# Patient Record
Sex: Male | Born: 1965 | Race: White | Hispanic: No | Smoking: Former smoker
Health system: Southern US, Community
[De-identification: ages and names within clinical notes are randomized; demographics above are authoritative.]

## PROBLEM LIST (undated history)

## (undated) DIAGNOSIS — F329 Major depressive disorder, single episode, unspecified: Secondary | ICD-10-CM

## (undated) DIAGNOSIS — E559 Vitamin D deficiency, unspecified: Secondary | ICD-10-CM

## (undated) DIAGNOSIS — K219 Gastro-esophageal reflux disease without esophagitis: Secondary | ICD-10-CM

## (undated) DIAGNOSIS — I10 Essential (primary) hypertension: Secondary | ICD-10-CM

## (undated) DIAGNOSIS — Z8719 Personal history of other diseases of the digestive system: Secondary | ICD-10-CM

## (undated) DIAGNOSIS — F32A Depression, unspecified: Secondary | ICD-10-CM

## (undated) DIAGNOSIS — N189 Chronic kidney disease, unspecified: Secondary | ICD-10-CM

## (undated) DIAGNOSIS — E785 Hyperlipidemia, unspecified: Secondary | ICD-10-CM

## (undated) DIAGNOSIS — K5900 Constipation, unspecified: Secondary | ICD-10-CM

## (undated) DIAGNOSIS — E119 Type 2 diabetes mellitus without complications: Secondary | ICD-10-CM

## (undated) DIAGNOSIS — J45909 Unspecified asthma, uncomplicated: Secondary | ICD-10-CM

## (undated) DIAGNOSIS — R809 Proteinuria, unspecified: Secondary | ICD-10-CM

## (undated) DIAGNOSIS — M109 Gout, unspecified: Secondary | ICD-10-CM

## (undated) DIAGNOSIS — R7303 Prediabetes: Secondary | ICD-10-CM

## (undated) DIAGNOSIS — R0602 Shortness of breath: Secondary | ICD-10-CM

## (undated) DIAGNOSIS — G4730 Sleep apnea, unspecified: Secondary | ICD-10-CM

## (undated) HISTORY — DX: Proteinuria, unspecified: R80.9

## (undated) HISTORY — DX: Hyperlipidemia, unspecified: E78.5

## (undated) HISTORY — PX: WISDOM TOOTH EXTRACTION: SHX21

## (undated) HISTORY — DX: Constipation, unspecified: K59.00

## (undated) HISTORY — DX: Gout, unspecified: M10.9

## (undated) HISTORY — DX: Depression, unspecified: F32.A

## (undated) HISTORY — DX: Personal history of other diseases of the digestive system: Z87.19

## (undated) HISTORY — DX: Major depressive disorder, single episode, unspecified: F32.9

## (undated) HISTORY — PX: POLYPECTOMY: SHX149

## (undated) HISTORY — DX: Essential (primary) hypertension: I10

## (undated) HISTORY — DX: Gastro-esophageal reflux disease without esophagitis: K21.9

## (undated) HISTORY — DX: Prediabetes: R73.03

## (undated) HISTORY — DX: Sleep apnea, unspecified: G47.30

## (undated) HISTORY — DX: Unspecified asthma, uncomplicated: J45.909

## (undated) HISTORY — DX: Type 2 diabetes mellitus without complications: E11.9

## (undated) HISTORY — DX: Vitamin D deficiency, unspecified: E55.9

## (undated) HISTORY — DX: Shortness of breath: R06.02

## (undated) HISTORY — DX: Chronic kidney disease, unspecified: N18.9

---

## 2003-10-13 DIAGNOSIS — R809 Proteinuria, unspecified: Secondary | ICD-10-CM

## 2003-10-13 HISTORY — DX: Proteinuria, unspecified: R80.9

## 2003-10-13 HISTORY — PX: RENAL BIOPSY: SHX156

## 2006-11-03 ENCOUNTER — Ambulatory Visit: Payer: Self-pay | Admitting: Internal Medicine

## 2006-11-03 LAB — CONVERTED CEMR LAB
Free T4: 0.8 ng/dL (ref 0.6–1.6)
Hgb A1c MFr Bld: 5.9 % (ref 4.6–6.0)
Potassium: 4.1 meq/L (ref 3.5–5.1)
T3, Free: 3 pg/mL (ref 2.3–4.2)

## 2007-04-19 ENCOUNTER — Ambulatory Visit: Payer: Self-pay | Admitting: Internal Medicine

## 2007-04-19 DIAGNOSIS — R350 Frequency of micturition: Secondary | ICD-10-CM | POA: Insufficient documentation

## 2007-04-19 DIAGNOSIS — K449 Diaphragmatic hernia without obstruction or gangrene: Secondary | ICD-10-CM | POA: Insufficient documentation

## 2007-04-22 ENCOUNTER — Encounter (INDEPENDENT_AMBULATORY_CARE_PROVIDER_SITE_OTHER): Payer: Self-pay | Admitting: *Deleted

## 2007-04-22 LAB — CONVERTED CEMR LAB
Creatinine, Ser: 1.5 mg/dL (ref 0.4–1.5)
Creatinine,U: 327.8 mg/dL

## 2008-03-29 ENCOUNTER — Ambulatory Visit: Payer: Self-pay | Admitting: Internal Medicine

## 2008-03-29 DIAGNOSIS — I10 Essential (primary) hypertension: Secondary | ICD-10-CM | POA: Insufficient documentation

## 2008-03-30 ENCOUNTER — Encounter (INDEPENDENT_AMBULATORY_CARE_PROVIDER_SITE_OTHER): Payer: Self-pay | Admitting: *Deleted

## 2008-10-09 ENCOUNTER — Ambulatory Visit: Payer: Self-pay | Admitting: Internal Medicine

## 2008-10-09 DIAGNOSIS — R809 Proteinuria, unspecified: Secondary | ICD-10-CM | POA: Insufficient documentation

## 2008-10-10 ENCOUNTER — Encounter (INDEPENDENT_AMBULATORY_CARE_PROVIDER_SITE_OTHER): Payer: Self-pay | Admitting: *Deleted

## 2008-10-10 ENCOUNTER — Encounter: Payer: Self-pay | Admitting: Internal Medicine

## 2008-10-10 ENCOUNTER — Telehealth (INDEPENDENT_AMBULATORY_CARE_PROVIDER_SITE_OTHER): Payer: Self-pay | Admitting: *Deleted

## 2008-10-10 LAB — CONVERTED CEMR LAB
BUN: 17 mg/dL (ref 6–23)
Hgb A1c MFr Bld: 5.9 % (ref 4.6–6.0)
Microalb Creat Ratio: 1309.6 mg/g — ABNORMAL HIGH (ref 0.0–30.0)
Microalb, Ur: 251.7 mg/dL — ABNORMAL HIGH (ref 0.0–1.9)

## 2008-10-16 ENCOUNTER — Encounter (INDEPENDENT_AMBULATORY_CARE_PROVIDER_SITE_OTHER): Payer: Self-pay | Admitting: *Deleted

## 2008-12-25 ENCOUNTER — Ambulatory Visit: Payer: Self-pay | Admitting: Internal Medicine

## 2009-01-10 ENCOUNTER — Ambulatory Visit: Payer: Self-pay | Admitting: Internal Medicine

## 2009-01-10 DIAGNOSIS — M109 Gout, unspecified: Secondary | ICD-10-CM | POA: Insufficient documentation

## 2009-01-15 ENCOUNTER — Encounter (INDEPENDENT_AMBULATORY_CARE_PROVIDER_SITE_OTHER): Payer: Self-pay | Admitting: *Deleted

## 2009-04-16 ENCOUNTER — Ambulatory Visit: Payer: Self-pay | Admitting: Internal Medicine

## 2009-04-16 ENCOUNTER — Ambulatory Visit: Payer: Self-pay

## 2009-04-16 DIAGNOSIS — S80929A Unspecified superficial injury of unspecified lower leg, initial encounter: Secondary | ICD-10-CM

## 2009-04-16 DIAGNOSIS — S70919A Unspecified superficial injury of unspecified hip, initial encounter: Secondary | ICD-10-CM | POA: Insufficient documentation

## 2009-04-16 DIAGNOSIS — S90919A Unspecified superficial injury of unspecified ankle, initial encounter: Secondary | ICD-10-CM

## 2009-04-16 DIAGNOSIS — S70929A Unspecified superficial injury of unspecified thigh, initial encounter: Secondary | ICD-10-CM

## 2009-04-18 ENCOUNTER — Encounter (INDEPENDENT_AMBULATORY_CARE_PROVIDER_SITE_OTHER): Payer: Self-pay | Admitting: *Deleted

## 2009-11-22 ENCOUNTER — Telehealth (INDEPENDENT_AMBULATORY_CARE_PROVIDER_SITE_OTHER): Payer: Self-pay | Admitting: *Deleted

## 2010-10-23 ENCOUNTER — Ambulatory Visit
Admission: RE | Admit: 2010-10-23 | Discharge: 2010-10-23 | Payer: Self-pay | Source: Home / Self Care | Attending: Internal Medicine | Admitting: Internal Medicine

## 2010-10-23 DIAGNOSIS — R3129 Other microscopic hematuria: Secondary | ICD-10-CM | POA: Insufficient documentation

## 2010-10-23 DIAGNOSIS — R109 Unspecified abdominal pain: Secondary | ICD-10-CM | POA: Insufficient documentation

## 2010-10-23 DIAGNOSIS — R361 Hematospermia: Secondary | ICD-10-CM | POA: Insufficient documentation

## 2010-10-23 LAB — CONVERTED CEMR LAB
Bilirubin Urine: NEGATIVE
Glucose, Urine, Semiquant: NEGATIVE
Ketones, urine, test strip: NEGATIVE
Nitrite: NEGATIVE
Protein, U semiquant: 100
Specific Gravity, Urine: 1.01
Urobilinogen, UA: 0.2
WBC Urine, dipstick: NEGATIVE
pH: 6

## 2010-10-24 ENCOUNTER — Encounter: Payer: Self-pay | Admitting: Internal Medicine

## 2010-10-27 ENCOUNTER — Telehealth: Payer: Self-pay | Admitting: Internal Medicine

## 2010-11-04 ENCOUNTER — Encounter: Payer: Self-pay | Admitting: Internal Medicine

## 2010-11-11 NOTE — Progress Notes (Signed)
Summary: Refill request  Phone Note Refill Request Call back at Work Phone [phone] Message from:  Patient  Refills Requested: Medication #1:  LISINOPRIL 40 MG TABS 1 qd  Medication #2:  AMLODIPINE BESYLATE 10 MG TABS (AMLODIPINE BESYLATE) 1 once daily. [facility]   Method Requested: Electronic Initial call taken by: Georgette Dover,  November 22, 2009 3:44 PM    Prescriptions: AMLODIPINE BESYLATE 10 MG TABS (AMLODIPINE BESYLATE) 1 once daily  #30 x 5   Entered by:   Georgette Dover   Authorized by:   Unice Cobble MD   Signed by:   Georgette Dover on 11/22/2009   Method used:   [facility]ed to ...       [facility]  [address] [phone]       [place], [place]  [address]       Ph: [phone] or [phone]       Fax: [phone]   RxID:   [id number] LISINOPRIL 40 MG TABS (LISINOPRIL) 1 qd  #30 x 5   Entered by:   Georgette Dover   Authorized by:   Unice Cobble MD   Signed by:   Georgette Dover on 11/22/2009   Method used:   [facility]ically [facility]        [facility]  [address] [id number]       [place], [place]  [address]       Ph: [phone] or [phone]       Fax: [phone]   RxID:   [id number]

## 2010-11-13 NOTE — Assessment & Plan Note (Signed)
Summary: HAVING  PAIN//PH   Vital Signs:  Patient profile:   45 year old male Weight:      212.2 pounds BMI:     30.13 Temp:     98.5 degrees F oral Pulse rate:   72 / minute Resp:     12 per minute BP sitting:   152 / 100  (left arm) Cuff size:   large  Vitals Entered By: Georgette Dover CMA (October 23, 2010 4:24 PM) CC: 1.) Pain in personal area and blood when ejaculating x couple days  2.) Refill Lisinopril, Abdominal pain   CC:  1.) Pain in personal area and blood when ejaculating x couple days  2.) Refill Lisinopril and Abdominal pain.  History of Present Illness:      This is a 45 year old man who presents with  onset of abdominal pain 3 days ago with eiaculation with frank  blood in semen. He had hematospermia several times in 2011, but  with much less blood.  The patient denies nausea, vomiting, diarrhea, constipation, melena, and hematochezia.  The location of the pain is right  and left lower quadrant.  The pain is described as  a dull  constant  pain , but  it was sharp with ejaculation.   Weight loss  of 20 # was with TLC ( [employer]). He also  has had slight  dysuria.  The patient denies the following symptoms: fever and dark urine.       See BP:  The patient reports urinary frequency, but denies lightheadedness, headaches, edema, and fatigue.  The patient denies the following associated symptoms: chest pain, chest pressure, exercise intolerance, dyspnea, palpitations, and syncope.  Compliance with medications (by patient report) has been near 100%.  The patient reports that dietary compliance has been excellent.  The patient reports exercising daily.  Adjunctive measures currently used by the patient include salt restriction.    Allergies (verified): No Known Drug Allergies  Review of Systems ENT:  Denies nosebleeds. Resp:  Denies coughing up blood. GU:  Denies discharge and hematuria. Heme:  Denies abnormal bruising and bleeding.  Physical Exam  General:   well-nourished,in no acute distress; alert,appropriate and cooperative throughout examination Eyes:  No corneal or conjunctival inflammation noted.No icterus Lungs:  Normal respiratory effort, chest expands symmetrically. Lungs are clear to auscultation, no crackles or wheezes. Heart:  Normal rate and regular rhythm. S1 and S2 normal without gallop, murmur, click, rub .S4 Abdomen:  Bowel sounds positive,abdomen soft and non-tender without masses, organomegaly or hernias noted. Rectal:  No external abnormalities noted. Normal sphincter tone. No rectal masses or tenderness. Genitalia:  Testes bilaterally descended without nodularity, tenderness or masses. No scrotal masses or lesions. No penis lesions or urethral discharge. Minor L varicocele.   Prostate:  Prostate gland firm and smooth, no enlargement, nodularity, tenderness, mass, asymmetry or induration. Pulses:  R and L carotid,radial,dorsalis pedis and posterior tibial pulses are full and equal bilaterally Extremities:  No clubbing, cyanosis, edema. Neurologic:  alert & oriented X3.   Skin:  Intact without suspicious lesions or rashes Cervical Nodes:  No lymphadenopathy noted Axillary Nodes:  No palpable lymphadenopathy Inguinal Nodes:  No significant adenopathy but tender bilaterally Psych:  memory intact for recent and remote, normally interactive, and good eye contact.     Impression & Recommendations:  Problem # 1:  ABDOMINAL PAIN (ICD-789.00)  BLQ  Orders: UA Dipstick w/o Micro (manual) [id number])  Problem # 2:  HEMATOSPERMIA [id number])  Orders:  UA Dipstick w/o Micro (manual) [phone]) Urology Referral (Urology)  Problem # 3:  HYPERTENSION, UNCONTROLLED (ICD-401.9)  His updated medication list for this problem includes:    Lisinopril 40 Mg Tabs (Lisinopril) .[name]... 1 qd    Amlodipine Besylate 5 Mg Tabs (Amlodipine besylate) .[name]... 1 once daily  Problem # 4:  FREQUENCY, URINARY (ICD-788.41)  Orders: UA Dipstick w/o Micro  (manual) (81002)  Problem # 5:  MICROSCOPIC HEMATURIA (ICD-599.72)  His updated medication list for this problem includes:    Ciprofloxacin Hcl 500 Mg Tabs (Ciprofloxacin hcl) .[name]... 1 two times a day  Orders: Urology Referral (Urology)  Complete Medication List: 1)  Fluoxetine Hcl 10 Mg Caps (Fluoxetine hcl) .[name].. 1 by mouth qd 2)  Advair Diskus 500-50 Mcg/dose Misc (Fluticasone-salmeterol) .[name].. 1 inhalation every 12 hrs 3)  Proair Hfa 108 (90 Base) Mcg/act Aers (Albuterol sulfate) .[name].. 1-2 puffs q 4-6 hrs prn 4)  Lisinopril 40 Mg Tabs (Lisinopril) .[name].. 1 qd 5)  Amlodipine Besylate 10 Mg Tabs (amlodipine Besylate)  .[name].. 1 once daily 6)  Ciprofloxacin Hcl 500 Mg Tabs (Ciprofloxacin hcl) .[name].. 1 two times a day 7)  Amlodipine Besylate 5 Mg Tabs (Amlodipine besylate) .[name].. 1 once daily  Other Orders: T-Culture, Urine [id number])  Patient Instructions: 1)  Drink as much fluid as you can tolerate for the next few days. 2)  Check your Blood Pressure regularly. If it is above: 135/85 ON AVERAGE  you should make an appointment. Prescriptions: AMLODIPINE BESYLATE 5 MG TABS (AMLODIPINE BESYLATE) 1 once daily  #30 x 5   Entered and Authorized by:   Unice Cobble MD   Signed by:   Unice Cobble MD on 10/23/2010   Method used:   Print then Give to Patient   RxID:   [id number] CIPROFLOXACIN HCL 500 MG TABS (CIPROFLOXACIN HCL) 1 two times a day  #14 x 0   Entered and Authorized by:   Unice Cobble MD   Signed by:   Unice Cobble MD on 10/23/2010   Method used:   Print then Give to Patient   RxID:   [id number] LISINOPRIL 40 MG TABS (LISINOPRIL) 1 qd  #90 x 3   Entered and Authorized by:   Unice Cobble MD   Signed by:   Unice Cobble MD on 10/23/2010   Method used:   Print then Give to Patient   RxID:   [id number]    Orders Added: 1)  Est. Patient Level IV [id number] 2)  UA Dipstick w/o Micro (manual) [81002] 3)  Urology Referral [Urology] 4)  T-Culture, Urine  [id number]    Laboratory Results   Urine Tests    Routine Urinalysis   Color: yellow Appearance: Clear Glucose: negative   (Normal Range: Negative) Bilirubin: negative   (Normal Range: Negative) Ketone: negative   (Normal Range: Negative) Spec. Gravity: 1.010   (Normal Range: 1.003-1.035) Blood: moderate   (Normal Range: Negative) pH: 6.0   (Normal Range: 5.0-8.0) Protein: 100   (Normal Range: Negative) Urobilinogen: 0.2   (Normal Range: 0-1) Nitrite: negative   (Normal Range: Negative) Leukocyte Esterace: negative   (Normal Range: Negative)    Comments: sent for culture

## 2010-11-13 NOTE — Progress Notes (Signed)
Summary: Culture Results/Status update  Phone Note Outgoing Call Call back at Home Phone [phone]   Call placed by: Georgette Dover CMA,  October 27, 2010 11:29 AM Call placed to: Patient Details for Reason: Culture Results Summary of Call: Left message on machine for patient to return call when avaliable, Reason for call:   no UTI is present; status update please [place]  October 27, 2010 11:30 AM   Follow-up for Phone Call        Left message on machine for patient to return call when avaliable, Reason for call:   Culture results Follow-up by: Georgette Dover CMA,  October 28, 2010 3:20 PM  Additional Follow-up for Phone Call Additional follow up Details #1::        Left message on machine for patient to return call when avaliable, Reason for call: Additional Follow-up by: Georgette Dover CMA,  October 29, 2010 8:02 AM    Additional Follow-up for Phone Call Additional follow up Details #2::    Returned call to patient [phone], left message on voicemail to call back to office. Also left message on machine at home to call back to office. Ernestene Mention CMA  October 29, 2010 3:35 PM   Patient notified and he is currently awaiting appt info from referral. Symptoms are present but at less intensity. Ernestene Mention CMA  October 29, 2010 3:38 PM   Additional Follow-up for Phone Call Additional follow up Details #3:: Details for Additional Follow-up Action Taken: Urology consult ordered & pending Additional Follow-up by: Unice Cobble MD,  October 30, 2010 6:23 AM

## 2010-11-27 NOTE — Consult Note (Signed)
Summary: [facility]  [facility]   Imported By: Edmonia James 11/17/2010 14:10:22  _____________________________________________________________________  External Attachment:    Type:   Image     Comment:   External Document

## 2011-05-11 ENCOUNTER — Other Ambulatory Visit: Payer: Self-pay | Admitting: Internal Medicine

## 2011-05-11 ENCOUNTER — Encounter: Payer: Self-pay | Admitting: Internal Medicine

## 2011-05-11 ENCOUNTER — Ambulatory Visit (HOSPITAL_BASED_OUTPATIENT_CLINIC_OR_DEPARTMENT_OTHER)
Admission: RE | Admit: 2011-05-11 | Discharge: 2011-05-11 | Disposition: A | Discharge status: Home / Self Care | Payer: BC Managed Care – PPO | Source: Ambulatory Visit | Attending: Internal Medicine | Admitting: Internal Medicine

## 2011-05-11 ENCOUNTER — Ambulatory Visit (INDEPENDENT_AMBULATORY_CARE_PROVIDER_SITE_OTHER): Payer: BC Managed Care – PPO | Admitting: Internal Medicine

## 2011-05-11 VITALS — BP 132/98 | HR 70 | Temp 99.3°F | Wt 218.6 lb

## 2011-05-11 DIAGNOSIS — M542 Cervicalgia: Secondary | ICD-10-CM | POA: Insufficient documentation

## 2011-05-11 DIAGNOSIS — M5412 Radiculopathy, cervical region: Secondary | ICD-10-CM

## 2011-05-11 DIAGNOSIS — M47812 Spondylosis without myelopathy or radiculopathy, cervical region: Secondary | ICD-10-CM | POA: Insufficient documentation

## 2011-05-11 DIAGNOSIS — R209 Unspecified disturbances of skin sensation: Secondary | ICD-10-CM | POA: Insufficient documentation

## 2011-05-11 DIAGNOSIS — M25519 Pain in unspecified shoulder: Secondary | ICD-10-CM

## 2011-05-11 MED ORDER — GABAPENTIN 100 MG PO CAPS: 100.0000 mg | ORAL_CAPSULE | ORAL | Status: DC

## 2011-05-11 MED ORDER — HYDROCODONE-ACETAMINOPHEN 7.5-500 MG PO TABS: 1.0000 | ORAL_TABLET | ORAL | Status: AC | PRN

## 2011-05-11 NOTE — Patient Instructions (Signed)
Order for x-rays entered into  the computer; these will be performed at [facility] [address] facility. No appointment is necessary.

## 2011-05-11 NOTE — Progress Notes (Signed)
  Subjective:    Patient ID: Frederick Fox, male    DOB: 1966/06/09, 45 y.o.   MRN: [id number]  HPI Extremity pain Location:L shoulder blade Onset:1 week ago Trigger/injury:NO Pain quality:burning , throbbing Pain severity:up to 10 Duration:constant Radiation:to 4th & 5th digits: Exacerbating factors:worse supine  Treatment/response:NSAIDS, hydrocodone only helped sleep Review of systems: Constitutional: no fever ( but see TEMP today), chills, sweats, change in weight  Musculoskeletal:no  muscle cramps or pain; no  joint stiffness, redness, or swelling Skin:no rash, color change Neuro: no :weakness; incontinence (stool/urine); numbness. Tingling in noted distribution Heme:no lymphadenopathy; abnormal bruising or bleeding       Review of Systems     Objective:   Physical Exam Gen.: Healthy and well-nourished in appearance. Alert, appropriate and cooperative throughout exam. Head: Normocephalic without obvious abnormalities Eyes: No corneal or conjunctival inflammation noted. Pupils equal round reactive to light and accommodation. Field of Vision WNL.  Extraocular motion intact.  Neck: No deformities, masses, or tenderness noted. Range of motion normal.  Lungs: Normal respiratory effort; chest expands symmetrically. Lungs are clear to auscultation without rales, wheezes, or increased work of breathing. Heart: Normal rate and rhythm. Normal S1 and S2. No gallop, click, or rub. S4 ; no  murmur.                                                                                    Musculoskeletal/extremities: No deformity or scoliosis noted of  the thoracic or lumbar spine but minimal asymmetry of thoracic muscles No clubbing, cyanosis, edema, or deformity noted. Range of motion decreased LUE due to pain with elevation .Tone & strength essentially  Normal;? Minimal weakness L 5th digit to opposition.Joints normal. Nail health  good. Vascular: Carotid, radial artery  pulses are full and  equal. No bruits present. Neurologic: Alert and oriented x3. Deep tendon reflexes symmetrical  But 0-1/2 +. Light touch is normal over the face and upper extremities. Gait(heel and toe), Romberg testing and finger-nose are all normal.          Skin: Intact without suspicious lesions or rashes. Lymph: No cervical, axillary  lymphadenopathy present. Psych: Mood and affect are normal. Normally interactive                                                                                        Assessment & Plan:  #1 cervical radiculopathy suggested on the left without demonstrable neurovascular deficit of significance  Plan: #1 cervical spine films  #2 gabapentin and pain medications.

## 2011-05-15 ENCOUNTER — Telehealth: Payer: Self-pay | Admitting: *Deleted

## 2011-05-15 NOTE — Telephone Encounter (Signed)
Message left for patient to return my call.  

## 2011-05-15 NOTE — Telephone Encounter (Signed)
Pt called and would like results from his Xray. States he is still having pain in shoulder area, difficult to sleep. Please advise.

## 2011-05-15 NOTE — Telephone Encounter (Signed)
Message copied by Charlies Silvers on Fri May 15, 2011 10:44 AM ------      Message from: Hendricks Limes      Created: Fri May 15, 2011 10:15 AM       Boney spuring is  present without evidence of narrowing the canal through which the nerve has no instability noted with flexion or extension of the neck. Physical therapy or chiropractor  recommended if symptoms persist or progress.

## 2011-05-15 NOTE — Telephone Encounter (Signed)
Spoke w/ pt. 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

## 2011-05-18 ENCOUNTER — Telehealth: Payer: Self-pay

## 2011-05-18 NOTE — Telephone Encounter (Signed)
Mssg from patient stating he needed to get the results of his X-ray since he is going to the Chirpractor this morning. I tried returning patient's call.Frederick KitchenMarland KitchenMssg left on cell      KP

## 2011-05-28 ENCOUNTER — Telehealth: Payer: Self-pay | Admitting: Internal Medicine

## 2011-05-28 DIAGNOSIS — M5412 Radiculopathy, cervical region: Secondary | ICD-10-CM

## 2011-05-28 NOTE — Telephone Encounter (Signed)
Pt returned phone call tried calling pt unable to reach left msg for pt to return call.

## 2011-05-28 NOTE — Telephone Encounter (Signed)
Pt called would like refill on hydrocodone last filled on 05/11/11 says he is still w/ arm pain and having trouble sleeping at night would like to know if Dr. Linna Darner has any further recommendations

## 2011-05-28 NOTE — Telephone Encounter (Signed)
Refill of hydrocodone #30 okay. He stated was going to see a Restaurant manager, [employer]. If he is no better with chiropractry,I recommend neurosurgical referral.

## 2011-05-28 NOTE — Telephone Encounter (Signed)
Left msg on voicemail for pt to return call .

## 2011-05-29 MED ORDER — HYDROCODONE-ACETAMINOPHEN 7.5-500 MG PO TABS: 1.0000 | ORAL_TABLET | ORAL | Status: AC | PRN

## 2011-05-29 NOTE — Telephone Encounter (Signed)
Spoke w/ pt says that arm isn't any better still w/ pain he also has been seeing a chiropractor with no improvement. Ok w/ neurosurgical referral aware med sent to pharmacy.

## 2011-06-09 NOTE — Telephone Encounter (Signed)
X-ray no longer needed, patient already seen    KP

## 2011-10-30 ENCOUNTER — Other Ambulatory Visit: Payer: Self-pay | Admitting: Internal Medicine

## 2011-10-30 MED ORDER — AMLODIPINE BESYLATE 10 MG PO TABS: 10.0000 mg | ORAL_TABLET | Freq: Every day | ORAL | Status: DC

## 2011-10-30 MED ORDER — LISINOPRIL 40 MG PO TABS: 40.0000 mg | ORAL_TABLET | Freq: Every day | ORAL | Status: DC

## 2011-10-30 NOTE — Telephone Encounter (Signed)
Rxs sent

## 2012-04-07 ENCOUNTER — Other Ambulatory Visit: Payer: Self-pay | Admitting: Neurosurgery

## 2012-04-07 DIAGNOSIS — M542 Cervicalgia: Secondary | ICD-10-CM

## 2012-04-13 ENCOUNTER — Ambulatory Visit
Admission: RE | Admit: 2012-04-13 | Discharge: 2012-04-13 | Disposition: A | Discharge status: Home / Self Care | Payer: BC Managed Care – PPO | Source: Ambulatory Visit | Attending: Neurosurgery | Admitting: Neurosurgery

## 2012-04-13 VITALS — BP 137/89 | HR 66

## 2012-04-13 DIAGNOSIS — M542 Cervicalgia: Secondary | ICD-10-CM

## 2012-04-13 MED ORDER — MEPERIDINE HCL 100 MG/ML IJ SOLN
75.0000 mg | Freq: Once | INTRAMUSCULAR | Status: AC
  Administered 2012-04-13: 75 mg via INTRAMUSCULAR

## 2012-04-13 MED ORDER — DIAZEPAM 5 MG PO TABS
10.0000 mg | ORAL_TABLET | Freq: Once | ORAL | Status: AC
  Administered 2012-04-13: 10 mg via ORAL

## 2012-04-13 MED ORDER — IOHEXOL 300 MG/ML  SOLN
10.0000 mL | Freq: Once | INTRAMUSCULAR | Status: AC | PRN
  Administered 2012-04-13: 10 mL via INTRATHECAL

## 2012-04-13 MED ORDER — ONDANSETRON HCL 4 MG/2ML IJ SOLN
4.0000 mg | Freq: Once | INTRAMUSCULAR | Status: AC
  Administered 2012-04-13: 4 mg via INTRAMUSCULAR

## 2012-04-13 NOTE — Discharge Instructions (Signed)

## 2012-04-13 NOTE — Progress Notes (Signed)
Pt c/o of back and leg pain and meds were given

## 2012-04-18 ENCOUNTER — Telehealth: Payer: Self-pay | Admitting: Radiology

## 2012-04-18 NOTE — Telephone Encounter (Signed)
Pt c/o some pressure in back of his head and slight nausea. Asked pt to stay down and drink lots of fluids today and that should take care of this. Pt did not c/o positional headache at this time.

## 2012-10-14 ENCOUNTER — Encounter: Payer: Self-pay | Admitting: Lab

## 2012-10-17 ENCOUNTER — Ambulatory Visit (INDEPENDENT_AMBULATORY_CARE_PROVIDER_SITE_OTHER): Payer: BC Managed Care – PPO | Admitting: Internal Medicine

## 2012-10-17 ENCOUNTER — Encounter: Payer: Self-pay | Admitting: Internal Medicine

## 2012-10-17 VITALS — BP 144/98 | HR 98 | Temp 98.7°F | Wt 237.6 lb

## 2012-10-17 DIAGNOSIS — T464X5A Adverse effect of angiotensin-converting-enzyme inhibitors, initial encounter: Secondary | ICD-10-CM

## 2012-10-17 DIAGNOSIS — R058 Other specified cough: Secondary | ICD-10-CM

## 2012-10-17 DIAGNOSIS — R05 Cough: Secondary | ICD-10-CM

## 2012-10-17 DIAGNOSIS — R059 Cough, unspecified: Secondary | ICD-10-CM

## 2012-10-17 DIAGNOSIS — T44905A Adverse effect of unspecified drugs primarily affecting the autonomic nervous system, initial encounter: Secondary | ICD-10-CM

## 2012-10-17 DIAGNOSIS — I10 Essential (primary) hypertension: Secondary | ICD-10-CM

## 2012-10-17 MED ORDER — LOSARTAN POTASSIUM 100 MG PO TABS: 100.0000 mg | ORAL_TABLET | Freq: Every day | ORAL | Status: DC

## 2012-10-17 NOTE — Progress Notes (Signed)
  Subjective:    Patient ID: Frederick Fox, male    DOB: 1966-05-25, 47 y.o.   MRN: [id number]  HPI The  symptoms began pre Thanksgiving as sore throat , fever & dry cough . The cough has persisted Cough is not associated with shortness of breath but there is some wheezing Treatment with  Delsym, albuterol  & Coricidin HP were partially effective.  There is  history of asthma. The patient  Smokes a rare cigar    Review of Systems  Symptoms not present included frontal headache, facial pain, dental pain,  nasal purulence, earache , and otic discharge Itchy , watery eyes & sneezing were not noted. No GERD symptoms present                    Objective:   Physical Exam General appearance:well nourished; no acute distress or increased work of breathing is present.  No  lymphadenopathy about the head, neck, or axilla noted.  Eyes: No conjunctival inflammation or lid edema is present.  Ears:  External ear exam shows no significant lesions or deformities.  Otoscopic examination reveals wax on  R. Nose:  External nasal examination shows no deformity or inflammation. Nasal mucosa are pink and moist without lesions or exudates. No septal dislocation or deviation.No obstruction to airflow.  Oral exam: Dental hygiene is good; lips and gums are healthy appearing.There is no oropharyngeal erythema or exudate noted.  Neck:  No deformities, thyromegaly, masses, or tenderness noted.   Supple with full range of motion without pain.  Heart:  Normal rate and regular rhythm. S1 and S2 normal without gallop, murmur, click, rub or other extra sounds.  Lungs:Chest clear to auscultation; no wheezes, rhonchi,rales ,or rubs present.No increased work of breathing.   Extremities:  No cyanosis or clubbing  noted . Trace ankle edema Skin: Warm & dry         Assessment & Plan:  #1 cough , probably ACE-I induced #2 HTN Plan: See orders and recommendations

## 2012-10-17 NOTE — Patient Instructions (Addendum)
Stay well hydrated. Drink to thirst up to 32 ounces of fluids daily.  Blood Pressure Goal = AVERAGE < 140/90; Ideal is an AVERAGE < 135/85. This AVERAGE should be calculated from @ least 5-7 BP readings taken @ different times of day on different days of week. You should not respond to isolated BP readings , but rather the AVERAGE for that week

## 2012-12-19 ENCOUNTER — Ambulatory Visit (INDEPENDENT_AMBULATORY_CARE_PROVIDER_SITE_OTHER): Payer: BC Managed Care – PPO | Admitting: Internal Medicine

## 2012-12-19 ENCOUNTER — Encounter: Payer: Self-pay | Admitting: Internal Medicine

## 2012-12-19 VITALS — BP 148/90 | HR 99 | Temp 98.1°F | Wt 229.0 lb

## 2012-12-19 DIAGNOSIS — L738 Other specified follicular disorders: Secondary | ICD-10-CM

## 2012-12-19 DIAGNOSIS — N529 Male erectile dysfunction, unspecified: Secondary | ICD-10-CM

## 2012-12-19 DIAGNOSIS — L739 Follicular disorder, unspecified: Secondary | ICD-10-CM

## 2012-12-19 DIAGNOSIS — L678 Other hair color and hair shaft abnormalities: Secondary | ICD-10-CM

## 2012-12-19 LAB — HEMOGLOBIN A1C: Hgb A1c MFr Bld: 5.7 % (ref 4.6–6.5)

## 2012-12-19 MED ORDER — DOXYCYCLINE HYCLATE 100 MG PO TABS: 100.0000 mg | ORAL_TABLET | Freq: Two times a day (BID) | ORAL | Status: DC

## 2012-12-19 MED ORDER — SILDENAFIL CITRATE 100 MG PO TABS: 50.0000 mg | ORAL_TABLET | Freq: Every day | ORAL | Status: DC | PRN

## 2012-12-19 NOTE — Progress Notes (Signed)
  Subjective:    Patient ID: Frederick Fox, male    DOB: [birth year], 47 y.o.   MRN: [id number]  HPI  Over the last 2 weeks he's had diffuse papular/pustular lesions over his anterior thighs and to a lesser extent over the dorsum of the hands. He's had no definite pet or travel exposures of significance. He denies associated fever, chills, or sweats. There is no personal family history of MRSA    Review of Systems He recently has noted erectile dysfunction without associated decreased libido or profound weakness. He was questioning relationship to medications. He is on ARB and a calcium channel blocker which is a vasodilator.  BP @ home averages 136-140/86-90.     Objective:   Physical Exam   He appears healthy and well-nourished  Has no lymphadenopathy about the neck, axilla, or inguinal areas  Abdomen reveals no organomegaly or masses  Genital exam unremarkable except for varices on the left.  He has diffuse folliculitis type lesions over the anterior thigh and minimally over the dorsum of the hands       Assessment & Plan:  #1 folliculitis type lesions  #2 erectile dysfunction with no decrease in libido or strength  Plan: See orders recommendations

## 2012-12-19 NOTE — Patient Instructions (Addendum)
If you activate the  My Chart system; lab & Xray results will be released directly  to you as soon as I review & address these through the computer. As per [place] all records must be reviewed and signed off within 72 hours; but I attempt to complete this within 36 hours unless I have no access to the electronic medical record.If I wait more than 36 hours the volume of reports becomes difficult to manage optimally. In my absence ;my partners would address the results.Critical lab results will be communicated to you ASAP. If you choose not to sign up for My Chart within 36 hours of labs being drawn; results will be reviewed & interpretation added before being copied & mailed, causing a delay in getting the results to you.If you do not receive that report within 7-10 days ,please call. Additionally you can use this system to gain direct  access to your records  if  out of town or @ an office of a  physician who is not in  the My Chart network.  This improves continuity of care & places you in control of your medical record.

## 2012-12-26 ENCOUNTER — Other Ambulatory Visit: Payer: Self-pay | Admitting: Internal Medicine

## 2012-12-26 DIAGNOSIS — N529 Male erectile dysfunction, unspecified: Secondary | ICD-10-CM

## 2012-12-27 MED ORDER — SILDENAFIL CITRATE 100 MG PO TABS: 50.0000 mg | ORAL_TABLET | Freq: Every day | ORAL | Status: DC | PRN

## 2013-04-24 ENCOUNTER — Encounter: Payer: Self-pay | Admitting: Internal Medicine

## 2013-04-24 ENCOUNTER — Ambulatory Visit (INDEPENDENT_AMBULATORY_CARE_PROVIDER_SITE_OTHER): Payer: BC Managed Care – PPO | Admitting: Internal Medicine

## 2013-04-24 VITALS — BP 150/100 | HR 90 | Temp 98.1°F | Wt 216.0 lb

## 2013-04-24 DIAGNOSIS — N4281 Prostatodynia syndrome: Secondary | ICD-10-CM

## 2013-04-24 DIAGNOSIS — N4289 Other specified disorders of prostate: Secondary | ICD-10-CM

## 2013-04-24 DIAGNOSIS — R319 Hematuria, unspecified: Secondary | ICD-10-CM

## 2013-04-24 DIAGNOSIS — R300 Dysuria: Secondary | ICD-10-CM

## 2013-04-24 LAB — CBC WITH DIFFERENTIAL/PLATELET
Eosinophils Relative: 1.5 % (ref 0.0–5.0)
Monocytes Relative: 6.5 % (ref 3.0–12.0)
Neutrophils Relative %: 73 % (ref 43.0–77.0)
Platelets: 226 10*3/uL (ref 150.0–400.0)
WBC: 10.1 10*3/uL (ref 4.5–10.5)

## 2013-04-24 LAB — PSA: PSA: 1.15 ng/mL (ref 0.10–4.00)

## 2013-04-24 MED ORDER — CIPROFLOXACIN HCL 500 MG PO TABS: 500.0000 mg | ORAL_TABLET | Freq: Two times a day (BID) | ORAL | Status: DC

## 2013-04-24 NOTE — Progress Notes (Signed)
  Subjective:    Patient ID: Frederick Fox, male    DOB: Jul 31, 1966, 47 y.o.   MRN: [id number]  HPI   Symptoms began 04/22/13 as pain on intercourse with associated dysuria and hematuria.  Since 7/12 he's continued to have minor fissure area and intermittent scant hematuria.  He has no history of genitourinary issues.  He's had no fever, chills, sweats,  or pyuria. Weight loss is related to and exercise and nutrition program    Review of Systems  He denies epistaxis, hemoptysis,  melena, or rectal bleeding.He has no  dysphagia, or abdominal pain. He has no abnormal bruising or bleeding. He has no difficulty stopping bleeding with injury.      Objective:   Physical Exam General appearance is one of good health and nourishment w/o distress.  Eyes: No conjunctival inflammation or scleral icterus is present.  Oral exam: Dental hygiene is good; lips and gums are healthy appearing.There is no oropharyngeal erythema or exudate noted.   Heart:  Normal rate and regular rhythm. S1 and S2 normal without gallop, murmur, click, rub or other extra sounds     Lungs:Chest clear to auscultation; no wheezes, rhonchi,rales ,or rubs present.No increased work of breathing.   Abdomen: bowel sounds normal, soft and non-tender without masses, organomegaly or hernias noted.  No guarding or rebound . No discomfort with percussion over the flanks  Skin:Warm & dry.  Intact without suspicious lesions or rashes ; no jaundice or tenting  Lymphatic: No lymphadenopathy is noted about the head, neck, axilla, or inguinal areas.   Genitourinary exam reveals variceal on the left and epididymal granuloma formation. Prostate is firm and tender to palpation            Assessment & Plan:  #1 hematuria and dysuria following intercourse  #2 clinical prostatitis  Plan: See orders and recommendations

## 2013-04-24 NOTE — Patient Instructions (Addendum)
Drink as much nondairy fluids as possible. Avoid spicy foods or alcohol as  these may aggravate the prostate. Do not take decongestants. Avoid narcotics if possible.

## 2013-04-27 LAB — POCT URINALYSIS DIPSTICK
Bilirubin, UA: NEGATIVE
Glucose, UA: NEGATIVE
Nitrite, UA: NEGATIVE
Spec Grav, UA: 1.015
Urobilinogen, UA: 0.2

## 2013-08-17 ENCOUNTER — Encounter: Payer: Self-pay | Admitting: Internal Medicine

## 2013-08-17 ENCOUNTER — Other Ambulatory Visit: Payer: Self-pay | Admitting: Internal Medicine

## 2013-08-17 ENCOUNTER — Other Ambulatory Visit: Payer: Self-pay | Admitting: *Deleted

## 2013-08-17 ENCOUNTER — Other Ambulatory Visit: Payer: Self-pay

## 2013-08-17 MED ORDER — AMLODIPINE BESYLATE 10 MG PO TABS: ORAL_TABLET | ORAL | Status: DC

## 2013-08-17 NOTE — Telephone Encounter (Signed)
Amlodipine refill sent to pharmacy 

## 2013-08-22 ENCOUNTER — Encounter: Payer: Self-pay | Admitting: Internal Medicine

## 2013-08-22 ENCOUNTER — Ambulatory Visit (INDEPENDENT_AMBULATORY_CARE_PROVIDER_SITE_OTHER): Payer: BC Managed Care – PPO | Admitting: Internal Medicine

## 2013-08-22 ENCOUNTER — Other Ambulatory Visit: Payer: Self-pay | Admitting: Internal Medicine

## 2013-08-22 VITALS — BP 162/110 | HR 84 | Temp 98.5°F | Wt 205.6 lb

## 2013-08-22 DIAGNOSIS — F329 Major depressive disorder, single episode, unspecified: Secondary | ICD-10-CM

## 2013-08-22 DIAGNOSIS — I10 Essential (primary) hypertension: Secondary | ICD-10-CM

## 2013-08-22 DIAGNOSIS — F32A Depression, unspecified: Secondary | ICD-10-CM | POA: Insufficient documentation

## 2013-08-22 DIAGNOSIS — F339 Major depressive disorder, recurrent, unspecified: Secondary | ICD-10-CM | POA: Insufficient documentation

## 2013-08-22 DIAGNOSIS — R7989 Other specified abnormal findings of blood chemistry: Secondary | ICD-10-CM | POA: Insufficient documentation

## 2013-08-22 DIAGNOSIS — F3289 Other specified depressive episodes: Secondary | ICD-10-CM

## 2013-08-22 LAB — BASIC METABOLIC PANEL
CO2: 30 mEq/L (ref 19–32)
Chloride: 100 mEq/L (ref 96–112)
Glucose, Bld: 96 mg/dL (ref 70–99)
Potassium: 4.1 mEq/L (ref 3.5–5.1)
Sodium: 137 mEq/L (ref 135–145)

## 2013-08-22 MED ORDER — SPIRONOLACTONE 25 MG PO TABS: 25.0000 mg | ORAL_TABLET | Freq: Every day | ORAL | Status: DC

## 2013-08-22 MED ORDER — METOPROLOL TARTRATE 25 MG PO TABS: 25.0000 mg | ORAL_TABLET | Freq: Two times a day (BID) | ORAL | Status: DC

## 2013-08-22 NOTE — Assessment & Plan Note (Addendum)
Renin levels ; EKG; BMET Add Spironolactone if average> 140/90. HCTZ not an option due to gout hx

## 2013-08-22 NOTE — Progress Notes (Signed)
Pre visit review using our clinic review tool, if applicable. No additional management support is needed unless otherwise documented below in the visit note. 

## 2013-08-22 NOTE — Assessment & Plan Note (Signed)
As per Psychiatrist All options reviewed

## 2013-08-22 NOTE — Progress Notes (Signed)
  Subjective:    Patient ID: Frederick Fox, male    DOB: 08-20-1966, 47 y.o.   MRN: [id number]  HPI Blood pressure range 130/80-209/160 over past month. Typical average 130/80.The 209 was recorded via wrist cuff @ Psychiatrist's office. He denies any triggers such as increased salt intake or decongestant ingestion.  Compliant with both anti hypertemsive medications. No lightheadedness or other adverse medication effect described.        Review of Systems Significant headaches, epistaxis, chest pain, palpitations, exertional dyspnea, claudication, paroxysmal nocturnal dyspnea, or edema absent. He has occasional occipital discomfort from cervical spine issues  which responds to Tylenol.  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      Objective:   Physical Exam   Gen.: well-nourished in appearance. Alert, appropriate and cooperative throughout exam.Appears younger than stated age  Head: Normocephalic without obvious abnormalities. Head shaven Eyes: No corneal or conjunctival inflammation noted. Pupils equal round reactive to light and accommodation. Extraocular motion intact. Fundal exam reveals arteriolar narrowing without hemorrhages, exudate, papilledema.  No lid lag or proptosis Neck: No deformities, masses, or tenderness noted. Range of motion & Thyroid normal. Lungs: Normal respiratory effort; chest expands symmetrically. Lungs are clear to auscultation without rales, wheezes, or increased work of breathing. Heart: Normal rate and rhythm. Normal S1 and S2. No gallop, click, or rub. No murmur. Abdomen: Bowel sounds normal; abdomen soft and nontender. No masses, organomegaly or hernias noted. No AAA                                Musculoskeletal/extremities: No deformity or scoliosis noted of  the thoracic or lumbar spine.    No clubbing, cyanosis, edema, or significant extremity  deformity noted. Range of motion normal .Tone & strength normal. Hand joints normal .  Fingernail  health good. No onycholysis Able to lie down & sit up w/o help.  Vascular: Carotid, radial artery, dorsalis pedis and  posterior tibial pulses are full and equal. No bruits present. Neurologic: Alert and oriented x3. Deep tendon reflexes symmetrical but 1/2+. Slight asymmetry of the nasolabial folds. No cranial nerve deficit. No tremor     Skin: Intact without suspicious lesions or rashes. Lymph: No cervical, axillary lymphadenopathy present. Psych: Mood and affect are normal. Normally interactive  and communicative                                                                                     Assessment & Plan:  See Current Assessment & Plan in Problem List under specific Diagnosis

## 2013-08-22 NOTE — Patient Instructions (Signed)

## 2013-08-26 LAB — ALDOSTERONE + RENIN ACTIVITY W/ RATIO
ALDO / PRA Ratio: 0.9 Ratio (ref 0.9–28.9)
Aldosterone: 7 ng/dL

## 2013-08-27 ENCOUNTER — Other Ambulatory Visit: Payer: Self-pay | Admitting: Internal Medicine

## 2013-08-27 DIAGNOSIS — I10 Essential (primary) hypertension: Secondary | ICD-10-CM

## 2013-08-28 ENCOUNTER — Other Ambulatory Visit: Payer: Self-pay | Admitting: *Deleted

## 2013-08-28 DIAGNOSIS — R809 Proteinuria, unspecified: Secondary | ICD-10-CM

## 2013-09-20 ENCOUNTER — Inpatient Hospital Stay: Admission: RE | Admit: 2013-09-20 | Payer: BC Managed Care – PPO | Source: Ambulatory Visit

## 2013-09-20 ENCOUNTER — Other Ambulatory Visit: Payer: BC Managed Care – PPO

## 2013-11-07 ENCOUNTER — Ambulatory Visit (INDEPENDENT_AMBULATORY_CARE_PROVIDER_SITE_OTHER): Payer: BC Managed Care – PPO | Admitting: Internal Medicine

## 2013-11-07 ENCOUNTER — Encounter: Payer: Self-pay | Admitting: Internal Medicine

## 2013-11-07 VITALS — BP 186/111 | HR 69 | Temp 98.7°F | Wt 216.4 lb

## 2013-11-07 DIAGNOSIS — M7710 Lateral epicondylitis, unspecified elbow: Secondary | ICD-10-CM

## 2013-11-07 DIAGNOSIS — L659 Nonscarring hair loss, unspecified: Secondary | ICD-10-CM

## 2013-11-07 DIAGNOSIS — G44309 Post-traumatic headache, unspecified, not intractable: Secondary | ICD-10-CM

## 2013-11-07 DIAGNOSIS — I10 Essential (primary) hypertension: Secondary | ICD-10-CM

## 2013-11-07 MED ORDER — TRAMADOL HCL 50 MG PO TABS: 50.0000 mg | ORAL_TABLET | Freq: Four times a day (QID) | ORAL | Status: DC | PRN

## 2013-11-07 NOTE — Progress Notes (Signed)
   Subjective:    Patient ID: Frederick Fox, male    DOB: 1965-12-08, 48 y.o.   MRN: [id number]  HPI  He was struck over the anterior crown 11/05/13 at approximately 10 AM by a piece of furniture, specifically a leaf from the table. There was no loss of consciousness. He took 2 ibuprofen at that time and again that evening. He continues to have intermittent dull anterior frontal pain reesponsive to NSAIDS  2 every 6 hrs.  He has had nausea 1/26 & lightheadedness today.  BP 130/80 @ home. PMH of mgraines    Review of Systems    He's had no associated loss of consciousness since the event or seizure activity.  He has no neurologic deficits such as limb numbness, tingling, weakness  He also denies any incontinence of urine or stool.   He has loss of L forearm hair.TSH therapeutic in 11/14.  For 2 weeks he has pain L elbow lifting cases of wine.     Objective:   Physical Exam Gen.: Healthy and well-nourished in appearance. Alert, appropriate and cooperative throughout exam.Appears younger than stated age  Head: Normocephalic without obvious abnormalities;  alopecia  Eyes: No corneal or conjunctival inflammation noted. Pupils equal round reactive to light and accommodation. Extraocular motion intact. FOV Ears: External  ear exam reveals no significant lesions or deformities. Wax bilaterally. Hearing is grossly normal bilaterally.Tuning fork exam normal Nose: External nasal exam reveals no deformity or inflammation. Nasal mucosa are pink and moist. No lesions or exudates noted.   Mouth: Oral mucosa and oropharynx reveal no lesions or exudates. Teeth in good repair. Neck: No deformities, masses, or tenderness noted. Range of motion & Thyroid normal. Lungs: Normal respiratory effort; chest expands symmetrically. Lungs are clear to auscultation without rales, wheezes, or increased work of breathing.BP 160/80. Heart: Normal rate and rhythm. Normal S1 and S2. No gallop, click, or rub. No  murmur.Recheck BP 160/80.                                  Musculoskeletal/extremities: No deformity or scoliosis noted of  the thoracic or lumbar spine.   No clubbing, cyanosis, edema, or significant extremity  deformity noted. Range of motion normal .Tone & strength normal. Hand joints normal . Fingernail  health good. Classic thenar cellulitis of the left elbow exhibited with hand grip and pronation.  Vascular: Carotid, radial artery pulses WNL. Neurologic: Alert and oriented x3. Deep tendon reflexes symmetrical and normal.  Gait normal  .   No cranial nerve deficit present.     Skin: Intact without suspicious lesions or rashes.  Over the forearms appears essentially normal. Lymph: No cervical, axillaryl lymphadenopathy present. Psych: Mood and affect are normal. Normally interactive                                                                                          Assessment & Plan:  #1 daily headaches #2 post head trauma #3 elbow tenosynovitis  #4 hair loss left forearm, questionable etiology  See orders

## 2013-11-07 NOTE — Progress Notes (Signed)
Pre visit review using our clinic review tool, if applicable. No additional management support is needed unless otherwise documented below in the visit note. 

## 2013-11-07 NOTE — Patient Instructions (Signed)
Please keep a diary of your headaches . Document  each occurrence on the calendar with notation of : #1 any prodrome ( any non headache symptom such as marked fatigue,visual changes, ,etc ) which precedes actual headache ; #2) severity on 1-10 scale; #3) any triggers ( food/ drink,enviromenntal or weather changes ,physical or emotional stress) in 8-12 hour period prior to the headache; & #4) response to any medications or other intervention. Please review "Headache" @ [facility] for additional information.   Minimal Blood Pressure Goal= AVERAGE < 140/90;  Ideal is an AVERAGE < 135/85. This AVERAGE should be calculated from @ least 5-7 BP readings taken @ different times of day on different days of week. You should not respond to isolated BP readings , but rather the AVERAGE for that week .Please bring your  blood pressure cuff to office visits to verify that it is reliable.It  can also be checked against the blood pressure device at the pharmacy. Finger or wrist cuffs are not dependable; an arm cuff is. Perform the exercises for elbow pain  twice a day as discussed. Apply the anti-inflammatory gel after the exercises such as Aspercreme or Zostrix cream twice a day to the affected area as needed. In lieu of this warm moist compresses or  hot water bottle can be used. Do not apply ice .

## 2013-11-08 ENCOUNTER — Telehealth: Payer: Self-pay | Admitting: Internal Medicine

## 2013-11-08 NOTE — Telephone Encounter (Signed)
Relevant patient education assigned to patient using Emmi. ° °

## 2013-11-14 ENCOUNTER — Telehealth: Payer: Self-pay | Admitting: Internal Medicine

## 2013-11-14 NOTE — Telephone Encounter (Signed)
Relevant patient education assigned to patient using Emmi. ° °

## 2014-02-12 ENCOUNTER — Ambulatory Visit: Payer: BC Managed Care – PPO | Admitting: Internal Medicine

## 2014-02-12 DIAGNOSIS — R4689 Other symptoms and signs involving appearance and behavior: Secondary | ICD-10-CM | POA: Insufficient documentation

## 2014-02-12 DIAGNOSIS — Z0289 Encounter for other administrative examinations: Secondary | ICD-10-CM

## 2014-08-06 ENCOUNTER — Encounter: Payer: Self-pay | Admitting: Family

## 2014-08-06 ENCOUNTER — Ambulatory Visit (INDEPENDENT_AMBULATORY_CARE_PROVIDER_SITE_OTHER): Payer: BC Managed Care – PPO | Admitting: Family

## 2014-08-06 VITALS — BP 200/110 | HR 71 | Temp 98.4°F | Resp 18 | Ht 70.0 in | Wt 216.6 lb

## 2014-08-06 DIAGNOSIS — I10 Essential (primary) hypertension: Secondary | ICD-10-CM | POA: Diagnosis not present

## 2014-08-06 DIAGNOSIS — J209 Acute bronchitis, unspecified: Secondary | ICD-10-CM | POA: Insufficient documentation

## 2014-08-06 MED ORDER — LOSARTAN POTASSIUM 100 MG PO TABS: 100.0000 mg | ORAL_TABLET | Freq: Every day | ORAL | Status: DC

## 2014-08-06 MED ORDER — METOPROLOL TARTRATE 25 MG PO TABS: 25.0000 mg | ORAL_TABLET | Freq: Two times a day (BID) | ORAL | Status: DC

## 2014-08-06 MED ORDER — AMLODIPINE BESYLATE 10 MG PO TABS: ORAL_TABLET | ORAL | Status: DC

## 2014-08-06 MED ORDER — HYDROCODONE-HOMATROPINE 5-1.5 MG/5ML PO SYRP: 5.0000 mL | ORAL_SOLUTION | Freq: Three times a day (TID) | ORAL | Status: DC | PRN

## 2014-08-06 MED ORDER — AZITHROMYCIN 250 MG PO TABS: ORAL_TABLET | ORAL | Status: DC

## 2014-08-06 NOTE — Patient Instructions (Signed)
Thank you for choosing [facility].  Summary/Instructions:   Please take your blood pressure medicaitons  Your prescription has been sent to your pharmacy.  Please follow up if symptoms worsen or fail to improve.   Acute Bronchitis Bronchitis is inflammation of the airways that extend from the windpipe into the lungs (bronchi). The inflammation often causes mucus to develop. This leads to a cough, which is the most common symptom of bronchitis.  In acute bronchitis, the condition usually develops suddenly and goes away over time, usually in a couple weeks. Smoking, allergies, and asthma can make bronchitis worse. Repeated episodes of bronchitis may cause further lung problems.  CAUSES Acute bronchitis is most often caused by the same virus that causes a cold. The virus can spread from person to person (contagious) through coughing, sneezing, and touching contaminated objects. SIGNS AND SYMPTOMS   Cough.   Fever.   Coughing up mucus.   Body aches.   Chest congestion.   Chills.   Shortness of breath.   Sore throat.  DIAGNOSIS  Acute bronchitis is usually diagnosed through a physical exam. Your health care provider will also ask you questions about your medical history. Tests, such as chest X-rays, are sometimes done to rule out other conditions.  TREATMENT  Acute bronchitis usually goes away in a couple weeks. Oftentimes, no medical treatment is necessary. Medicines are sometimes given for relief of fever or cough. Antibiotic medicines are usually not needed but may be prescribed in certain situations. In some cases, an inhaler may be recommended to help reduce shortness of breath and control the cough. A cool mist vaporizer may also be used to help thin bronchial secretions and make it easier to clear the chest.  HOME CARE INSTRUCTIONS  Get plenty of rest.   Drink enough fluids to keep your urine clear or pale yellow (unless you have a medical condition that  requires fluid restriction). Increasing fluids may help thin your respiratory secretions (sputum) and reduce chest congestion, and it will prevent dehydration.   Take medicines only as directed by your health care provider.  If you were prescribed an antibiotic medicine, finish it all even if you start to feel better.  Avoid smoking and secondhand smoke. Exposure to cigarette smoke or irritating chemicals will make bronchitis worse. If you are a smoker, consider using nicotine gum or skin patches to help control withdrawal symptoms. Quitting smoking will help your lungs heal faster.   Reduce the chances of another bout of acute bronchitis by washing your hands frequently, avoiding people with cold symptoms, and trying not to touch your hands to your mouth, nose, or eyes.   Keep all follow-up visits as directed by your health care provider.  SEEK MEDICAL CARE IF: Your symptoms do not improve after 1 week of treatment.  SEEK IMMEDIATE MEDICAL CARE IF:  You develop an increased fever or chills.   You have chest pain.   You have severe shortness of breath.  You have bloody sputum.   You develop dehydration.  You faint or repeatedly feel like you are going to pass out.  You develop repeated vomiting.  You develop a severe headache. MAKE SURE YOU:   Understand these instructions.  Will watch your condition.  Will get help right away if you are not doing well or get worse. Document Released: 11/05/2004 Document Revised: 02/12/2014 Document Reviewed: 03/21/2013 [facility] Patient Information 2015 [place], [place]. This information is not intended to replace advice given to you by your health care  provider. Make sure you discuss any questions you have with your health care provider.

## 2014-08-06 NOTE — Progress Notes (Signed)
   Subjective:    Patient ID: Frederick Fox, male    DOB: [birth year], 48 y.o.   MRN: [id number]  Chief Complaint  Patient presents with  . Cough    x3 weeks, chest pain from cough   HPI:  Frederick Fox is a 48 y.o. male who presents today for a cough.   1) Cough - Been going on for about 3 weeks with episodes that wax and wane. Chest and back hurt from coughing. Has had productive cough at times and some general congestion. Tried OTC Robotussin, but has not taken any Mucinex. Over the past 3 weeks the cough has stayed about the same. Denies any fevers, chills, sore throat or sinus pressure.   Allergies  Allergen Reactions  . Lisinopril     cough   Current Outpatient Prescriptions on File Prior to Visit  Medication Sig Dispense Refill  . albuterol (PROAIR HFA) 108 (90 BASE) MCG/ACT inhaler Inhale 1-2 puffs into the lungs every 6 (six) hours as needed.        [name] escitalopram (LEXAPRO) 20 MG tablet Take 20 mg by mouth daily.      . Fluticasone-Salmeterol (ADVAIR DISKUS) 500-50 MCG/DOSE AEPB Inhale 1 puff into the lungs every 12 (twelve) hours.        . traMADol (ULTRAM) 50 MG tablet Take 1 tablet (50 mg total) by mouth every 6 (six) hours as needed.  30 tablet  1   No current facility-administered medications on file prior to visit.   Review of Systems    See HPI Objective:    BP 208/130  Pulse 71  Temp(Src) 98.4 F (36.9 C) (Oral)  Resp 18  Ht 5\' 10"  (1.778 m)  Wt 216 lb 9.6 oz (98.249 kg)  BMI 31.08 kg/m2  SpO2 98% Nursing note and vital signs reviewed.  Physical Exam  Constitutional: He is oriented to person, place, and time. He appears well-developed and well-nourished. No distress.  HENT:  Right Ear: Hearing and external ear normal.  Left Ear: Hearing, tympanic membrane, external ear and ear canal normal.  Nose: Nose normal.  Mouth/Throat: Uvula is midline and mucous membranes are normal. Posterior oropharyngeal erythema present.  Right TM not visible  secondary to impacted cerumen.   Cardiovascular: Normal rate, regular rhythm and normal heart sounds.   Pulmonary/Chest: Effort normal. He has wheezes (Occasional - has and uses his albuterol).  Lymphadenopathy:    He has cervical adenopathy.  Neurological: He is alert and oriented to person, place, and time.  Skin: Skin is warm and dry.  Psychiatric: He has a normal mood and affect. His behavior is normal. Judgment and thought content normal.       Assessment & Plan:

## 2014-08-06 NOTE — Assessment & Plan Note (Addendum)
Symptoms consistent with bronchitis. Given small amount of lymphadenopathy, will start Azithromycin. Start Hycodan for cough suppresion. Continue drinking plenty of fluids - use OTC medications as needed. Instructed to avoid all products with pseudoephedrine or -D. Follow if symptoms worsen or fail to improve.

## 2014-08-06 NOTE — Progress Notes (Signed)
Pre visit review using our clinic review tool, if applicable. No additional management support is needed unless otherwise documented below in the visit note. 

## 2014-08-06 NOTE — Assessment & Plan Note (Signed)
Currently not taking his blood pressure medications. Discussed the importance of taking medication to reduce risk for cardiovascular disease, stroke, renal impairment and visual impairment. Refilled amlodipine, metoprolol and losartan. Follow up in 2 weeks to establish blood pressure control.

## 2015-01-24 ENCOUNTER — Encounter: Payer: Self-pay | Admitting: Internal Medicine

## 2015-01-29 ENCOUNTER — Encounter: Payer: Self-pay | Admitting: Internal Medicine

## 2015-01-29 ENCOUNTER — Ambulatory Visit (INDEPENDENT_AMBULATORY_CARE_PROVIDER_SITE_OTHER): Payer: BC Managed Care – PPO | Admitting: Internal Medicine

## 2015-01-29 ENCOUNTER — Other Ambulatory Visit (INDEPENDENT_AMBULATORY_CARE_PROVIDER_SITE_OTHER): Payer: BC Managed Care – PPO

## 2015-01-29 VITALS — BP 170/110 | HR 82 | Temp 98.4°F | Ht 70.0 in | Wt 217.0 lb

## 2015-01-29 DIAGNOSIS — F329 Major depressive disorder, single episode, unspecified: Secondary | ICD-10-CM

## 2015-01-29 DIAGNOSIS — R748 Abnormal levels of other serum enzymes: Secondary | ICD-10-CM | POA: Diagnosis not present

## 2015-01-29 DIAGNOSIS — F32A Depression, unspecified: Secondary | ICD-10-CM

## 2015-01-29 DIAGNOSIS — Z87448 Personal history of other diseases of urinary system: Secondary | ICD-10-CM

## 2015-01-29 DIAGNOSIS — R7989 Other specified abnormal findings of blood chemistry: Secondary | ICD-10-CM

## 2015-01-29 DIAGNOSIS — I10 Essential (primary) hypertension: Secondary | ICD-10-CM

## 2015-01-29 LAB — BASIC METABOLIC PANEL
BUN: 27 mg/dL — ABNORMAL HIGH (ref 6–23)
CALCIUM: 9.1 mg/dL (ref 8.4–10.5)
CHLORIDE: 102 meq/L (ref 96–112)
CO2: 33 meq/L — AB (ref 19–32)
Creatinine, Ser: 2.31 mg/dL — ABNORMAL HIGH (ref 0.40–1.50)
GFR: 32.31 mL/min — ABNORMAL LOW (ref >60.00)
GLUCOSE: 114 mg/dL — AB (ref 70–99)
POTASSIUM: 3.9 meq/L (ref 3.5–5.1)
SODIUM: 139 meq/L (ref 135–145)

## 2015-01-29 MED ORDER — FLUOXETINE HCL 20 MG PO TABS: 20.0000 mg | ORAL_TABLET | Freq: Every day | ORAL | Status: DC

## 2015-01-29 MED ORDER — LOSARTAN POTASSIUM 100 MG PO TABS: 100.0000 mg | ORAL_TABLET | Freq: Every day | ORAL | Status: DC

## 2015-01-29 MED ORDER — METOPROLOL TARTRATE 25 MG PO TABS: 25.0000 mg | ORAL_TABLET | Freq: Two times a day (BID) | ORAL | Status: DC

## 2015-01-29 NOTE — Progress Notes (Signed)
Pre visit review using our clinic review tool, if applicable. No additional management support is needed unless otherwise documented below in the visit note. 

## 2015-01-29 NOTE — Patient Instructions (Signed)
Minimal Blood Pressure Goal= AVERAGE < 140/90;  Ideal is an AVERAGE < 135/85. This AVERAGE should be calculated from @ least 5-7 BP readings taken @ different times of day on different days of week. You should not respond to isolated BP readings , but rather the AVERAGE for that week .Please bring your  blood pressure cuff to office visits to verify that it is reliable.It  can also be checked against the blood pressure device at the pharmacy. Finger or wrist cuffs are not dependable; an arm cuff is. Your next office appointment will be determined based upon review of your pending labs . Those instructions will be transmitted to you by My Chart Critical results will be called.  Followup as needed for any active or acute issue. Please report any significant change in your symptoms.

## 2015-01-29 NOTE — Progress Notes (Signed)
   Subjective:    Patient ID: Frederick Fox, male    DOB: 01-01-1966, 49 y.o.   MRN: [id number]  HPI He describes increasing irritability associated with some anxiety and difficulty triaging. He feels he's having some depression as well. He wanted to restart fluoxetine which had been of benefit in the past  He states that he has been compliant with his amlodipine as well as losartan. Blood pressure at home averages 160/90. He does restrict sodium. In the last 2 weeks he began exercising on the treadmill and with weights for up to one hour 6 days a week. He denies any associated cardio pulmonary symptoms  There is no family history of stroke or heart attack. HTN in both parents.  He has not had renal function checked since 2014. At that time his creatinine was 1.7. GFR was 47. PMH of proteinuria; S/P renal biopsy in 2005.  Review of Systems  Chest pain, palpitations, tachycardia, exertional dyspnea, paroxysmal nocturnal dyspnea, claudication or edema are absent.  He has some EIB symptoms with wheezing post intercourse.      Objective:   Physical Exam  Arteriolar narrowing is present on fundal exam. Appears healthy and well-nourished & in no acute distress.BMI: 31.14 Arteriolar narrowing is present on fundal exam. No carotid bruits are present.No neck vein distention present at 10 - 15 degrees. Thyroid normal to palpation Heart rhythm and rate are normal with no gallop or murmur.S1 & S2 accentuated. Chest is clear with no increased work of breathing There is no evidence of aortic aneurysm or renal artery bruits Abdomen protuberant but soft with no organomegaly or masses. No HJR No clubbing, cyanosis or edema present. Pedal pulses are intact  No ischemic skin changes are present . Fingernails healthy  Alert and oriented. Strength, tone, DTRs reflexes normal. Somewhat frustrated as he discuses issues.       Assessment & Plan:  #1 hypertension, uncontrolled.    #2 elevated  creatinine  #3 serotonin deficiency manifested as irritability, anxiety, inability to triage as well as some depression.  Plan: See orders and recommendations

## 2015-01-30 ENCOUNTER — Encounter: Payer: Self-pay | Admitting: Internal Medicine

## 2015-01-30 MED ORDER — FLUOXETINE HCL 20 MG PO TABS: 20.0000 mg | ORAL_TABLET | Freq: Every day | ORAL | Status: DC

## 2015-01-31 ENCOUNTER — Other Ambulatory Visit: Payer: Self-pay | Admitting: Internal Medicine

## 2015-01-31 DIAGNOSIS — R739 Hyperglycemia, unspecified: Secondary | ICD-10-CM | POA: Insufficient documentation

## 2015-01-31 DIAGNOSIS — I10 Essential (primary) hypertension: Secondary | ICD-10-CM

## 2015-01-31 DIAGNOSIS — N183 Chronic kidney disease, stage 3 unspecified: Secondary | ICD-10-CM

## 2015-01-31 DIAGNOSIS — N184 Chronic kidney disease, stage 4 (severe): Secondary | ICD-10-CM | POA: Insufficient documentation

## 2015-02-05 ENCOUNTER — Other Ambulatory Visit: Payer: Self-pay

## 2015-02-05 MED ORDER — FLUOXETINE HCL 20 MG PO TABS: 20.0000 mg | ORAL_TABLET | Freq: Every day | ORAL | Status: DC

## 2015-05-15 ENCOUNTER — Other Ambulatory Visit: Payer: Self-pay | Admitting: Nephrology

## 2015-05-15 DIAGNOSIS — R160 Hepatomegaly, not elsewhere classified: Secondary | ICD-10-CM

## 2015-05-15 DIAGNOSIS — N183 Chronic kidney disease, stage 3 (moderate): Secondary | ICD-10-CM

## 2015-05-16 ENCOUNTER — Telehealth: Payer: Self-pay | Admitting: *Deleted

## 2015-05-16 NOTE — Telephone Encounter (Signed)
Left msg on triage yesterday evening (Wed) Dr. Mercy Moore is looking for a renal biopsy that was done in between 2004-2006. Called Mechele Claude back this am there is no renal biopsy in pt chart. It states that he had biopsy done @ [facility] back in 2005, but we do not have report...Johny Chess

## 2015-06-11 ENCOUNTER — Other Ambulatory Visit: Payer: BC Managed Care – PPO

## 2015-06-11 ENCOUNTER — Other Ambulatory Visit: Payer: Self-pay | Admitting: Nephrology

## 2015-06-11 ENCOUNTER — Ambulatory Visit
Admission: RE | Admit: 2015-06-11 | Discharge: 2015-06-11 | Disposition: A | Discharge status: Home / Self Care | Payer: BC Managed Care – PPO | Source: Ambulatory Visit | Attending: Nephrology | Admitting: Nephrology

## 2015-06-11 DIAGNOSIS — R160 Hepatomegaly, not elsewhere classified: Secondary | ICD-10-CM

## 2015-06-11 DIAGNOSIS — N183 Chronic kidney disease, stage 3 (moderate): Secondary | ICD-10-CM

## 2015-07-23 ENCOUNTER — Encounter: Payer: Self-pay | Admitting: Internal Medicine

## 2015-08-13 ENCOUNTER — Other Ambulatory Visit: Payer: Self-pay | Admitting: Internal Medicine

## 2015-09-20 ENCOUNTER — Encounter: Payer: Self-pay | Admitting: Internal Medicine

## 2015-09-20 ENCOUNTER — Ambulatory Visit (INDEPENDENT_AMBULATORY_CARE_PROVIDER_SITE_OTHER): Payer: BC Managed Care – PPO | Admitting: Internal Medicine

## 2015-09-20 ENCOUNTER — Other Ambulatory Visit (INDEPENDENT_AMBULATORY_CARE_PROVIDER_SITE_OTHER): Payer: BC Managed Care – PPO

## 2015-09-20 VITALS — BP 148/102 | HR 70 | Temp 98.6°F | Ht 70.0 in | Wt 227.0 lb

## 2015-09-20 DIAGNOSIS — Z8489 Family history of other specified conditions: Secondary | ICD-10-CM

## 2015-09-20 DIAGNOSIS — M79642 Pain in left hand: Secondary | ICD-10-CM

## 2015-09-20 DIAGNOSIS — L570 Actinic keratosis: Secondary | ICD-10-CM

## 2015-09-20 DIAGNOSIS — I10 Essential (primary) hypertension: Secondary | ICD-10-CM

## 2015-09-20 DIAGNOSIS — Z87898 Personal history of other specified conditions: Secondary | ICD-10-CM

## 2015-09-20 LAB — BASIC METABOLIC PANEL
BUN: 26 mg/dL — ABNORMAL HIGH (ref 6–23)
CHLORIDE: 105 meq/L (ref 96–112)
CO2: 30 meq/L (ref 19–32)
Calcium: 9.2 mg/dL (ref 8.4–10.5)
Creatinine, Ser: 2.1 mg/dL — ABNORMAL HIGH (ref 0.40–1.50)
GFR: 35.97 mL/min — ABNORMAL LOW (ref >60.00)
Glucose, Bld: 99 mg/dL (ref 70–99)
POTASSIUM: 4.7 meq/L (ref 3.5–5.1)
SODIUM: 141 meq/L (ref 135–145)

## 2015-09-20 LAB — MAGNESIUM: MAGNESIUM: 2.2 mg/dL (ref 1.5–2.5)

## 2015-09-20 NOTE — Progress Notes (Signed)
   Subjective:    Patient ID: Frederick Fox, male    DOB: Jan 26, 1966, 49 y.o.   MRN: [id number]  HPI His girlfriend states he snores significantly despite changing position.  He also describes cramping in the left hand. He states that @ times he even must stop writing. He does use a computer extensively at school.  He's also concerned about possible lesions over the forehead being malignant.  His blood pressure at home typically is in the 140/ 90-100 range. His nephrologist is adjusting his blood pressure medicines. He has not had renal function checked for 2 months.  Review of Systems   He denies definite apnea. Chest pain, palpitations, tachycardia, exertional dyspnea, paroxysmal nocturnal dyspnea, claudication or edema are absent. No unexplained weight loss, abdominal pain, significant dyspepsia, dysphagia, melena, rectal bleeding, or persistently small caliber stools. No bowel changes of constipation or diarrhea. No intolerance to heat or cold.      Objective:   Physical Exam  Pertinent or positive findings include: Pattern alopecia is present. He has a punctate keratotic lesion over the right anterior forehead. The oropharynx is crowded. The Tinel's sign in the left wrist is equivocally positive.Knee DTRs 0-1/2+  General appearance :adequately nourished; in no distress.  Eyes: No conjunctival inflammation or scleral icterus is present.  Oral exam:  Lips and gums are healthy appearing.There is no oropharyngeal erythema or exudate noted. Dental hygiene is good.  Heart:  Normal rate and regular rhythm. S1 and S2 normal without gallop, murmur, click, rub or other extra sounds    Lungs:Chest clear to auscultation; no wheezes, rhonchi,rales ,or rubs present.No increased work of breathing.   Abdomen: bowel sounds normal, soft and non-tender without masses, organomegaly or hernias noted.  No guarding or rebound.   Vascular : all pulses equal ; no bruits present.  Skin:Warm &  dry.  Intact without suspicious lesions or rashes ; no tenting or jaundice   Lymphatic: No lymphadenopathy is noted about the head, neck, axilla.   Neuro: Strength, tone  normal.     Assessment & Plan:  #1 snoring; rule out sleep apnea  #2 benign keratotic lesion of the forehead  #3 hand cramping; rule out a electrolyte imbalance. Differential would include carpal tunnel syndrome.  #4 hypertension in the setting of renal disease ;med adjustment as per nephrologist  See orders

## 2015-09-20 NOTE — Progress Notes (Signed)
Pre visit review using our clinic review tool, if applicable. No additional management support is needed unless otherwise documented below in the visit note. 

## 2015-09-20 NOTE — Patient Instructions (Signed)
Go to [facility] for information on Carpal Tunnel Syndrome. Employ a wrist support cushion when using the computer. Wear a wrist splint  as needed.  If symptoms persist or progress; nerve conduction/EMG studies would be indicated. If these were abnormal, Hand Surgery referral would be indicated.  Use  Aveeno Daily  Moisturizing Lotion  twice a day  for the skin lesion. Bathe with moisturizing liquid soap , not bar soap.

## 2015-09-23 LAB — TSH: TSH: 1.74 u[IU]/mL (ref 0.35–4.50)

## 2015-10-09 ENCOUNTER — Encounter: Payer: Self-pay | Admitting: Pulmonary Disease

## 2015-12-19 ENCOUNTER — Telehealth: Payer: Self-pay

## 2015-12-19 NOTE — Telephone Encounter (Signed)
Called patient left vm. Patient has not scheduled with a new provider at this point,. Advised that we cannot RX unless he sees someone or makes an appt.

## 2015-12-19 NOTE — Telephone Encounter (Signed)
Pt lm on triage rq tamiflu.  Previous pt of [facility]. Pt informed via message on voicemail that he must have a visit for this medication.

## 2016-01-02 ENCOUNTER — Institutional Professional Consult (permissible substitution): Payer: BC Managed Care – PPO | Admitting: Pulmonary Disease

## 2016-01-21 ENCOUNTER — Telehealth: Payer: Self-pay | Admitting: Internal Medicine

## 2016-01-21 ENCOUNTER — Other Ambulatory Visit: Payer: Self-pay

## 2016-01-21 ENCOUNTER — Ambulatory Visit (INDEPENDENT_AMBULATORY_CARE_PROVIDER_SITE_OTHER): Payer: BC Managed Care – PPO | Admitting: Pulmonary Disease

## 2016-01-21 ENCOUNTER — Encounter: Payer: Self-pay | Admitting: Pulmonary Disease

## 2016-01-21 VITALS — BP 150/90 | HR 78 | Ht 70.0 in | Wt 218.4 lb

## 2016-01-21 DIAGNOSIS — R0683 Snoring: Secondary | ICD-10-CM | POA: Diagnosis not present

## 2016-01-21 DIAGNOSIS — G4733 Obstructive sleep apnea (adult) (pediatric): Secondary | ICD-10-CM | POA: Diagnosis not present

## 2016-01-21 MED ORDER — FLUOXETINE HCL 20 MG PO TABS: 20.0000 mg | ORAL_TABLET | Freq: Every day | ORAL | Status: DC

## 2016-01-21 MED ORDER — LOSARTAN POTASSIUM-HCTZ 100-25 MG PO TABS: 1.0000 | ORAL_TABLET | Freq: Every day | ORAL | Status: DC

## 2016-01-21 NOTE — Telephone Encounter (Signed)
prozac and losartan sent to [facility]

## 2016-01-21 NOTE — Progress Notes (Signed)
Subjective:    Patient ID: Frederick Fox, male    DOB: 08/26/1966, 50 y.o.   MRN: [id number]  HPI  Chief Complaint  Patient presents with  . Sleep Consult    Referred by Dr. Linna Darner; Snoring. 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 His wife has noted loud snoring, but not witnessed apneas. He denies choking or gasping episodes during his sleep. He denies excessive daytime somnolence or fatigue Epworth sleepiness score is 2 He denies daytime naps. Bedtime is between 10 and 11 PM, sleep latency is minimal, he sleeps on his side with one pillow has one nocturnal awakening for bathroom visit and is out of bed by 5:15 AM feeling rested without dryness of mouth or headaches. He has always been an early blurred He denies excessive use of caffeinated beverages, he will have a can soda at lunch   There is no history suggestive of cataplexy, sleep paralysis or parasomnias There is no history of recent weight gain  He reports exercise-induced asthma but has not used Advair in years. Lately he has not needed albuterol much at all He uses Prozac for depression   Past Medical History  Diagnosis Date  . Hypertension   . Proteinuria 2005  . Depression     Past Surgical History  Procedure Laterality Date  . Renal biopsy  2005    protien in urine, no pathology- nephrologist in [place]    Allergies  Allergen Reactions  . Lisinopril     cough    Social History   Social History  . Marital Status: Legally Separated    Spouse Name: N/A  . Number of Children: N/A  . Years of Education: N/A   Occupational History  . Not on file.   Social History Main Topics  . Smoking status: Current Some Day Smoker    Types: Cigars  . Smokeless tobacco: Not on file  . Alcohol Use: Yes  . Drug Use: No  . Sexual Activity: Not on file   Other Topics Concern  . Not on file   Social History Narrative   Low salt diet             Family History  Problem Relation Age of Onset  . Hypertension Father   . Hypertension Mother      Review of Systems  Constitutional: Negative for fever, chills, activity change, appetite change and unexpected weight change.  HENT: Negative for congestion, dental problem, postnasal drip, rhinorrhea, sneezing, sore throat, trouble swallowing and voice change.   Eyes: Negative for visual disturbance.  Respiratory: Negative for cough, choking and shortness of breath.   Cardiovascular: Negative for chest pain and leg swelling.  Gastrointestinal: Negative for nausea, vomiting and abdominal pain.  Genitourinary: Negative for difficulty urinating.  Musculoskeletal: Negative for arthralgias.  Skin: Negative for rash.  Psychiatric/Behavioral: Negative for behavioral problems and confusion.       Objective:   Physical Exam  Gen. Pleasant, obese, in no distress, normal affect ENT - no lesions, no post nasal drip, class 2-3 airway Neck: No JVD, no thyromegaly, no carotid bruits Lungs: no use of accessory muscles, no dullness to percussion, decreased without rales or rhonchi  Cardiovascular: Rhythm regular, heart sounds  normal, no murmurs or gallops, no peripheral edema Abdomen: soft and non-tender, no hepatosplenomegaly, BS normal. Musculoskeletal: No deformities, no cyanosis or clubbing Neuro:  alert, non focal, no tremors       Assessment & Plan:

## 2016-01-21 NOTE — Patient Instructions (Signed)
Home sleep study 

## 2016-01-21 NOTE — Assessment & Plan Note (Signed)
Given  narrow pharyngeal exam & loud snoring, obstructive sleep apnea is possible & an overnight polysomnogram will be scheduled as a home study. The pathophysiology of obstructive sleep apnea , it's cardiovascular consequences & modes of treatment including CPAP were discused with the patient in detail & they evidenced understanding.  Pretest probability is low If he ends up being a simple snorer-he may not need therapy

## 2016-01-21 NOTE — Telephone Encounter (Signed)
Pt request refill for FLUoxetine (PROZAC) 20 MG tablet and losartan to be send to [facility]. Please help, pt has an appt with Dr. Quay Burow on 03/2016.

## 2016-01-28 ENCOUNTER — Telehealth: Payer: Self-pay | Admitting: *Deleted

## 2016-01-28 DIAGNOSIS — G4733 Obstructive sleep apnea (adult) (pediatric): Secondary | ICD-10-CM

## 2016-01-28 NOTE — Telephone Encounter (Signed)
Split night study ordered. Nothing further needed. 

## 2016-01-28 NOTE — Telephone Encounter (Signed)
-----   Message from Rigoberto Noel, MD sent at 01/28/2016  3:23 PM EDT ----- Sharyn Lull, Pl order split study ----- Message -----    From: Joellen Jersey    Sent: 01/27/2016   9:06 AM      To: Rigoberto Noel, MD, Glean Hess, CMA  bcbs denied hst need an order for in lab study if pt is ok with this thanks libby

## 2016-03-18 ENCOUNTER — Ambulatory Visit (HOSPITAL_BASED_OUTPATIENT_CLINIC_OR_DEPARTMENT_OTHER): Payer: BC Managed Care – PPO | Attending: Pulmonary Disease | Admitting: Pulmonary Disease

## 2016-03-18 VITALS — Ht 70.0 in | Wt 212.0 lb

## 2016-03-18 DIAGNOSIS — R0683 Snoring: Secondary | ICD-10-CM | POA: Insufficient documentation

## 2016-03-18 DIAGNOSIS — Z79899 Other long term (current) drug therapy: Secondary | ICD-10-CM | POA: Insufficient documentation

## 2016-03-18 DIAGNOSIS — G4733 Obstructive sleep apnea (adult) (pediatric): Secondary | ICD-10-CM | POA: Diagnosis not present

## 2016-03-19 ENCOUNTER — Telehealth: Payer: Self-pay | Admitting: Pulmonary Disease

## 2016-03-19 DIAGNOSIS — G4733 Obstructive sleep apnea (adult) (pediatric): Secondary | ICD-10-CM

## 2016-03-19 NOTE — Telephone Encounter (Signed)
PSG did show mod OSA AHI 19/h, corrcted by CPAP  If willing,s end rx for  CPAP therapy on 9 cm H2O with a Medium size Fisher&Paykel Full Face Mask Simplus mask and heated humidification.  DL in 4 wks OV in 6

## 2016-03-19 NOTE — Telephone Encounter (Signed)
Left message for patient to call back  

## 2016-03-19 NOTE — Procedures (Signed)
Patient Name: Frederick Fox, Frederick Fox Date: 03/18/2016 Gender: Male D.O.B: 11/06/1965 Age (years): 40 Referring Provider: Kara Mead MD, ABSM Height (inches): 70 Interpreting Physician: Kara Mead MD, ABSM Weight (lbs): 212 RPSGT: Gerhard Perches BMI: 30 MRN: [id number] Neck Size: 16.00   CLINICAL INFORMATION Sleep Study Type: Split Night CPAP Indication for sleep study: OSA, Snoring Epworth Sleepiness Score: 4   SLEEP STUDY TECHNIQUE As per the AASM Manual for the Scoring of Sleep and Associated Events v2.3 (April 2016) with a hypopnea requiring 4% desaturations. The channels recorded and monitored were frontal, central and occipital EEG, electrooculogram (EOG), submentalis EMG (chin), nasal and oral airflow, thoracic and abdominal wall motion, anterior tibialis EMG, snore microphone, electrocardiogram, and pulse oximetry. Continuous positive airway pressure (CPAP) was initiated when the patient met split night criteria and was titrated according to treat sleep-disordered breathing.   MEDICATIONS Medications taken by the patient : ALLEGRA, LOSARTAN HCTZ, FLUOXETINE  Medications administered by patient during sleep study : No sleep medicine administered.   RESPIRATORY PARAMETERS Diagnostic Total AHI (/hr): 19.5 RDI (/hr): 22.2 OA Index (/hr): - CA Index (/hr): 0.0 REM AHI (/hr): N/A NREM AHI (/hr): 19.5 Supine AHI (/hr): 19.5 Non-supine AHI (/hr): N/A Min O2 Sat (%): 84.00 Mean O2 (%): 93.54 Time below 88% (min): 5.4   Titration Optimal Pressure (cm): 9 AHI at Optimal Pressure (/hr): 0.0 Min O2 at Optimal Pressure (%): 92.0 Supine % at Optimal (%): 100 Sleep % at Optimal (%): 94     SLEEP ARCHITECTURE The recording time for the entire night was 395.6 minutes. During a baseline period of 180.0 minutes, the patient slept for 172.6 minutes in REM and nonREM, yielding a sleep efficiency of 95.9%. Sleep onset after lights out was 5.9 minutes with a REM latency of N/A minutes.  The patient spent 1.74% of the night in stage N1 sleep, 91.60% in stage N2 sleep, 6.66% in stage N3 and 0.00% in REM. During the titration period of 210.3 minutes, the patient slept for 142.7 minutes in REM and nonREM, yielding a sleep efficiency of 67.9%. Sleep onset after CPAP initiation was 63.0 minutes with a REM latency of 53.5 minutes. The patient spent 3.50% of the night in stage N1 sleep, 47.10% in stage N2 sleep, 0.00% in stage N3 and 49.39% in REM.   CARDIAC DATA The 2 lead EKG demonstrated sinus rhythm. The mean heart rate was 61.06 beats per minute. Other EKG findings include: None.   LEG MOVEMENT DATA The total Periodic Limb Movements of Sleep (PLMS) were 4. The PLMS index was 0.76 .   IMPRESSIONS Moderate obstructive sleep apnea occurred during the diagnostic portion of the study(AHI = 19.5/hour). An optimal PAP pressure was selected for this patient ( 9 cm of water) No significant central sleep apnea occurred during the diagnostic portion of the study (CAI = 0.0/hour). The patient had minimal or no oxygen desaturation during the diagnostic portion of the study (Min O2 = 84.00%) The patient snored with Moderate snoring volume during the diagnostic portion of the study. No cardiac abnormalities were noted during this study. Clinically significant periodic limb movements did not occur during sleep.   DIAGNOSIS Obstructive Sleep Apnea (327.23 [G47.33 ICD-10])   RECOMMENDATIONS Trial of CPAP therapy on 9 cm H2O with a Medium size Fisher&Paykel Full Face Mask Simplus mask and heated humidification. Avoid alcohol, sedatives and other CNS depressants that may worsen sleep apnea and disrupt normal sleep architecture. Sleep hygiene should be reviewed to assess factors that may improve sleep  quality. Weight management and regular exercise should be initiated or continued. Return to [facility] for re-evaluation after 4 weeks of therapy    Kara Mead MD. [facility]. [place]  [facility]   03/19/2016

## 2016-03-23 DIAGNOSIS — G4710 Hypersomnia, unspecified: Secondary | ICD-10-CM | POA: Diagnosis not present

## 2016-03-24 ENCOUNTER — Encounter: Payer: Self-pay | Admitting: Internal Medicine

## 2016-03-24 ENCOUNTER — Other Ambulatory Visit (INDEPENDENT_AMBULATORY_CARE_PROVIDER_SITE_OTHER): Payer: BC Managed Care – PPO

## 2016-03-24 ENCOUNTER — Ambulatory Visit (INDEPENDENT_AMBULATORY_CARE_PROVIDER_SITE_OTHER): Payer: BC Managed Care – PPO | Admitting: Internal Medicine

## 2016-03-24 VITALS — BP 162/114 | HR 72 | Temp 98.5°F | Resp 18 | Wt 223.0 lb

## 2016-03-24 DIAGNOSIS — J45990 Exercise induced bronchospasm: Secondary | ICD-10-CM | POA: Diagnosis not present

## 2016-03-24 DIAGNOSIS — K449 Diaphragmatic hernia without obstruction or gangrene: Secondary | ICD-10-CM | POA: Diagnosis not present

## 2016-03-24 DIAGNOSIS — I10 Essential (primary) hypertension: Secondary | ICD-10-CM

## 2016-03-24 DIAGNOSIS — N183 Chronic kidney disease, stage 3 unspecified: Secondary | ICD-10-CM

## 2016-03-24 DIAGNOSIS — N189 Chronic kidney disease, unspecified: Secondary | ICD-10-CM

## 2016-03-24 DIAGNOSIS — R739 Hyperglycemia, unspecified: Secondary | ICD-10-CM

## 2016-03-24 DIAGNOSIS — L259 Unspecified contact dermatitis, unspecified cause: Secondary | ICD-10-CM

## 2016-03-24 DIAGNOSIS — J45909 Unspecified asthma, uncomplicated: Secondary | ICD-10-CM | POA: Insufficient documentation

## 2016-03-24 DIAGNOSIS — F329 Major depressive disorder, single episode, unspecified: Secondary | ICD-10-CM

## 2016-03-24 DIAGNOSIS — F32A Depression, unspecified: Secondary | ICD-10-CM

## 2016-03-24 LAB — COMPREHENSIVE METABOLIC PANEL
ALBUMIN: 4.2 g/dL (ref 3.5–5.2)
ALK PHOS: 69 U/L (ref 39–117)
ALT: 13 U/L (ref 0–53)
AST: 15 U/L (ref 0–37)
BUN: 40 mg/dL — ABNORMAL HIGH (ref 6–23)
CALCIUM: 8.9 mg/dL (ref 8.4–10.5)
CHLORIDE: 99 meq/L (ref 96–112)
CO2: 31 mEq/L (ref 19–32)
CREATININE: 2.19 mg/dL — AB (ref 0.40–1.50)
GFR: 34.2 mL/min — ABNORMAL LOW (ref >60.00)
Glucose, Bld: 109 mg/dL — ABNORMAL HIGH (ref 70–99)
Potassium: 3.5 mEq/L (ref 3.5–5.1)
Sodium: 136 mEq/L (ref 135–145)
TOTAL PROTEIN: 7.1 g/dL (ref 6.0–8.3)
Total Bilirubin: 0.3 mg/dL (ref 0.2–1.2)

## 2016-03-24 LAB — HEMOGLOBIN A1C: HEMOGLOBIN A1C: 5.9 % (ref 4.6–6.5)

## 2016-03-24 MED ORDER — FLUTICASONE PROPIONATE 50 MCG/ACT NA SUSP: 2.0000 | Freq: Every day | NASAL | Status: DC

## 2016-03-24 MED ORDER — FLUOXETINE HCL 20 MG PO TABS: 20.0000 mg | ORAL_TABLET | Freq: Every day | ORAL | Status: DC

## 2016-03-24 MED ORDER — RANITIDINE HCL 150 MG PO CAPS: 150.0000 mg | ORAL_CAPSULE | Freq: Every evening | ORAL | Status: DC

## 2016-03-24 MED ORDER — TRIAMCINOLONE ACETONIDE 0.5 % EX OINT: 1.0000 "application " | TOPICAL_OINTMENT | Freq: Two times a day (BID) | CUTANEOUS | Status: DC

## 2016-03-24 NOTE — Progress Notes (Signed)
Subjective:    Patient ID: Frederick Fox, male    DOB: 15-Sep-1966, 50 y.o.   MRN: [id number]  HPI He is here to establish with a new pcp.   He is here for follow up.  OSA: he will be starting to a cpap soon.   Tickle in his throat at night, which causes a cough/clearing of throat:  He denies post nasal drip and gerd.  He often takes an over-the-counter antihistamine and is unsure if it works. He does not eat dinner late at night. He does like spicy food.  Rash:  He has a rash that is itchy on his left arm.  It is in a spot where he had tape from his sleep apnea test one week ago.  He did put on some calamine lotion on it.  He denies rash elsewhere from tape from the test.  It did occur in the crease of his elbow.   Obese: He would like to lose weight.  He is walking every morning - about 2-4 miles - higher on the weekends.  Eats primarily vegetarian and some low protein - his weight has been very stable.    Depression: He is taking his medication daily as prescribed. He denies any side effects from the medication. He feels his depression is well controlled and he is happy with his current dose of medication.   Chronic kidney disease: He has had chronic kidney disease for a while. He saw a nephrologist last fall when he is unsure if he wants to follow up. His medication was adjusted.  Hypertension: He is taking his medication daily. He is compliant with a low sodium diet.  He denies chest pain, palpitations, edema, shortness of breath and regular headaches. He is exercising regularly.  He does monitor his blood pressure at home-sometimes his blood pressure is elevated, but it depends on the time of the day..     Medications and allergies reviewed with patient and updated if appropriate.  Patient Active Problem List   Diagnosis Date Noted  . OSA (obstructive sleep apnea) 01/21/2016  . Chronic renal insufficiency, stage III (moderate) 01/31/2015  . Hyperglycemia 01/31/2015  .  High-renin essential hypertension 08/27/2013  . Depression 08/22/2013  . Elevated serum creatinine 08/22/2013  . MICROSCOPIC HEMATURIA 10/23/2010  . HEMATOSPERMIA 10/23/2010  . GOUT 01/10/2009  . Proteinuria 10/09/2008  . Essential hypertension 03/29/2008  . HIATAL HERNIA 04/19/2007  . FREQUENCY, URINARY 04/19/2007    Current Outpatient Prescriptions on File Prior to Visit  Medication Sig Dispense Refill  . albuterol (PROAIR HFA) 108 (90 BASE) MCG/ACT inhaler Inhale 1-2 puffs into the lungs every 6 (six) hours as needed.      [name] FLUoxetine (PROZAC) 20 MG tablet Take 1 tablet (20 mg total) by mouth daily. 90 tablet 0  . Fluticasone-Salmeterol (ADVAIR DISKUS) 500-50 MCG/DOSE AEPB Inhale 1 puff into the lungs every 12 (twelve) hours.      [name] losartan-hydrochlorothiazide (HYZAAR) 100-25 MG tablet Take 1 tablet by mouth daily. 90 tablet 0   No current facility-administered medications on file prior to visit.    Past Medical History  Diagnosis Date  . Hypertension   . Proteinuria 2005  . Depression     Past Surgical History  Procedure Laterality Date  . Renal biopsy  2005    protien in urine, no pathology- nephrologist in [facility]    Social History   Social History  . Marital Status: Married    Spouse Name: N/A  .  Number of Children: N/A  . Years of Education: N/A   Social History Main Topics  . Smoking status: Current Some Day Smoker    Types: Cigars  . Smokeless tobacco: Not on file  . Alcohol Use: Yes  . Drug Use: No  . Sexual Activity: Not on file   Other Topics Concern  . Not on file   Social History Narrative   Low salt diet           Family History  Problem Relation Age of Onset  . Hypertension Father   . Hypertension Mother     Review of Systems  Constitutional: Negative for fever, chills, fatigue and unexpected weight change.  HENT: Positive for congestion and rhinorrhea. Negative for postnasal drip.   Respiratory: Positive for cough. Negative  for shortness of breath and wheezing.   Cardiovascular: Negative for chest pain, palpitations and leg swelling.  Gastrointestinal: Negative for nausea and abdominal pain.       No gerd  Neurological: Positive for headaches (with elevated BP). Negative for light-headedness.       Objective:   Filed Vitals:   03/24/16 1530  BP: 162/114  Pulse: 72  Temp: 98.5 F (36.9 C)  Resp: 18   Filed Weights   03/24/16 1530  Weight: 223 lb (101.152 kg)   Body mass index is 32 kg/(m^2).   Physical Exam Constitutional: Appears well-developed and well-nourished. No distress.  Neck: Neck supple. No tracheal deviation present. No thyromegaly present.  No carotid bruit. No cervical adenopathy.   Cardiovascular: Normal rate, regular rhythm and normal heart sounds.   No murmur heard.  No edema Pulmonary/Chest: Effort normal and breath sounds normal. No respiratory distress. No wheezes.  Skin: Erythematous, slightly scaly rash crease of left elbow, no open wound or discharge, no vesicles or blisters      Assessment & Plan:   See Problem List for Assessment and Plan of chronic medical problems.  F/u 6 months

## 2016-03-24 NOTE — Telephone Encounter (Signed)
Patient returning Lindsay's call, can be reached at [phone]

## 2016-03-24 NOTE — Progress Notes (Signed)
Pre visit review using our clinic review tool, if applicable. No additional management support is needed unless otherwise documented below in the visit note. 

## 2016-03-24 NOTE — Assessment & Plan Note (Signed)
Controlled, stable Continue current dose of medication  

## 2016-03-24 NOTE — Telephone Encounter (Signed)
lmtcb x2 for pt. 

## 2016-03-24 NOTE — Assessment & Plan Note (Signed)
He is monitoring his blood pressure at home-elevated at times Not ideally controlled Work on lifestyle Will check CMP-most likely needs to see nephrology again

## 2016-03-24 NOTE — Telephone Encounter (Signed)
Spoke with the pt and notified of results/recs per RA  He verbalized understanding and willing to start CPAP  I went ahead and scheduled f/u with RA for 06/10/16  CPAP order sent to [facility]

## 2016-03-24 NOTE — Assessment & Plan Note (Signed)
Went to [facility] August 2016-? Follow-up Check CMP today Should be following with nephrology regularly Stressed better control his blood pressure

## 2016-03-24 NOTE — Assessment & Plan Note (Signed)
Related to the tape from his CPAP study Triamcinolone ointment twice daily until resolved Call if no improvement

## 2016-03-24 NOTE — Assessment & Plan Note (Addendum)
Check A1c-prediabetic Stressed low sugar/carbohydrate diet Work on weight loss continue regular exercise

## 2016-03-24 NOTE — Assessment & Plan Note (Signed)
With strenuous activity only Continue inhalers as needed

## 2016-03-24 NOTE — Patient Instructions (Signed)

## 2016-03-26 ENCOUNTER — Encounter: Payer: Self-pay | Admitting: Internal Medicine

## 2016-05-13 ENCOUNTER — Ambulatory Visit (INDEPENDENT_AMBULATORY_CARE_PROVIDER_SITE_OTHER): Payer: BC Managed Care – PPO | Admitting: Internal Medicine

## 2016-05-13 ENCOUNTER — Encounter: Payer: Self-pay | Admitting: Internal Medicine

## 2016-05-13 DIAGNOSIS — I10 Essential (primary) hypertension: Secondary | ICD-10-CM | POA: Diagnosis not present

## 2016-05-13 DIAGNOSIS — J45990 Exercise induced bronchospasm: Secondary | ICD-10-CM | POA: Diagnosis not present

## 2016-05-13 DIAGNOSIS — G8929 Other chronic pain: Secondary | ICD-10-CM

## 2016-05-13 DIAGNOSIS — D229 Melanocytic nevi, unspecified: Secondary | ICD-10-CM | POA: Diagnosis not present

## 2016-05-13 DIAGNOSIS — M542 Cervicalgia: Secondary | ICD-10-CM

## 2016-05-13 MED ORDER — ALBUTEROL SULFATE HFA 108 (90 BASE) MCG/ACT IN AERS: 1.0000 | INHALATION_SPRAY | Freq: Four times a day (QID) | RESPIRATORY_TRACT | 5 refills | Status: DC | PRN

## 2016-05-13 MED ORDER — AMLODIPINE BESYLATE 5 MG PO TABS: 5.0000 mg | ORAL_TABLET | Freq: Every day | ORAL | 3 refills | Status: DC

## 2016-05-13 NOTE — Assessment & Plan Note (Signed)
Normal appearing mole on left side of head No change in years He will continue to monitor, but at this point does not need to see derm

## 2016-05-13 NOTE — Assessment & Plan Note (Signed)
Neck feels weak/fatigued and head feels heavy at times Will start doing neck exercises No concerning symptoms so will hold off on imaging Monitor BP and make sure it is not related to elevated BP Call if no improvement

## 2016-05-13 NOTE — Patient Instructions (Signed)
  Medications reviewed and updated.  Changes include  Starting amlodipine for your blood pressure. You goal BP is less than 140/90.  Your prescription(s) have been submitted to your pharmacy. Please take as directed and contact our office if you believe you are having problem(s) with the medication(s).

## 2016-05-13 NOTE — Assessment & Plan Note (Addendum)
Not controlled here or at home Continue losartan-hctz at current dose Start amlodipine 5 mg daily Continue lifestyle changes - decrease calorie intake to lose weight

## 2016-05-13 NOTE — Assessment & Plan Note (Signed)
Inhaler renewed Continue prn use

## 2016-05-13 NOTE — Progress Notes (Signed)
Subjective:    Patient ID: Frederick Fox, male    DOB: 1965-11-08, 50 y.o.   MRN: [id number]  HPI He is here for an acute visit.  Mole:  He has a  Mole on his head that has not changed.  He has noticed that a hair has started to grow out of it.  It is a gray hair and he heard that if a gray hair grows out of the mole it should be evaluated.   The mole has been there forever and has not changed.  He is compliant with sunscreen.    Neck:  He has a neck issue and has seen a neurosurgeron in te past.  He was told he does not need surgery now, but may need it in the future.  Somedays his head feels like it is heavy and is hard to hold up.  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   He does not feel his neck / head symptoms are related to his blood pressure.  He denies any numbness/tingling or weakness in his arms or legs.     Hypertension: He is taking his medication daily. He is compliant with a low sodium diet.  He denies chest pain, palpitations, edema, shortness of breath and regular headaches. He is exercising regularly - he has started walking 6 miles.  He does not feel any weight loss.  He never feels full.  He has eaten 3-4 meals today already.  He does monitor his blood pressure at home, and it is similar to today.    With the walking he feels out of breath.  He was diangosed with exercise induced asthma in the past and uses an inhaler, which usually helps.  His inhaler is expired and he needs a new one.  He denies a cough, wheeze, fever and chest pain.    Medications and allergies reviewed with patient and updated if appropriate.  Patient Active Problem List   Diagnosis Date Noted  . Exercise-induced asthma 03/24/2016  . Hiatal hernia 03/24/2016  . Contact dermatitis 03/24/2016  . OSA (obstructive sleep apnea) 01/21/2016  . Chronic renal insufficiency, stage III (moderate) 01/31/2015  . Hyperglycemia 01/31/2015  . High-renin essential  hypertension 08/27/2013  . Depression 08/22/2013  . MICROSCOPIC HEMATURIA 10/23/2010  . HEMATOSPERMIA 10/23/2010  . GOUT 01/10/2009  . Proteinuria 10/09/2008  . Essential hypertension 03/29/2008  . FREQUENCY, URINARY 04/19/2007    Current Outpatient Prescriptions on File Prior to Visit  Medication Sig Dispense Refill  . albuterol (PROAIR HFA) 108 (90 BASE) MCG/ACT inhaler Inhale 1-2 puffs into the lungs every 6 (six) hours as needed.      [name] FLUoxetine (PROZAC) 20 MG tablet Take 1 tablet (20 mg total) by mouth daily. 90 tablet 3  . fluticasone (FLONASE) 50 MCG/ACT nasal spray Place 2 sprays into both nostrils daily. 16 g 6  . Fluticasone-Salmeterol (ADVAIR DISKUS) 500-50 MCG/DOSE AEPB Inhale 1 puff into the lungs every 12 (twelve) hours.      [name] losartan-hydrochlorothiazide (HYZAAR) 100-25 MG tablet Take 1 tablet by mouth daily. 90 tablet 0  . ranitidine (ZANTAC) 150 MG capsule Take 1 capsule (150 mg total) by mouth every evening. 30 capsule 5  . triamcinolone ointment (KENALOG) 0.5 % Apply 1 application topically 2 (two) times daily. 30 g 0   No current facility-administered medications on file prior to visit.     Past Medical History:  Diagnosis  Date  . Depression   . Hypertension   . Proteinuria 2005    Past Surgical History:  Procedure Laterality Date  . RENAL BIOPSY  2005   protien in urine, no pathology- nephrologist in [place] History  . Marital status: Married    Spouse name: N/A  . Number of children: N/A  . Years of education: N/A   Social History Main Topics  . Smoking status: Current Some Day Smoker    Types: Cigars  . Smokeless tobacco: None  . Alcohol use Yes  . Drug use: No  . Sexual activity: Not Asked   Other Topics Concern  . None   Social History Narrative   Low salt diet           Family History  Problem Relation Age of Onset  . Hypertension Father   . Hypertension Mother     Review of Systems    Constitutional: Negative for fever.  Respiratory: Positive for chest tightness (with exercise). Negative for cough, shortness of breath and wheezing.   Cardiovascular: Negative for chest pain, palpitations and leg swelling.  Neurological: Negative for dizziness, weakness, light-headedness, numbness and headaches.       Objective:   Vitals:   05/13/16 1612  BP: (!) 152/102  Pulse: 72  Resp: 16  Temp: 98.6 F (37 C)   Filed Weights   05/13/16 1612  Weight: 224 lb (101.6 kg)   Body mass index is 32.14 kg/m.   Physical Exam Constitutional: Appears well-developed and well-nourished. No distress.  HENT:  Head: Normocephalic and atraumatic.  Neck: Neck supple. No tracheal deviation present. No thyromegaly present.  Cardiovascular: Normal rate, regular rhythm and normal heart sounds.   No murmur heard. No carotid bruit  Pulmonary/Chest: Effort normal and breath sounds normal. No respiratory distress. No has no wheezes. No rales.  Musculoskeletal: No edema.  Lymphadenopathy: No cervical adenopathy.  Skin: Skin is warm and dry. Not diaphoretic. normal appear raised mole left side of head Psychiatric: Normal mood and affect. Behavior is normal.         Assessment & Plan:   See Problem List for Assessment and Plan of chronic medical problems.

## 2016-05-20 ENCOUNTER — Emergency Department (HOSPITAL_COMMUNITY)
Admission: EM | Admit: 2016-05-20 | Discharge: 2016-05-20 | Disposition: A | Discharge status: Home / Self Care | Payer: BC Managed Care – PPO | Attending: Emergency Medicine | Admitting: Emergency Medicine

## 2016-05-20 ENCOUNTER — Encounter (HOSPITAL_COMMUNITY): Payer: Self-pay | Admitting: *Deleted

## 2016-05-20 DIAGNOSIS — N183 Chronic kidney disease, stage 3 (moderate): Secondary | ICD-10-CM | POA: Insufficient documentation

## 2016-05-20 DIAGNOSIS — Z79899 Other long term (current) drug therapy: Secondary | ICD-10-CM | POA: Diagnosis not present

## 2016-05-20 DIAGNOSIS — I129 Hypertensive chronic kidney disease with stage 1 through stage 4 chronic kidney disease, or unspecified chronic kidney disease: Secondary | ICD-10-CM | POA: Insufficient documentation

## 2016-05-20 DIAGNOSIS — M545 Low back pain: Secondary | ICD-10-CM | POA: Diagnosis present

## 2016-05-20 DIAGNOSIS — M5441 Lumbago with sciatica, right side: Secondary | ICD-10-CM

## 2016-05-20 DIAGNOSIS — F17210 Nicotine dependence, cigarettes, uncomplicated: Secondary | ICD-10-CM | POA: Diagnosis not present

## 2016-05-20 MED ORDER — LIDOCAINE 5 % EX PTCH: 1.0000 | MEDICATED_PATCH | CUTANEOUS | 0 refills | Status: DC

## 2016-05-20 MED ORDER — LIDOCAINE 5 % EX PTCH
1.0000 | MEDICATED_PATCH | CUTANEOUS | Status: DC
  Administered 2016-05-20: 1 via TRANSDERMAL
  Filled 2016-05-20: qty 1

## 2016-05-20 MED ORDER — CYCLOBENZAPRINE HCL 10 MG PO TABS
5.0000 mg | ORAL_TABLET | Freq: Once | ORAL | Status: AC
  Administered 2016-05-20: 5 mg via ORAL
  Filled 2016-05-20: qty 1

## 2016-05-20 MED ORDER — DEXAMETHASONE SODIUM PHOSPHATE 10 MG/ML IJ SOLN
10.0000 mg | Freq: Once | INTRAMUSCULAR | Status: AC
  Administered 2016-05-20: 10 mg via INTRAMUSCULAR
  Filled 2016-05-20: qty 1

## 2016-05-20 MED ORDER — CYCLOBENZAPRINE HCL 10 MG PO TABS: ORAL_TABLET | ORAL | 0 refills | Status: DC

## 2016-05-20 NOTE — ED Notes (Signed)
Pt c/o right sided lower back pain, starting a few days ago, that got progressively worse early this morning (pt sts pain woke him up), he sts pain is shooting all the way from his lower back to below his right knee. Denies urinary symptoms, pain is 8/10. Pt is ambulatory and reports pain worsens when trying to sit or lay down.

## 2016-05-20 NOTE — ED Notes (Signed)
He is ambulatory in rm., which is position of comfort.

## 2016-05-20 NOTE — ED Provider Notes (Signed)
[facility] DEPT Provider Note   CSN: [id number] Arrival date & time: 05/20/16  0604  First Provider Contact:  First MD Initiated Contact with Patient 05/20/16 [phone]        History   Chief Complaint Chief Complaint  Patient presents with  . Back Pain    HPI Frederick Fox is a 50 y.o. male.  HPI Frederick Fox is a 50 y.o. male with PMH significant for HTN, chronic renal insufficiency who presents with couple day history of gradual onset, constant, worsening, radiating lower back pain.  He reports pain radiates from right lower back down outside of leg to the ankle.  He says it is sharp at time, but "just hurts really bad".  Worse with sitting or trying to lie down.  Has tried tylenol with little relief, unable to take NSAIDs due to PMH.  Pain 8/10.  Ambulatory.  No fever, chills, weight loss, b/b incontinence, abdominal pain, numbness, weakness, IVDU, malignancy. No injury/trauma.   PCP: Smitty Pluck   Past Medical History:  Diagnosis Date  . Depression   . Hypertension   . Proteinuria 2005    Patient Active Problem List   Diagnosis Date Noted  . Neck pain, chronic 05/13/2016  . Nevus 05/13/2016  . Exercise-induced asthma 03/24/2016  . Hiatal hernia 03/24/2016  . Contact dermatitis 03/24/2016  . OSA (obstructive sleep apnea) 01/21/2016  . Chronic renal insufficiency, stage III (moderate) 01/31/2015  . Hyperglycemia 01/31/2015  . High-renin essential hypertension 08/27/2013  . Depression 08/22/2013  . MICROSCOPIC HEMATURIA 10/23/2010  . HEMATOSPERMIA 10/23/2010  . GOUT 01/10/2009  . Proteinuria 10/09/2008  . Essential hypertension 03/29/2008  . FREQUENCY, URINARY 04/19/2007    Past Surgical History:  Procedure Laterality Date  . RENAL BIOPSY  2005   protien in urine, no pathology- nephrologist in [facility] Medications    Prior to Admission medications   Medication Sig Start Date End Date Taking? Authorizing Provider  albuterol (PROAIR  HFA) 108 (90 Base) MCG/ACT inhaler Inhale 1-2 puffs into the lungs every 6 (six) hours as needed. 05/13/16   Binnie Rail, MD  amLODipine (NORVASC) 5 MG tablet Take 1 tablet (5 mg total) by mouth daily. 05/13/16   Binnie Rail, MD  cyclobenzaprine (FLEXERIL) 10 MG tablet Take 0.5-1 tablet (5-10 mg) by mouth BID PRN for back pain 05/20/16   Gloriann Loan, PA-C  FLUoxetine (PROZAC) 20 MG tablet Take 1 tablet (20 mg total) by mouth daily. 03/24/16   Binnie Rail, MD  fluticasone (FLONASE) 50 MCG/ACT nasal spray Place 2 sprays into both nostrils daily. 03/24/16   Binnie Rail, MD  Fluticasone-Salmeterol (ADVAIR DISKUS) 500-50 MCG/DOSE AEPB Inhale 1 puff into the lungs every 12 (twelve) hours.      Historical Provider, MD  lidocaine (LIDODERM) 5 % Place 1 patch onto the skin daily. Remove & Discard patch within 12 hours or as directed by MD 05/20/16   Gloriann Loan, PA-C  losartan-hydrochlorothiazide (HYZAAR) 100-25 MG tablet Take 1 tablet by mouth daily. 01/21/16   Binnie Rail, MD  ranitidine (ZANTAC) 150 MG capsule Take 1 capsule (150 mg total) by mouth every evening. 03/24/16   Binnie Rail, MD  triamcinolone ointment (KENALOG) 0.5 % Apply 1 application topically 2 (two) times daily. 03/24/16   Binnie Rail, MD    Family History Family History  Problem Relation Age of Onset  . Hypertension Father   . Hypertension Mother  Social History Social History  Substance Use Topics  . Smoking status: Current Some Day Smoker    Types: Cigars  . Smokeless tobacco: Never Used  . Alcohol use Yes     Allergies   Lisinopril   Review of Systems Review of Systems All other systems negative unless otherwise stated in HPI   Physical Exam Updated Vital Signs BP 148/88 (BP Location: Left Arm)   Pulse 66   Temp 97.8 F (36.6 C) (Oral)   Resp 18   Ht 5\' 10"  (1.778 m)   Wt 97.5 kg   SpO2 98%   BMI 30.85 kg/m   Physical Exam  Constitutional: He is oriented to person, place, and time. He appears  well-developed and well-nourished.  HENT:  Head: Atraumatic.  Eyes: Conjunctivae are normal.  Cardiovascular: Normal rate, regular rhythm, normal heart sounds and intact distal pulses.   Pulses:      Dorsalis pedis pulses are 2+ on the right side, and 2+ on the left side.  Pulmonary/Chest: Effort normal and breath sounds normal.  Abdominal: Soft. Bowel sounds are normal. He exhibits no distension. There is no tenderness.  Musculoskeletal:       Lumbar back: He exhibits decreased range of motion (due to pain), tenderness and pain. He exhibits no bony tenderness.       Back:  No spinous process tenderness.  No step offs. No crepitus.  Neurological: He is alert and oriented to person, place, and time.  No saddle anesthesia. Strength and sensation intact bilaterally throughout lower extremities. Normal gait.   Skin: Skin is warm and dry.  Psychiatric: He has a normal mood and affect. His behavior is normal.     ED Treatments / Results  Labs (all labs ordered are listed, but only abnormal results are displayed) Labs Reviewed - No data to display  EKG  EKG Interpretation None       Radiology No results found.  Procedures Procedures (including critical care time)  Medications Ordered in ED Medications  lidocaine (LIDODERM) 5 % 1 patch (1 patch Transdermal Patch Applied 05/20/16 0724)  dexamethasone (DECADRON) injection 10 mg (10 mg Intramuscular Given 05/20/16 0723)  cyclobenzaprine (FLEXERIL) tablet 5 mg (5 mg Oral Given 05/20/16 [id number])     Initial Impression / Assessment and Plan / ED Course  I have reviewed the triage vital signs and the nursing notes.  Pertinent labs & imaging results that were available during my care of the patient were reviewed by me and considered in my medical decision making (see chart for details).  Clinical Course   MDM  Patient presents with low back pain.  VSS.  No injury/trauma.  No red flags.  No neurological deficits.  Distal pulses intact.   No gait abnormalities.  Suspect L5 radiculopathy.  Doubt cauda equina.  Doubt infectious process.  Doubt AAA.  Discussed return precautions to the ED.  Home with lidoderm and flexeril.  Follow up with PCP 1 week for possible further evaluation with MRI.    Final Clinical Impressions(s) / ED Diagnoses   Final diagnoses:  Right-sided low back pain with right-sided sciatica    New Prescriptions New Prescriptions   CYCLOBENZAPRINE (FLEXERIL) 10 MG TABLET    Take 0.5-1 tablet (5-10 mg) by mouth BID PRN for back pain   LIDOCAINE (LIDODERM) 5 %    Place 1 patch onto the skin daily. Remove & Discard patch within 12 hours or as directed by MD      Gloriann Loan, PA-C  05/20/16 0743    April Palumbo, MD 05/28/16 2304

## 2016-05-26 ENCOUNTER — Encounter: Payer: Self-pay | Admitting: Family

## 2016-05-26 ENCOUNTER — Ambulatory Visit (INDEPENDENT_AMBULATORY_CARE_PROVIDER_SITE_OTHER): Payer: BC Managed Care – PPO | Admitting: Family

## 2016-05-26 DIAGNOSIS — M5431 Sciatica, right side: Secondary | ICD-10-CM | POA: Diagnosis not present

## 2016-05-26 MED ORDER — PREDNISONE 20 MG PO TABS: ORAL_TABLET | ORAL | 0 refills | Status: DC

## 2016-05-26 NOTE — Progress Notes (Signed)
Subjective:    Patient ID: Frederick Fox, male    DOB: 1966/03/18, 50 y.o.   MRN: [id number]  Chief Complaint  Patient presents with  . Hospitalization Follow-up    not having so much pain in his lower back but his right leg is in a lot of pain    HPI:  Frederick Fox is a 51 y.o. male who  has a past medical history of Depression; Hypertension; and Proteinuria (2005). and presents today for a follow up office visit.   Recently evaluated in the emergency department and this with gradual onset, constant, worsening radiating lower back pain. Severity was described as "just hurting really bad". Aggravating factors include sitting in trying to lay down. Treatment was attempted with Tylenol with minimal relief. Physical exam with decreased range of motion secondary to pain. He was diagnosed with suspected L5 radiculopathy. He was prescribed cyclobenzaprine and lidocaine patches. All ED records, notes, and labs were reviewed in detail.   Since leaving the emergency department he continues to experience the associated symptom of pain located in his lower back and right leg. Pain is described as sharp and burning. Severity is enough to effect his gait. States the pain comes and go in waves. Pain generally stops in the calf level and feels like it is asleep. Reports taking the medication prescribed in the ED as prescribed with no adverse side effects. There are some overall improvements since initial onset. No trauma or injury to his back. He did have a trip on the car where he was seated in the car for 4-5 hours. This is new.   Allergies  Allergen Reactions  . Lisinopril     cough     Current Outpatient Prescriptions on File Prior to Visit  Medication Sig Dispense Refill  . acetaminophen (TYLENOL) 500 MG tablet Take 1,000 mg by mouth every 6 (six) hours as needed for mild pain.    [name] albuterol (PROAIR HFA) 108 (90 Base) MCG/ACT inhaler Inhale 1-2 puffs into the lungs every 6 (six) hours  as needed. (Patient taking differently: Inhale 2 puffs into the lungs every 6 (six) hours as needed for wheezing or shortness of breath. ) 1 Inhaler 5  . amLODipine (NORVASC) 5 MG tablet Take 1 tablet (5 mg total) by mouth daily. (Patient taking differently: Take 5 mg by mouth at bedtime. ) 90 tablet 3  . cyclobenzaprine (FLEXERIL) 10 MG tablet Take 0.5-1 tablet (5-10 mg) by mouth BID PRN for back pain 20 tablet 0  . FLUoxetine (PROZAC) 20 MG tablet Take 1 tablet (20 mg total) by mouth daily. (Patient taking differently: Take 20 mg by mouth at bedtime. ) 90 tablet 3  . fluticasone (FLONASE) 50 MCG/ACT nasal spray Place 2 sprays into both nostrils daily. (Patient taking differently: Place 2 sprays into both nostrils at bedtime. ) 16 g 6  . lidocaine (LIDODERM) 5 % Place 1 patch onto the skin daily. Remove & Discard patch within 12 hours or as directed by MD 7 patch 0  . losartan-hydrochlorothiazide (HYZAAR) 100-25 MG tablet Take 1 tablet by mouth daily. (Patient taking differently: Take 1 tablet by mouth at bedtime. ) 90 tablet 0  . ranitidine (ZANTAC) 150 MG capsule Take 1 capsule (150 mg total) by mouth every evening. 30 capsule 5  . triamcinolone ointment (KENALOG) 0.5 % Apply 1 application topically 2 (two) times daily. 30 g 0   No current facility-administered medications on file prior to visit.  Past Surgical History:  Procedure Laterality Date  . RENAL BIOPSY  2005   protien in urine, no pathology- nephrologist in [facility]    Past Medical History:  Diagnosis Date  . Depression   . Hypertension   . Proteinuria 2005    Review of Systems  Constitutional: Negative for chills and fever.  Respiratory: Negative for chest tightness and shortness of breath.   Cardiovascular: Negative for chest pain, palpitations and leg swelling.  Musculoskeletal: Positive for back pain.  Neurological: Positive for weakness. Negative for numbness.      Objective:    BP 130/84 (BP Location:  Left Arm, Patient Position: Sitting, Cuff Size: Large)   Pulse 93   Temp 98.2 F (36.8 C) (Oral)   Resp 16   Ht 5\' 10"  (1.778 m)   Wt 215 lb (97.5 kg)   SpO2 97%   BMI 30.85 kg/m  Nursing note and vital signs reviewed.  Physical Exam  Constitutional: He is oriented to person, place, and time. He appears well-developed and well-nourished. No distress.  Cardiovascular: Normal rate, regular rhythm, normal heart sounds and intact distal pulses.   Pulmonary/Chest: Effort normal and breath sounds normal.  Musculoskeletal:  Low back - No obvious deformity, discoloration or edema. There is tenderness of the right paraspinal musculature with discomfort noted in the sciatic nerve distribution. Range of motion is slightly limited secondary to pain. Straight leg raise is positive. Faber's test is negative. Pulses and sensation are intact and appropriate.  Neurological: He is alert and oriented to person, place, and time.  Skin: Skin is warm and dry.  Psychiatric: He has a normal mood and affect. His behavior is normal. Judgment and thought content normal.       Assessment & Plan:   Problem List Items Addressed This Visit      Nervous and Auditory   Sciatica of right side    Symptoms and exam consistent with sciatica with concern for possible disc pathology given radiculopathy. Start prednisone taper. Encouraged nonpharmacological treatments including ice, heat, and home exercise therapy. Continue previously prescribed cyclobenzaprine as needed for muscle spasm. Follow-up in 3 weeks or sooner if symptoms worsen or do not improve.      Relevant Medications   predniSONE (DELTASONE) 20 MG tablet    Other Visit Diagnoses   None.      I have changed Mr. Julich predniSONE. I am also having him maintain his losartan-hydrochlorothiazide, fluticasone, ranitidine, triamcinolone ointment, FLUoxetine, albuterol, amLODipine, cyclobenzaprine, lidocaine, and acetaminophen.   Meds ordered this  encounter  Medications  . DISCONTD: predniSONE (DELTASONE) 20 MG tablet    Sig: Take 3 tablets by mouth daily for 4 days then 2 tablets by mouth daily for 4 days then 2 tablets by mouth daily for 4 days    Dispense:  24 tablet    Refill:  0    Order Specific Question:   Supervising Provider    Answer:   Pricilla Holm A [id number]  . predniSONE (DELTASONE) 20 MG tablet    Sig: Take 3 tablets by mouth daily for 4 days then 2 tablets by mouth daily for 4 days then 1 tablet by mouth daily for 4 days    Dispense:  24 tablet    Refill:  0    Please discontinue previous prednisone order.    Order Specific Question:   Supervising Provider    Answer:   Pricilla Holm A [id number]     Follow-up: Return in about 3 weeks (around  06/16/2016), or if symptoms worsen or fail to improve.  Mauricio Po, FNP

## 2016-05-26 NOTE — Patient Instructions (Signed)
Thank you for choosing [facility].  Summary/Instructions:  Start taking the prednisone.   Ice / Heat for 20 minutes every 2 hours as needed.  Exercises daily.   Your prescription(s) have been submitted to your pharmacy or been printed and provided for you. Please take as directed and contact our office if you believe you are having problem(s) with the medication(s) or have any questions.  If your symptoms worsen or fail to improve, please contact our office for further instruction, or in case of emergency go directly to the emergency room at the closest medical facility.    Sciatica With Rehab The sciatic nerve runs from the back down the leg and is responsible for sensation and control of the muscles in the back (posterior) side of the thigh, lower leg, and foot. Sciatica is a condition that is characterized by inflammation of this nerve.  SYMPTOMS   Signs of nerve damage, including numbness and/or weakness along the posterior side of the lower extremity.  Pain in the back of the thigh that may also travel down the leg.  Pain that worsens when sitting for long periods of time.  Occasionally, pain in the back or buttock. CAUSES  Inflammation of the sciatic nerve is the cause of sciatica. The inflammation is due to something irritating the nerve. Common sources of irritation include:  Sitting for long periods of time.  Direct trauma to the nerve.  Arthritis of the spine.  Herniated or ruptured disk.  Slipping of the vertebrae (spondylolisthesis).  Pressure from soft tissues, such as muscles or ligament-like tissue (fascia). RISK INCREASES WITH:  Sports that place pressure or stress on the spine (football or weightlifting).  Poor strength and flexibility.  Failure to warm up properly before activity.  Family history of low back pain or disk disorders.  Previous back injury or surgery.  Poor body mechanics, especially when lifting, or poor posture. PREVENTION    Warm up and stretch properly before activity.  Maintain physical fitness:  Strength, flexibility, and endurance.  Cardiovascular fitness.  Learn and use proper technique, especially with posture and lifting. When possible, have coach correct improper technique.  Avoid activities that place stress on the spine. PROGNOSIS If treated properly, then sciatica usually resolves within 6 weeks. However, occasionally surgery is necessary.  RELATED COMPLICATIONS   Permanent nerve damage, including pain, numbness, tingle, or weakness.  Chronic back pain.  Risks of surgery: infection, bleeding, nerve damage, or damage to surrounding tissues. TREATMENT Treatment initially involves resting from any activities that aggravate your symptoms. The use of ice and medication may help reduce pain and inflammation. The use of strengthening and stretching exercises may help reduce pain with activity. These exercises may be performed at home or with referral to a therapist. A therapist may recommend further treatments, such as transcutaneous electronic nerve stimulation (TENS) or ultrasound. Your caregiver may recommend corticosteroid injections to help reduce inflammation of the sciatic nerve. If symptoms persist despite non-surgical (conservative) treatment, then surgery may be recommended. MEDICATION  If pain medication is necessary, then nonsteroidal anti-inflammatory medications, such as aspirin and ibuprofen, or other minor pain relievers, such as acetaminophen, are often recommended.  Do not take pain medication for 7 days before surgery.  Prescription pain relievers may be given if deemed necessary by your caregiver. Use only as directed and only as much as you need.  Ointments applied to the skin may be helpful.  Corticosteroid injections may be given by your caregiver. These injections should be reserved for  the most serious cases, because they may only be given a certain number of times. HEAT  AND COLD  Cold treatment (icing) relieves pain and reduces inflammation. Cold treatment should be applied for 10 to 15 minutes every 2 to 3 hours for inflammation and pain and immediately after any activity that aggravates your symptoms. Use ice packs or massage the area with a piece of ice (ice massage).  Heat treatment may be used prior to performing the stretching and strengthening activities prescribed by your caregiver, physical therapist, or athletic trainer. Use a heat pack or soak the injury in warm water. SEEK MEDICAL CARE IF:  Treatment seems to offer no benefit, or the condition worsens.  Any medications produce adverse side effects. EXERCISES  RANGE OF MOTION (ROM) AND STRETCHING EXERCISES - Sciatica Most people with sciatic will find that their symptoms worsen with either excessive bending forward (flexion) or arching at the low back (extension). The exercises which will help resolve your symptoms will focus on the opposite motion. Your physician, physical therapist or athletic trainer will help you determine which exercises will be most helpful to resolve your low back pain. Do not complete any exercises without first consulting with your clinician. Discontinue any exercises which worsen your symptoms until you speak to your clinician. If you have pain, numbness or tingling which travels down into your buttocks, leg or foot, the goal of the therapy is for these symptoms to move closer to your back and eventually resolve. Occasionally, these leg symptoms will get better, but your low back pain may worsen; this is typically an indication of progress in your rehabilitation. Be certain to be very alert to any changes in your symptoms and the activities in which you participated in the 24 hours prior to the change. Sharing this information with your clinician will allow him/her to most efficiently treat your condition. These exercises may help you when beginning to rehabilitate your injury.  Your symptoms may resolve with or without further involvement from your physician, physical therapist or athletic trainer. While completing these exercises, remember:   Restoring tissue flexibility helps normal motion to return to the joints. This allows healthier, less painful movement and activity.  An effective stretch should be held for at least 30 seconds.  A stretch should never be painful. You should only feel a gentle lengthening or release in the stretched tissue. FLEXION RANGE OF MOTION AND STRETCHING EXERCISES: STRETCH - Flexion, Single Knee to Chest   Lie on a firm bed or floor with both legs extended in front of you.  Keeping one leg in contact with the floor, bring your opposite knee to your chest. Hold your leg in place by either grabbing behind your thigh or at your knee.  Pull until you feel a gentle stretch in your low back. Hold __________ seconds.  Slowly release your grasp and repeat the exercise with the opposite side. Repeat __________ times. Complete this exercise __________ times per day.  STRETCH - Flexion, Double Knee to Chest  Lie on a firm bed or floor with both legs extended in front of you.  Keeping one leg in contact with the floor, bring your opposite knee to your chest.  Tense your stomach muscles to support your back and then lift your other knee to your chest. Hold your legs in place by either grabbing behind your thighs or at your knees.  Pull both knees toward your chest until you feel a gentle stretch in your low back. Hold __________  seconds.  Tense your stomach muscles and slowly return one leg at a time to the floor. Repeat __________ times. Complete this exercise __________ times per day.  STRETCH - Low Trunk Rotation   Lie on a firm bed or floor. Keeping your legs in front of you, bend your knees so they are both pointed toward the ceiling and your feet are flat on the floor.  Extend your arms out to the side. This will stabilize your  upper body by keeping your shoulders in contact with the floor.  Gently and slowly drop both knees together to one side until you feel a gentle stretch in your low back. Hold for __________ seconds.  Tense your stomach muscles to support your low back as you bring your knees back to the starting position. Repeat the exercise to the other side. Repeat __________ times. Complete this exercise __________ times per day  EXTENSION RANGE OF MOTION AND FLEXIBILITY EXERCISES: STRETCH - Extension, Prone on Elbows  Lie on your stomach on the floor, a bed will be too soft. Place your palms about shoulder width apart and at the height of your head.  Place your elbows under your shoulders. If this is too painful, stack pillows under your chest.  Allow your body to relax so that your hips drop lower and make contact more completely with the floor.  Hold this position for __________ seconds.  Slowly return to lying flat on the floor. Repeat __________ times. Complete this exercise __________ times per day.  RANGE OF MOTION - Extension, Prone Press Ups  Lie on your stomach on the floor, a bed will be too soft. Place your palms about shoulder width apart and at the height of your head.  Keeping your back as relaxed as possible, slowly straighten your elbows while keeping your hips on the floor. You may adjust the placement of your hands to maximize your comfort. As you gain motion, your hands will come more underneath your shoulders.  Hold this position __________ seconds.  Slowly return to lying flat on the floor. Repeat __________ times. Complete this exercise __________ times per day.  STRENGTHENING EXERCISES - Sciatica  These exercises may help you when beginning to rehabilitate your injury. These exercises should be done near your "sweet spot." This is the neutral, low-back arch, somewhere between fully rounded and fully arched, that is your least painful position. When performed in this safe range  of motion, these exercises can be used for people who have either a flexion or extension based injury. These exercises may resolve your symptoms with or without further involvement from your physician, physical therapist or athletic trainer. While completing these exercises, remember:   Muscles can gain both the endurance and the strength needed for everyday activities through controlled exercises.  Complete these exercises as instructed by your physician, physical therapist or athletic trainer. Progress with the resistance and repetition exercises only as your caregiver advises.  You may experience muscle soreness or fatigue, but the pain or discomfort you are trying to eliminate should never worsen during these exercises. If this pain does worsen, stop and make certain you are following the directions exactly. If the pain is still present after adjustments, discontinue the exercise until you can discuss the trouble with your clinician. STRENGTHENING - Deep Abdominals, Pelvic Tilt   Lie on a firm bed or floor. Keeping your legs in front of you, bend your knees so they are both pointed toward the ceiling and your feet are flat on  the floor.  Tense your lower abdominal muscles to press your low back into the floor. This motion will rotate your pelvis so that your tail bone is scooping upwards rather than pointing at your feet or into the floor.  With a gentle tension and even breathing, hold this position for __________ seconds. Repeat __________ times. Complete this exercise __________ times per day.  STRENGTHENING - Abdominals, Crunches   Lie on a firm bed or floor. Keeping your legs in front of you, bend your knees so they are both pointed toward the ceiling and your feet are flat on the floor. Cross your arms over your chest.  Slightly tip your chin down without bending your neck.  Tense your abdominals and slowly lift your trunk high enough to just clear your shoulder blades. Lifting higher  can put excessive stress on the low back and does not further strengthen your abdominal muscles.  Control your return to the starting position. Repeat __________ times. Complete this exercise __________ times per day.  STRENGTHENING - Quadruped, Opposite UE/LE Lift  Assume a hands and knees position on a firm surface. Keep your hands under your shoulders and your knees under your hips. You may place padding under your knees for comfort.  Find your neutral spine and gently tense your abdominal muscles so that you can maintain this position. Your shoulders and hips should form a rectangle that is parallel with the floor and is not twisted.  Keeping your trunk steady, lift your right hand no higher than your shoulder and then your left leg no higher than your hip. Make sure you are not holding your breath. Hold this position __________ seconds.  Continuing to keep your abdominal muscles tense and your back steady, slowly return to your starting position. Repeat with the opposite arm and leg. Repeat __________ times. Complete this exercise __________ times per day.  STRENGTHENING - Abdominals and Quadriceps, Straight Leg Raise   Lie on a firm bed or floor with both legs extended in front of you.  Keeping one leg in contact with the floor, bend the other knee so that your foot can rest flat on the floor.  Find your neutral spine, and tense your abdominal muscles to maintain your spinal position throughout the exercise.  Slowly lift your straight leg off the floor about 6 inches for a count of 15, making sure to not hold your breath.  Still keeping your neutral spine, slowly lower your leg all the way to the floor. Repeat this exercise with each leg __________ times. Complete this exercise __________ times per day. POSTURE AND BODY MECHANICS CONSIDERATIONS - Sciatica Keeping correct posture when sitting, standing or completing your activities will reduce the stress put on different body tissues,  allowing injured tissues a chance to heal and limiting painful experiences. The following are general guidelines for improved posture. Your physician or physical therapist will provide you with any instructions specific to your needs. While reading these guidelines, remember:  The exercises prescribed by your provider will help you have the flexibility and strength to maintain correct postures.  The correct posture provides the optimal environment for your joints to work. All of your joints have less wear and tear when properly supported by a spine with good posture. This means you will experience a healthier, less painful body.  Correct posture must be practiced with all of your activities, especially prolonged sitting and standing. Correct posture is as important when doing repetitive low-stress activities (typing) as it is when  doing a single heavy-load activity (lifting). RESTING POSITIONS Consider which positions are most painful for you when choosing a resting position. If you have pain with flexion-based activities (sitting, bending, stooping, squatting), choose a position that allows you to rest in a less flexed posture. You would want to avoid curling into a fetal position on your side. If your pain worsens with extension-based activities (prolonged standing, working overhead), avoid resting in an extended position such as sleeping on your stomach. Most people will find more comfort when they rest with their spine in a more neutral position, neither too rounded nor too arched. Lying on a non-sagging bed on your side with a pillow between your knees, or on your back with a pillow under your knees will often provide some relief. Keep in mind, being in any one position for a prolonged period of time, no matter how correct your posture, can still lead to stiffness. PROPER SITTING POSTURE In order to minimize stress and discomfort on your spine, you must sit with correct posture Sitting with good  posture should be effortless for a healthy body. Returning to good posture is a gradual process. Many people can work toward this most comfortably by using various supports until they have the flexibility and strength to maintain this posture on their own. When sitting with proper posture, your ears will fall over your shoulders and your shoulders will fall over your hips. You should use the back of the chair to support your upper back. Your low back will be in a neutral position, just slightly arched. You may place a small pillow or folded towel at the base of your low back for support.  When working at a desk, create an environment that supports good, upright posture. Without extra support, muscles fatigue and lead to excessive strain on joints and other tissues. Keep these recommendations in mind: CHAIR:   A chair should be able to slide under your desk when your back makes contact with the back of the chair. This allows you to work closely.  The chair's height should allow your eyes to be level with the upper part of your monitor and your hands to be slightly lower than your elbows. BODY POSITION  Your feet should make contact with the floor. If this is not possible, use a foot rest.  Keep your ears over your shoulders. This will reduce stress on your neck and low back. INCORRECT SITTING POSTURES   If you are feeling tired and unable to assume a healthy sitting posture, do not slouch or slump. This puts excessive strain on your back tissues, causing more damage and pain. Healthier options include:  Using more support, like a lumbar pillow.  Switching tasks to something that requires you to be upright or walking.  Talking a brief walk.  Lying down to rest in a neutral-spine position. PROLONGED STANDING WHILE SLIGHTLY LEANING FORWARD  When completing a task that requires you to lean forward while standing in one place for a long time, place either foot up on a stationary 2-4 inch high  object to help maintain the best posture. When both feet are on the ground, the low back tends to lose its slight inward curve. If this curve flattens (or becomes too large), then the back and your other joints will experience too much stress, fatigue more quickly and can cause pain.  CORRECT STANDING POSTURES Proper standing posture should be assumed with all daily activities, even if they only take a few moments, like  when brushing your teeth. As in sitting, your ears should fall over your shoulders and your shoulders should fall over your hips. You should keep a slight tension in your abdominal muscles to brace your spine. Your tailbone should point down to the ground, not behind your body, resulting in an over-extended swayback posture.  INCORRECT STANDING POSTURES  Common incorrect standing postures include a forward head, locked knees and/or an excessive swayback. WALKING Walk with an upright posture. Your ears, shoulders and hips should all line-up. PROLONGED ACTIVITY IN A FLEXED POSITION When completing a task that requires you to bend forward at your waist or lean over a low surface, try to find a way to stabilize 3 of 4 of your limbs. You can place a hand or elbow on your thigh or rest a knee on the surface you are reaching across. This will provide you more stability so that your muscles do not fatigue as quickly. By keeping your knees relaxed, or slightly bent, you will also reduce stress across your low back. CORRECT LIFTING TECHNIQUES DO :   Assume a wide stance. This will provide you more stability and the opportunity to get as close as possible to the object which you are lifting.  Tense your abdominals to brace your spine; then bend at the knees and hips. Keeping your back locked in a neutral-spine position, lift using your leg muscles. Lift with your legs, keeping your back straight.  Test the weight of unknown objects before attempting to lift them.  Try to keep your elbows  locked down at your sides in order get the best strength from your shoulders when carrying an object.  Always ask for help when lifting heavy or awkward objects. INCORRECT LIFTING TECHNIQUES DO NOT:   Lock your knees when lifting, even if it is a small object.  Bend and twist. Pivot at your feet or move your feet when needing to change directions.  Assume that you cannot safely pick up a paperclip without proper posture.   This information is not intended to replace advice given to you by your health care provider. Make sure you discuss any questions you have with your health care provider.   Document Released: 09/28/2005 Document Revised: 02/12/2015 Document Reviewed: 01/10/2009 [employer]vier Interactive Patient Education [employer].

## 2016-05-26 NOTE — Assessment & Plan Note (Signed)
Symptoms and exam consistent with sciatica with concern for possible disc pathology given radiculopathy. Start prednisone taper. Encouraged nonpharmacological treatments including ice, heat, and home exercise therapy. Continue previously prescribed cyclobenzaprine as needed for muscle spasm. Follow-up in 3 weeks or sooner if symptoms worsen or do not improve.

## 2016-06-10 ENCOUNTER — Ambulatory Visit: Payer: BC Managed Care – PPO | Admitting: Pulmonary Disease

## 2016-07-14 ENCOUNTER — Ambulatory Visit: Payer: BC Managed Care – PPO | Admitting: Pulmonary Disease

## 2016-07-28 ENCOUNTER — Encounter: Payer: Self-pay | Admitting: Internal Medicine

## 2016-07-28 NOTE — Telephone Encounter (Signed)
Please advise 

## 2016-08-04 ENCOUNTER — Ambulatory Visit: Payer: BC Managed Care – PPO | Admitting: Pulmonary Disease

## 2016-08-15 ENCOUNTER — Other Ambulatory Visit: Payer: Self-pay | Admitting: Internal Medicine

## 2016-09-23 ENCOUNTER — Ambulatory Visit: Payer: BC Managed Care – PPO | Admitting: Internal Medicine

## 2016-10-12 DIAGNOSIS — E119 Type 2 diabetes mellitus without complications: Secondary | ICD-10-CM | POA: Insufficient documentation

## 2016-10-12 DIAGNOSIS — E1122 Type 2 diabetes mellitus with diabetic chronic kidney disease: Secondary | ICD-10-CM | POA: Insufficient documentation

## 2016-10-12 DIAGNOSIS — Z794 Long term (current) use of insulin: Secondary | ICD-10-CM | POA: Insufficient documentation

## 2016-10-12 DIAGNOSIS — N184 Chronic kidney disease, stage 4 (severe): Secondary | ICD-10-CM | POA: Insufficient documentation

## 2016-10-12 NOTE — Progress Notes (Signed)
Subjective:    Patient ID: Frederick Fox, male    DOB: [birth year], 51 y.o.   MRN: [id number]  HPI The patient is here for follow up.  Prediabetes:  He is compliant with a low sugar/carbohydrate diet.  He is exercising regularly.  Hypertension: He is taking his medication daily. He is compliant with a low sodium diet.  He denies chest pain, palpitations, edema, shortness of breath (except asthma related) and regular headaches. He is exercising regularly.  He does monitor his blood pressure at home - around 140/90 - sometimes a little less - it is usually 90 on the bottom.    Depression: He is taking his medication daily as prescribed. He denies any side effects from the medication. He feels his depression is well controlled and he is happy with his current dose of medication.   OSA:  He uses cpap nightly.    GERD:  He still gets a tickle in his throat at night which makes him cough.  He increased the zantac to two a day - it thinks that helped, but still has it.  It does not occur nightly, 4/7 days. It does not occur during the day.   Medications and allergies reviewed with patient and updated if appropriate.  Patient Active Problem List   Diagnosis Date Noted  . Prediabetes 10/12/2016  . Sciatica of right side 05/26/2016  . Neck pain, chronic 05/13/2016  . Nevus 05/13/2016  . Exercise-induced asthma 03/24/2016  . Hiatal hernia 03/24/2016  . OSA (obstructive sleep apnea) 01/21/2016  . Chronic renal insufficiency, stage III (moderate) 01/31/2015  . High-renin essential hypertension 08/27/2013  . Depression 08/22/2013  . MICROSCOPIC HEMATURIA 10/23/2010  . HEMATOSPERMIA 10/23/2010  . GOUT 01/10/2009  . Proteinuria 10/09/2008  . Essential hypertension 03/29/2008  . FREQUENCY, URINARY 04/19/2007    Current Outpatient Prescriptions on File Prior to Visit  Medication Sig Dispense Refill  . acetaminophen (TYLENOL) 500 MG tablet Take 1,000 mg by mouth every 6 (six) hours as  needed for mild pain.    [name] albuterol (PROAIR HFA) 108 (90 Base) MCG/ACT inhaler Inhale 1-2 puffs into the lungs every 6 (six) hours as needed. (Patient taking differently: Inhale 2 puffs into the lungs every 6 (six) hours as needed for wheezing or shortness of breath. ) 1 Inhaler 5  . amLODipine (NORVASC) 5 MG tablet Take 1 tablet (5 mg total) by mouth daily. (Patient taking differently: Take 5 mg by mouth at bedtime. ) 90 tablet 3  . cyclobenzaprine (FLEXERIL) 10 MG tablet Take 0.5-1 tablet (5-10 mg) by mouth BID PRN for back pain 20 tablet 0  . FLUoxetine (PROZAC) 20 MG tablet Take 1 tablet (20 mg total) by mouth daily. (Patient taking differently: Take 20 mg by mouth at bedtime. ) 90 tablet 3  . fluticasone (FLONASE) 50 MCG/ACT nasal spray Place 2 sprays into both nostrils daily. (Patient taking differently: Place 2 sprays into both nostrils at bedtime. ) 16 g 6  . lidocaine (LIDODERM) 5 % Place 1 patch onto the skin daily. Remove & Discard patch within 12 hours or as directed by MD 7 patch 0  . losartan-hydrochlorothiazide (HYZAAR) 100-25 MG tablet TAKE ONE TABLET BY MOUTH ONCE DAILY 90 tablet 1  . ranitidine (ZANTAC) 150 MG capsule Take 1 capsule (150 mg total) by mouth every evening. 30 capsule 5   No current facility-administered medications on file prior to visit.     Past Medical History:  Diagnosis Date  . Depression   .  Hypertension   . Proteinuria 2005    Past Surgical History:  Procedure Laterality Date  . RENAL BIOPSY  2005   protien in urine, no pathology- nephrologist in [place] History  . Marital status: Married    Spouse name: N/A  . Number of children: N/A  . Years of education: N/A   Social History Main Topics  . Smoking status: Current Some Day Smoker    Types: Cigars  . Smokeless tobacco: Never Used  . Alcohol use Yes  . Drug use: No  . Sexual activity: Not on file   Other Topics Concern  . Not on file   Social History  Narrative   Low salt diet           Family History  Problem Relation Age of Onset  . Hypertension Father   . Hypertension Mother     Review of Systems  Constitutional: Negative for fever.  Respiratory: Positive for shortness of breath (exercise induced). Negative for cough and wheezing.   Cardiovascular: Negative for chest pain, palpitations and leg swelling.  Neurological: Negative for light-headedness and headaches.       Objective:   Vitals:   10/13/16 0814  BP: 140/90  Pulse: 84  Resp: 16  Temp: 98.2 F (36.8 C)   Filed Weights   10/13/16 0814  Weight: 226 lb (102.5 kg)   Body mass index is 32.43 kg/m.   Physical Exam    Constitutional: Appears well-developed and well-nourished. No distress.  HENT:  Head: Normocephalic and atraumatic.  Neck: Neck supple. No tracheal deviation present. No thyromegaly present.  No cervical lymphadenopathy Cardiovascular: Normal rate, regular rhythm and normal heart sounds.   No murmur heard. No carotid bruit .  No edema Pulmonary/Chest: Effort normal and breath sounds normal. No respiratory distress. No has no wheezes. No rales.  Skin: Skin is warm and dry. Not diaphoretic.  Psychiatric: Normal mood and affect. Behavior is normal.      Assessment & Plan:    See Problem List for Assessment and Plan of chronic medical problems.   FU in 6 months - CPE

## 2016-10-12 NOTE — Patient Instructions (Addendum)
  Test(s) ordered today. Your results will be released to [facility] (or called to you) after review, usually within 72hours after test completion. If any changes need to be made, you will be notified at that same time.  All other Health Maintenance issues reviewed.   All recommended immunizations and age-appropriate screenings are up-to-date or discussed.  Flu immunization administered today.   Medications reviewed and updated.  Changes include increasing zantac to twice a day.  Increase the amlodipine to 10 mg a day.    Your prescription(s) have been submitted to your pharmacy. Please take as directed and contact our office if you believe you are having problem(s) with the medication(s).   Please followup in 6 months

## 2016-10-13 ENCOUNTER — Ambulatory Visit (INDEPENDENT_AMBULATORY_CARE_PROVIDER_SITE_OTHER): Payer: BC Managed Care – PPO | Admitting: Internal Medicine

## 2016-10-13 ENCOUNTER — Encounter: Payer: Self-pay | Admitting: Internal Medicine

## 2016-10-13 ENCOUNTER — Other Ambulatory Visit (INDEPENDENT_AMBULATORY_CARE_PROVIDER_SITE_OTHER): Payer: BC Managed Care – PPO

## 2016-10-13 VITALS — BP 140/90 | HR 84 | Temp 98.2°F | Resp 16 | Wt 226.0 lb

## 2016-10-13 DIAGNOSIS — F32A Depression, unspecified: Secondary | ICD-10-CM

## 2016-10-13 DIAGNOSIS — Z23 Encounter for immunization: Secondary | ICD-10-CM | POA: Diagnosis not present

## 2016-10-13 DIAGNOSIS — R7303 Prediabetes: Secondary | ICD-10-CM

## 2016-10-13 DIAGNOSIS — N183 Chronic kidney disease, stage 3 unspecified: Secondary | ICD-10-CM

## 2016-10-13 DIAGNOSIS — F329 Major depressive disorder, single episode, unspecified: Secondary | ICD-10-CM

## 2016-10-13 DIAGNOSIS — I10 Essential (primary) hypertension: Secondary | ICD-10-CM | POA: Diagnosis not present

## 2016-10-13 DIAGNOSIS — J45990 Exercise induced bronchospasm: Secondary | ICD-10-CM

## 2016-10-13 LAB — HEMOGLOBIN A1C: HEMOGLOBIN A1C: 5.7 % (ref 4.6–6.5)

## 2016-10-13 LAB — LIPID PANEL
CHOL/HDL RATIO: 5
CHOLESTEROL: 211 mg/dL — AB (ref 0–200)
HDL: 38.9 mg/dL — AB (ref >39.00)
NonHDL: 171.95
Triglycerides: 258 mg/dL — ABNORMAL HIGH (ref 0.0–149.0)
VLDL: 51.6 mg/dL — AB (ref 0.0–40.0)

## 2016-10-13 LAB — COMPREHENSIVE METABOLIC PANEL
ALBUMIN: 4.3 g/dL (ref 3.5–5.2)
ALT: 12 U/L (ref 0–53)
AST: 17 U/L (ref 0–37)
Alkaline Phosphatase: 70 U/L (ref 39–117)
BUN: 33 mg/dL — ABNORMAL HIGH (ref 6–23)
CALCIUM: 9.5 mg/dL (ref 8.4–10.5)
CHLORIDE: 104 meq/L (ref 96–112)
CO2: 29 meq/L (ref 19–32)
Creatinine, Ser: 2.29 mg/dL — ABNORMAL HIGH (ref 0.40–1.50)
GFR: 32.41 mL/min — AB (ref >60.00)
Glucose, Bld: 96 mg/dL (ref 70–99)
POTASSIUM: 3.9 meq/L (ref 3.5–5.1)
Sodium: 140 mEq/L (ref 135–145)
Total Bilirubin: 0.3 mg/dL (ref 0.2–1.2)
Total Protein: 7.1 g/dL (ref 6.0–8.3)

## 2016-10-13 LAB — CBC WITH DIFFERENTIAL/PLATELET
BASOS PCT: 0.5 % (ref 0.0–3.0)
Basophils Absolute: 0 10*3/uL (ref 0.0–0.1)
EOS ABS: 0.3 10*3/uL (ref 0.0–0.7)
Eosinophils Relative: 3 % (ref 0.0–5.0)
HEMATOCRIT: 39 % (ref 39.0–52.0)
Hemoglobin: 13.4 g/dL (ref 13.0–17.0)
LYMPHS ABS: 1.9 10*3/uL (ref 0.7–4.0)
LYMPHS PCT: 22 % (ref 12.0–46.0)
MCHC: 34.2 g/dL (ref 30.0–36.0)
MCV: 78.6 fl (ref 78.0–100.0)
Monocytes Absolute: 0.6 10*3/uL (ref 0.1–1.0)
Monocytes Relative: 6.9 % (ref 3.0–12.0)
NEUTROS ABS: 6 10*3/uL (ref 1.4–7.7)
NEUTROS PCT: 67.6 % (ref 43.0–77.0)
PLATELETS: 253 10*3/uL (ref 150.0–400.0)
RBC: 4.97 Mil/uL (ref 4.22–5.81)
RDW: 13.8 % (ref 11.5–15.5)
WBC: 8.8 10*3/uL (ref 4.0–10.5)

## 2016-10-13 LAB — LDL CHOLESTEROL, DIRECT: LDL DIRECT: 128 mg/dL

## 2016-10-13 MED ORDER — AMLODIPINE BESYLATE 10 MG PO TABS: 10.0000 mg | ORAL_TABLET | Freq: Every day | ORAL | 3 refills | Status: DC

## 2016-10-13 MED ORDER — RANITIDINE HCL 150 MG PO CAPS: 150.0000 mg | ORAL_CAPSULE | Freq: Two times a day (BID) | ORAL | 1 refills | Status: DC

## 2016-10-13 NOTE — Assessment & Plan Note (Signed)
Check a1c Low sugar/carb diet, exercise Weight loss

## 2016-10-13 NOTE — Assessment & Plan Note (Signed)
Uses inhaler as needed-effective Continue as above

## 2016-10-13 NOTE — Assessment & Plan Note (Signed)
Controlled, stable Continue current dose of medication  

## 2016-10-13 NOTE — Assessment & Plan Note (Signed)
CMP today Will increase amlodipine to better control blood pressure

## 2016-10-13 NOTE — Assessment & Plan Note (Signed)
Not ideally controlled Increase amlodipine to 10 mg daily Continue current dose of hyzaar cmp today

## 2016-10-13 NOTE — Progress Notes (Signed)
Pre visit review using our clinic review tool, if applicable. No additional management support is needed unless otherwise documented below in the visit note. 

## 2017-02-16 ENCOUNTER — Encounter: Payer: Self-pay | Admitting: Internal Medicine

## 2017-02-16 ENCOUNTER — Ambulatory Visit (INDEPENDENT_AMBULATORY_CARE_PROVIDER_SITE_OTHER): Payer: BC Managed Care – PPO | Admitting: Internal Medicine

## 2017-02-16 VITALS — BP 134/82 | HR 78 | Temp 98.6°F | Resp 16 | Wt 230.0 lb

## 2017-02-16 DIAGNOSIS — R519 Headache, unspecified: Secondary | ICD-10-CM

## 2017-02-16 DIAGNOSIS — I10 Essential (primary) hypertension: Secondary | ICD-10-CM | POA: Diagnosis not present

## 2017-02-16 DIAGNOSIS — R51 Headache: Secondary | ICD-10-CM | POA: Diagnosis not present

## 2017-02-16 MED ORDER — NEBIVOLOL HCL 5 MG PO TABS: 5.0000 mg | ORAL_TABLET | Freq: Every day | ORAL | 5 refills | Status: DC

## 2017-02-16 MED ORDER — FLUOXETINE HCL 40 MG PO CAPS: 40.0000 mg | ORAL_CAPSULE | Freq: Every day | ORAL | 3 refills | Status: DC

## 2017-02-16 NOTE — Assessment & Plan Note (Signed)
BP elevated at home, but ok here today Need BP to be < 130/80 given CKD Start bystolic 5 mg daily Continue other medications FU in 3  Months, sooner if needed Continue regular exercise Work on weight loss - dec portions

## 2017-02-16 NOTE — Progress Notes (Signed)
Subjective:    Patient ID: Frederick Fox, male    DOB: 1966/03/19, 51 y.o.   MRN: [id number]  HPI The patient is here for an acute visit.  Headaches:  He has had headaches daily for three days. It has been constant.  The pain is in the back of his head, which is where he has his "BP headaches" in the past.  Tylenol did not help.  He has checked his BP at home and it has been elevated.  He has some increased stress, but denies other symptoms or reasons for the headaches.    His BP at home has been 140/80, 138/84.  He is exercising regularly.  He is very compliant with a low sodium diet.  He has gained weight.  He is trying to eat healthy.  He denies chest pain, palpitations, and SOB.  Medications and allergies reviewed with patient and updated if appropriate.  Patient Active Problem List   Diagnosis Date Noted  . Headache 02/16/2017  . Prediabetes 10/12/2016  . Sciatica of right side 05/26/2016  . Neck pain, chronic 05/13/2016  . Nevus 05/13/2016  . Exercise-induced asthma 03/24/2016  . Hiatal hernia 03/24/2016  . OSA (obstructive sleep apnea) 01/21/2016  . Chronic renal insufficiency, stage III (moderate) 01/31/2015  . High-renin essential hypertension 08/27/2013  . Depression 08/22/2013  . MICROSCOPIC HEMATURIA 10/23/2010  . HEMATOSPERMIA 10/23/2010  . GOUT 01/10/2009  . Proteinuria 10/09/2008  . Essential hypertension 03/29/2008  . FREQUENCY, URINARY 04/19/2007    Current Outpatient Prescriptions on File Prior to Visit  Medication Sig Dispense Refill  . acetaminophen (TYLENOL) 500 MG tablet Take 1,000 mg by mouth every 6 (six) hours as needed for mild pain.    [name] albuterol (PROAIR HFA) 108 (90 Base) MCG/ACT inhaler Inhale 1-2 puffs into the lungs every 6 (six) hours as needed. (Patient taking differently: Inhale 2 puffs into the lungs every 6 (six) hours as needed for wheezing or shortness of breath. ) 1 Inhaler 5  . amLODipine (NORVASC) 10 MG tablet Take 1 tablet (10  mg total) by mouth daily. 90 tablet 3  . cyclobenzaprine (FLEXERIL) 10 MG tablet Take 0.5-1 tablet (5-10 mg) by mouth BID PRN for back pain 20 tablet 0  . fluticasone (FLONASE) 50 MCG/ACT nasal spray Place 2 sprays into both nostrils daily. (Patient taking differently: Place 2 sprays into both nostrils at bedtime. ) 16 g 6  . lidocaine (LIDODERM) 5 % Place 1 patch onto the skin daily. Remove & Discard patch within 12 hours or as directed by MD 7 patch 0  . losartan-hydrochlorothiazide (HYZAAR) 100-25 MG tablet TAKE ONE TABLET BY MOUTH ONCE DAILY 90 tablet 1  . ranitidine (ZANTAC) 150 MG capsule Take 1 capsule (150 mg total) by mouth 2 (two) times daily. 180 capsule 1  . [DISCONTINUED] FLUoxetine (PROZAC) 20 MG tablet Take 1 tablet (20 mg total) by mouth daily. (Patient taking differently: Take 20 mg by mouth at bedtime. ) 90 tablet 3   No current facility-administered medications on file prior to visit.     Past Medical History:  Diagnosis Date  . Depression   . Hypertension   . Proteinuria 2005    Past Surgical History:  Procedure Laterality Date  . RENAL BIOPSY  2005   protien in urine, no pathology- nephrologist in [place] History  . Marital status: Married    Spouse name: N/A  . Number of children: N/A  .  Years of education: N/A   Social History Main Topics  . Smoking status: Current Some Day Smoker    Types: Cigars  . Smokeless tobacco: Never Used  . Alcohol use Yes  . Drug use: No  . Sexual activity: Not Asked   Other Topics Concern  . None   Social History Narrative   Low salt diet           Family History  Problem Relation Age of Onset  . Hypertension Father   . Hypertension Mother     Review of Systems  Constitutional: Negative for fever.  Respiratory: Negative for cough, shortness of breath and wheezing.   Cardiovascular: Negative for chest pain and palpitations.  Neurological: Positive for light-headedness and  headaches.       Objective:   Vitals:   02/16/17 1535  BP: 134/82  Pulse: 78  Resp: 16  Temp: 98.6 F (37 C)   Wt Readings from Last 3 Encounters:  02/16/17 230 lb (104.3 kg)  10/13/16 226 lb (102.5 kg)  05/26/16 215 lb (97.5 kg)   Body mass index is 33 kg/m.   Physical Exam    Constitutional: Appears well-developed and well-nourished. No distress.  HENT:  Head: Normocephalic and atraumatic.  Neck: Neck supple. No tracheal deviation present. No thyromegaly present.  No cervical lymphadenopathy Cardiovascular: Normal rate, regular rhythm and normal heart sounds.   No murmur heard. No carotid bruit .  No edema Pulmonary/Chest: Effort normal and breath sounds normal. No respiratory distress. No has no wheezes. No rales.  Skin: Skin is warm and dry. Not diaphoretic.  Psychiatric: Normal mood and affect. Behavior is normal.      Assessment & Plan:    See Problem List for Assessment and Plan of chronic medical problems.

## 2017-02-16 NOTE — Assessment & Plan Note (Signed)
?   Related to slightly elevated BP - not likely but BP still needs to be lowered Has had increased stress which may be a cause Tylenol prn for headaches Call or return if no improvement

## 2017-02-16 NOTE — Progress Notes (Signed)
Pre visit review using our clinic review tool, if applicable. No additional management support is needed unless otherwise documented below in the visit note. 

## 2017-02-16 NOTE — Patient Instructions (Addendum)
   Medications reviewed and updated.  Changes include starting bystolic 5 mg daily.  Your prescription(s) have been submitted to your pharmacy. Please take as directed and contact our office if you believe you are having problem(s) with the medication(s).    Follow up in 3 months

## 2017-05-17 NOTE — Patient Instructions (Addendum)
  Test(s) ordered today. Your results will be released to [facility] (or called to you) after review, usually within 72hours after test completion. If any changes need to be made, you will be notified at that same time.  All other Health Maintenance issues reviewed.   All recommended immunizations and age-appropriate screenings are up-to-date or discussed.  No immunizations administered today.   Medications reviewed and updated.   No changes recommended at this time.   A referral was ordered for neurology  Please followup in 6 months

## 2017-05-17 NOTE — Progress Notes (Signed)
Subjective:    Patient ID: Frederick SNARSKI, male    DOB: 04-01-1966, 51 y.o.   MRN: [id number]  HPI The patient is here for follow up.  Hypertension: He is taking his medication daily. He is compliant with a low sodium diet.  He denies chest pain, palpitations, edema, shortness of breath and regular headaches. He is exercising regularly.      GERD:  He is taking his medication daily as prescribed.  He denies any GERD symptoms and feels his GERD is well controlled.   Exercise induce asthma:  He feels his asthma is well controlled. He denies any shortness of breath, coughing or wheezing regular basis. He has not needed to use his rescue inhaler regular basis. He uses it rarely.   CKD:  He is not taking any nsaids.  He drinks plenty of water during the day.   Depression: He is taking his medication daily as prescribed. He denies any side effects from the medication. He feels his depression is well controlled and he is happy with his current dose of medication. He seeing a therapist  Prediabetes:  He is compliant with a low sugar/carbohydrate diet.  He is exercising regularly.  B/l Hands sometimes feel tight like he is wearing a glove. Left hand > right.  This is worse since school ended, but this has been going on for a while.    He denies that sensation in his feet.  His head feels heavy to hold at times.  Sometimes his first two fingers on his left hand lock and his left side of his neck can lock up at times.  It is painful.  He has chronic neck pain.   Tingling in right 4-5 th in fingers intermittently - that is new.  He denies weakness in his hands.  When he writes on the board at school his arms will feel fatigued.     Medications and allergies reviewed with patient and updated if appropriate.  Patient Active Problem List   Diagnosis Date Noted  . Headache 02/16/2017  . Prediabetes 10/12/2016  . Sciatica of right side 05/26/2016  . Neck pain, chronic 05/13/2016  . Nevus  05/13/2016  . Exercise-induced asthma 03/24/2016  . Hiatal hernia 03/24/2016  . OSA (obstructive sleep apnea) 01/21/2016  . Chronic renal insufficiency, stage III (moderate) 01/31/2015  . High-renin essential hypertension 08/27/2013  . Depression 08/22/2013  . MICROSCOPIC HEMATURIA 10/23/2010  . HEMATOSPERMIA 10/23/2010  . GOUT 01/10/2009  . Proteinuria 10/09/2008  . Essential hypertension 03/29/2008  . FREQUENCY, URINARY 04/19/2007    Current Outpatient Prescriptions on File Prior to Visit  Medication Sig Dispense Refill  . acetaminophen (TYLENOL) 500 MG tablet Take 1,000 mg by mouth every 6 (six) hours as needed for mild pain.    [name] albuterol (PROAIR HFA) 108 (90 Base) MCG/ACT inhaler Inhale 1-2 puffs into the lungs every 6 (six) hours as needed. (Patient taking differently: Inhale 2 puffs into the lungs every 6 (six) hours as needed for wheezing or shortness of breath. ) 1 Inhaler 5  . amLODipine (NORVASC) 10 MG tablet Take 1 tablet (10 mg total) by mouth daily. 90 tablet 3  . cyclobenzaprine (FLEXERIL) 10 MG tablet Take 0.5-1 tablet (5-10 mg) by mouth BID PRN for back pain 20 tablet 0  . FLUoxetine (PROZAC) 40 MG capsule Take 1 capsule (40 mg total) by mouth daily. 90 capsule 3  . fluticasone (FLONASE) 50 MCG/ACT nasal spray Place 2 sprays into both nostrils daily. (  Patient taking differently: Place 2 sprays into both nostrils at bedtime. ) 16 g 6  . lidocaine (LIDODERM) 5 % Place 1 patch onto the skin daily. Remove & Discard patch within 12 hours or as directed by MD 7 patch 0  . losartan-hydrochlorothiazide (HYZAAR) 100-25 MG tablet TAKE ONE TABLET BY MOUTH ONCE DAILY 90 tablet 1  . nebivolol (BYSTOLIC) 5 MG tablet Take 1 tablet (5 mg total) by mouth daily. 30 tablet 5  . ranitidine (ZANTAC) 150 MG capsule Take 1 capsule (150 mg total) by mouth 2 (two) times daily. 180 capsule 1   No current facility-administered medications on file prior to visit.     Past Medical History:    Diagnosis Date  . Depression   . Hypertension   . Proteinuria 2005    Past Surgical History:  Procedure Laterality Date  . RENAL BIOPSY  2005   protien in urine, no pathology- nephrologist in [place] History  . Marital status: Married    Spouse name: N/A  . Number of children: N/A  . Years of education: N/A   Social History Main Topics  . Smoking status: Current Some Day Smoker    Types: Cigars  . Smokeless tobacco: Never Used  . Alcohol use Yes  . Drug use: No  . Sexual activity: Not Asked   Other Topics Concern  . None   Social History Narrative   Low salt diet           Family History  Problem Relation Age of Onset  . Hypertension Father   . Hypertension Mother     Review of Systems  Constitutional: Negative for chills and fever.  Respiratory: Negative for cough, shortness of breath and wheezing.   Cardiovascular: Negative for chest pain, palpitations and leg swelling.  Neurological: Negative for light-headedness and headaches.       Objective:   Vitals:   05/18/17 1115  BP: 120/66  Pulse: 98  Resp: 16  Temp: 98.6 F (37 C)   Wt Readings from Last 3 Encounters:  05/18/17 234 lb (106.1 kg)  02/16/17 230 lb (104.3 kg)  10/13/16 226 lb (102.5 kg)   Body mass index is 33.58 kg/m.   Physical Exam    Constitutional: Appears well-developed and well-nourished. No distress.  HENT:  Head: Normocephalic and atraumatic.  Neck: Neck supple. No tracheal deviation present. No thyromegaly present.  No cervical lymphadenopathy Cardiovascular: Normal rate, regular rhythm and normal heart sounds.   No murmur heard. No carotid bruit .  No edema Pulmonary/Chest: Effort normal and breath sounds normal. No respiratory distress. No has no wheezes. No rales.  Skin: Skin is warm and dry. Not diaphoretic.  Psychiatric: Normal mood and affect. Behavior is normal.      Assessment & Plan:    See Problem List for Assessment  and Plan of chronic medical problems.

## 2017-05-18 ENCOUNTER — Other Ambulatory Visit (INDEPENDENT_AMBULATORY_CARE_PROVIDER_SITE_OTHER): Payer: BC Managed Care – PPO

## 2017-05-18 ENCOUNTER — Ambulatory Visit (INDEPENDENT_AMBULATORY_CARE_PROVIDER_SITE_OTHER): Payer: BC Managed Care – PPO | Admitting: Internal Medicine

## 2017-05-18 ENCOUNTER — Encounter: Payer: Self-pay | Admitting: Internal Medicine

## 2017-05-18 ENCOUNTER — Encounter: Payer: Self-pay | Admitting: Neurology

## 2017-05-18 VITALS — BP 120/66 | HR 98 | Temp 98.6°F | Resp 16 | Ht 70.0 in | Wt 234.0 lb

## 2017-05-18 DIAGNOSIS — N183 Chronic kidney disease, stage 3 unspecified: Secondary | ICD-10-CM

## 2017-05-18 DIAGNOSIS — R7303 Prediabetes: Secondary | ICD-10-CM

## 2017-05-18 DIAGNOSIS — F32A Depression, unspecified: Secondary | ICD-10-CM

## 2017-05-18 DIAGNOSIS — R29898 Other symptoms and signs involving the musculoskeletal system: Secondary | ICD-10-CM | POA: Insufficient documentation

## 2017-05-18 DIAGNOSIS — J45990 Exercise induced bronchospasm: Secondary | ICD-10-CM | POA: Diagnosis not present

## 2017-05-18 DIAGNOSIS — I10 Essential (primary) hypertension: Secondary | ICD-10-CM

## 2017-05-18 DIAGNOSIS — R202 Paresthesia of skin: Secondary | ICD-10-CM | POA: Diagnosis not present

## 2017-05-18 DIAGNOSIS — R7989 Other specified abnormal findings of blood chemistry: Secondary | ICD-10-CM | POA: Diagnosis not present

## 2017-05-18 DIAGNOSIS — F329 Major depressive disorder, single episode, unspecified: Secondary | ICD-10-CM | POA: Diagnosis not present

## 2017-05-18 LAB — VITAMIN B12: Vitamin B-12: 541 pg/mL (ref 211–911)

## 2017-05-18 LAB — COMPREHENSIVE METABOLIC PANEL
ALBUMIN: 4.2 g/dL (ref 3.5–5.2)
ALT: 14 U/L (ref 0–53)
AST: 14 U/L (ref 0–37)
Alkaline Phosphatase: 65 U/L (ref 39–117)
BUN: 46 mg/dL — AB (ref 6–23)
CALCIUM: 9.2 mg/dL (ref 8.4–10.5)
CHLORIDE: 102 meq/L (ref 96–112)
CO2: 29 mEq/L (ref 19–32)
Creatinine, Ser: 2.41 mg/dL — ABNORMAL HIGH (ref 0.40–1.50)
GFR: 30.48 mL/min — ABNORMAL LOW (ref >60.00)
Glucose, Bld: 115 mg/dL — ABNORMAL HIGH (ref 70–99)
POTASSIUM: 4.1 meq/L (ref 3.5–5.1)
SODIUM: 138 meq/L (ref 135–145)
Total Bilirubin: 0.4 mg/dL (ref 0.2–1.2)
Total Protein: 7.2 g/dL (ref 6.0–8.3)

## 2017-05-18 LAB — LIPID PANEL
CHOL/HDL RATIO: 6
CHOLESTEROL: 240 mg/dL — AB (ref 0–200)
HDL: 39.8 mg/dL (ref >39.00)
Triglycerides: 538 mg/dL — ABNORMAL HIGH (ref 0.0–149.0)

## 2017-05-18 LAB — LDL CHOLESTEROL, DIRECT: Direct LDL: 117 mg/dL

## 2017-05-18 LAB — HEMOGLOBIN A1C: HEMOGLOBIN A1C: 6.1 % (ref 4.6–6.5)

## 2017-05-18 NOTE — Assessment & Plan Note (Signed)
?   Related cervical radiculopathy Will refer to neurology for further evaluation

## 2017-05-18 NOTE — Assessment & Plan Note (Signed)
BP well controlled Current regimen effective and well tolerated Continue current medications at current doses cmp  

## 2017-05-18 NOTE — Assessment & Plan Note (Signed)
Well-controlled Continue inhaler as needed

## 2017-05-18 NOTE — Assessment & Plan Note (Signed)
Check B12 level Concern for cervical radiculopathy A CT scan of the C-spine 2013-possibly progression of disease Referred to neurology

## 2017-05-18 NOTE — Assessment & Plan Note (Signed)
Check a1c Low sugar / carb diet Stressed regular exercise, weight loss  

## 2017-05-18 NOTE — Assessment & Plan Note (Signed)
Controlled, stable Continue current dose of medication  

## 2017-05-18 NOTE — Assessment & Plan Note (Signed)
Check CMP Avoid NSAIDs

## 2017-05-22 ENCOUNTER — Encounter: Payer: Self-pay | Admitting: Internal Medicine

## 2017-06-01 ENCOUNTER — Other Ambulatory Visit: Payer: Self-pay | Admitting: Internal Medicine

## 2017-07-20 ENCOUNTER — Ambulatory Visit (INDEPENDENT_AMBULATORY_CARE_PROVIDER_SITE_OTHER): Payer: BC Managed Care – PPO | Admitting: Neurology

## 2017-07-20 ENCOUNTER — Encounter: Payer: Self-pay | Admitting: Neurology

## 2017-07-20 VITALS — BP 110/70 | HR 65 | Ht 70.0 in | Wt 240.4 lb

## 2017-07-20 DIAGNOSIS — R202 Paresthesia of skin: Secondary | ICD-10-CM

## 2017-07-20 DIAGNOSIS — M4302 Spondylolysis, cervical region: Secondary | ICD-10-CM

## 2017-07-20 NOTE — Progress Notes (Signed)
[facility] Clinic Note - Initial Visit   Date: 07/20/17  Frederick Fox MRN: [id number] DOB: 1966-03-22   Dear Dr. Quay Burow:  Thank you for your kind referral of Frederick Fox for consultation of bilateral arm paresthesias and left neck pain. Although his history is well known to you, please allow [name] to reiterate it for the purpose of our medical record. The patient was accompanied to the clinic by self.   History of Present Illness: Frederick Fox is a 51 y.o. right-handed Caucasian male with prediabetes, exercise-induced asthma, OSA, stage III CKD, hypertension, depression, and GERD presenting for evaluation of bilateral hand paresthesias.  He is a [employer].  During the summer of 2018, he started having sensation that hands were always in gloves, which was intermittent at first but has become more constant.  Symptoms are slightly improved by moving and shaking his hands.  He occasionally has painful cramps of the left hand with his hand locked in a certain position, such as when brushing his teeth or holding his phone.  Cramps are worse in the left.    He has about 5 year history of neck pain.  Pain is dull and achy and constant.  He also occasionally gets neck spasm when his neck is positioned in a certain way.  He has fatigue when he is writing on the board and often has to take breaks when writing.  There is no pins or needle sensation when this occurs.  In 2013, he saw Dr. Joya Salm for similar complaints and had CT myelogram which showed multilevel disc bulge at C3-4, C4-5, and C5-6 where there is anterior cord flattening and stenosis.  He was recommended conservative management.    Out-side paper records, electronic medical record, and images have been reviewed where available and summarized as:  Lab Results  Component Value Date   HGBA1C 6.1 05/18/2017   Lab Results  Component Value Date   TSH 1.74 09/20/2015   Lab Results  Component  Value Date   VITAMINB12 541 05/18/2017   CT myelogram 04/13/2012: 1.  Broad bulge C3-4 with mild anterior cord flattening. 2.  Right parasagittal protrusions at C4-5 and C5-6, with partially ossified extension behind C4, C5, and C6, resulting in right anterior cord flattening and mild stenosis at the level of both interspaces.  Past Medical History:  Diagnosis Date  . Depression   . Hypertension   . Proteinuria 2005    Past Surgical History:  Procedure Laterality Date  . RENAL BIOPSY  2005   protien in urine, no pathology- nephrologist in [place]     Medications:  Outpatient Encounter Prescriptions as of 07/20/2017  Medication Sig  . acetaminophen (TYLENOL) 500 MG tablet Take 1,000 mg by mouth every 6 (six) hours as needed for mild pain.  [name] albuterol (PROAIR HFA) 108 (90 Base) MCG/ACT inhaler Inhale 1-2 puffs into the lungs every 6 (six) hours as needed. (Patient taking differently: Inhale 2 puffs into the lungs every 6 (six) hours as needed for wheezing or shortness of breath. )  . amLODipine (NORVASC) 10 MG tablet Take 1 tablet (10 mg total) by mouth daily.  [name] FLUoxetine (PROZAC) 40 MG capsule Take 1 capsule (40 mg total) by mouth daily.  . fluticasone (FLONASE) 50 MCG/ACT nasal spray Place 2 sprays into both nostrils daily. (Patient taking differently: Place 2 sprays into both nostrils at bedtime. )  . losartan-hydrochlorothiazide (HYZAAR) 100-25 MG tablet TAKE ONE TABLET BY MOUTH ONCE DAILY  .  nebivolol (BYSTOLIC) 5 MG tablet Take 1 tablet (5 mg total) by mouth daily.  . ranitidine (ZANTAC) 150 MG capsule Take 1 capsule (150 mg total) by mouth 2 (two) times daily.  . cyclobenzaprine (FLEXERIL) 10 MG tablet Take 0.5-1 tablet (5-10 mg) by mouth BID PRN for back pain (Patient not taking: Reported on 07/20/2017)  . lidocaine (LIDODERM) 5 % Place 1 patch onto the skin daily. Remove & Discard patch within 12 hours or as directed by MD (Patient not taking: Reported on 07/20/2017)   No  facility-administered encounter medications on file as of 07/20/2017.     Allergies:  Allergies  Allergen Reactions  . Lisinopril     cough    Family History: Family History  Problem Relation Age of Onset  . Hypertension Father   . Parkinson's disease Father   . Hypertension Mother   . Healthy Brother   . Brain cancer Maternal Grandfather   . Heart attack Paternal Grandfather     Social History: Social History  Substance Use Topics  . Smoking status: Former Smoker    Types: Cigars  . Smokeless tobacco: Never Used  . Alcohol use Yes     Comment: 1-2 glasses on the weekend   Social History   Social History Narrative   Low salt diet.  Lives with wife and children in a 2 story home.  Has 2 stepchildren and 2 children of his own.     Works as a [employer].     Education: college.           Review of Systems:  CONSTITUTIONAL: No fevers, chills, night sweats, or weight loss.   EYES: No visual changes or eye pain ENT: No hearing changes.  No history of nose bleeds.   RESPIRATORY: No cough, wheezing and shortness of breath.   CARDIOVASCULAR: Negative for chest pain, and palpitations.   GI: Negative for abdominal discomfort, blood in stools or black stools.  No recent change in bowel habits.   GU:  No history of incontinence.   MUSCLOSKELETAL: No history of joint pain or swelling.  No myalgias.   SKIN: Negative for lesions, rash, and itching.   HEMATOLOGY/ONCOLOGY: Negative for prolonged bleeding, bruising easily, and swollen nodes.  No history of cancer.   ENDOCRINE: Negative for cold or heat intolerance, polydipsia or goiter.   PSYCH:  No depression or anxiety symptoms.   NEURO: As Above.   Vital Signs:  BP 110/70   Pulse 65   Ht 5\' 10"  (1.778 m)   Wt 240 lb 6 oz (109 kg)   SpO2 97%   BMI 34.49 kg/m    General Medical Exam:   General:  Well appearing, comfortable.   Eyes/ENT: see cranial nerve examination.   Neck: No masses appreciated.  Reduced  range of motion with neck extension and rotation to the right.  No carotid bruits. Respiratory:  Clear to auscultation, good air entry bilaterally.   Cardiac:  Regular rate and rhythm, no murmur.   Extremities:  No deformities, edema, or skin discoloration.  Skin:  No rashes or lesions.  Neurological Exam: MENTAL STATUS including orientation to time, place, person, recent and remote memory, attention span and concentration, language, and fund of knowledge is normal.  Speech is not dysarthric.  CRANIAL NERVES: II:  No visual field defects.  Unremarkable fundi.   III-IV-VI: Pupils equal round and reactive to light.  Normal conjugate, extra-ocular eye movements in all directions of gaze.  No nystagmus.  No  ptosis.   V:  Normal facial sensation.     VII:  Normal facial symmetry and movements.  VIII:  Normal hearing and vestibular function.   IX-X:  Normal palatal movement.   XI:  Normal shoulder shrug and head rotation.   XII:  Normal tongue strength and range of motion, no deviation or fasciculation.  MOTOR:  No atrophy, fasciculations or abnormal movements.  No pronator drift.  Tone is normal.    Right Upper Extremity:    Left Upper Extremity:    Deltoid  5/5   Deltoid  5/5   Biceps  5/5   Biceps  5/5   Triceps  5/5   Triceps  5/5   Wrist extensors  5/5   Wrist extensors  5/5   Wrist flexors  5/5   Wrist flexors  5/5   Finger extensors  5/5   Finger extensors  5/5   Finger flexors  5/5   Finger flexors  5/5   Dorsal interossei  5/5   Dorsal interossei  5/5   Abductor pollicis  5/5   Abductor pollicis  5/5   Tone (Ashworth scale)  0  Tone (Ashworth scale)  0   Right Lower Extremity:    Left Lower Extremity:    Hip flexors  5/5   Hip flexors  5/5   Hip extensors  5/5   Hip extensors  5/5   Knee flexors  5/5   Knee flexors  5/5   Knee extensors  5/5   Knee extensors  5/5   Dorsiflexors  5/5   Dorsiflexors  5/5   Plantarflexors  5/5   Plantarflexors  5/5   Toe extensors  5/5   Toe  extensors  5/5   Toe flexors  5/5   Toe flexors  5/5   Tone (Ashworth scale)  0  Tone (Ashworth scale)  0   MSRs:  Right                                                                 Left brachioradialis 2+  brachioradialis 2+  biceps 1+  biceps 1+  triceps 1+  triceps 1+  patellar 1+  patellar 2+  ankle jerk 2+  ankle jerk 2+  Hoffman no  Hoffman no  plantar response down  plantar response down   SENSORY:  Normal and symmetric perception of light touch, pinprick, vibration, and proprioception.    COORDINATION/GAIT: Normal finger-to- nose-finger and heel-to-shin.  Intact rapid alternating movements bilaterally.  Able to rise from a chair without using arms.  Gait narrow based and stable. Tandem and stressed gait intact.    IMPRESSION: Mr, Mena is a 51 year-old gentleman referred for evaluation of bilateral hand dysesthesias, cramps, and neck pain.  He has known multilevel cervical disc bulge at C3-4, C4-5, and W1-0 which can certainly be contributing to his symptoms.  He will have NCS/EMG of the arms to be sure there is no superimposed nerve entrapment at the elbow or wrist.  He will also have updated MRI cervical spine to assess for worsening spinal canal stenosis.  He does not wish to take muscle relaxants for his neck pain/cramps.  We will plan to set him up with neck physiotherapy, pending the results of his testing.  The duration of this appointment visit was 40 minutes of face-to-face time with the patient.  Greater than 50% of this time was spent in counseling, explanation of diagnosis, planning of further management, and coordination of care.   Thank you for allowing me to participate in patient's care.  If I can answer any additional questions, I would be pleased to do so.    Sincerely,    Duilio Heritage K. Posey Pronto, DO

## 2017-07-20 NOTE — Patient Instructions (Addendum)
1.  NCS/EMG of the arms 2.  MRI cervical spine without contrast  We will call you with the results and let you know the step

## 2017-07-23 ENCOUNTER — Other Ambulatory Visit: Payer: Self-pay | Admitting: Internal Medicine

## 2017-08-17 ENCOUNTER — Ambulatory Visit (INDEPENDENT_AMBULATORY_CARE_PROVIDER_SITE_OTHER): Payer: BC Managed Care – PPO | Admitting: Neurology

## 2017-08-17 DIAGNOSIS — M5412 Radiculopathy, cervical region: Secondary | ICD-10-CM

## 2017-08-17 DIAGNOSIS — M4302 Spondylolysis, cervical region: Secondary | ICD-10-CM | POA: Diagnosis not present

## 2017-08-17 DIAGNOSIS — G5622 Lesion of ulnar nerve, left upper limb: Secondary | ICD-10-CM

## 2017-08-17 NOTE — Procedures (Signed)
[facility]  [address]  [place], [place] [address] Tel: [phone] Fax:  [phone] Test Date:  08/17/2017  Patient: Frederick Fox DOB: [birth year] Physician: Narda Amber, DO  Sex: Male Height: 5\' 10"  Ref Phys: Narda Amber, Lazaro Arms  ID#: [id number] Temp: 36.9C Technician:    Patient Complaints: This is a 51 year-old man referred for evaluation of neck pain, cramps, and paresthesias of the arms.  NCV & EMG Findings: Extensive electrodiagnostic testing of the right upper extremity and additional studies of the left shows:  1. Bilateral median, ulnar, and mixed palmar sensory responses are within normal limits. 2. Left ulnar motor response shows absolute conduction velocity slowing across the elbow of 14 m./s, with normal latency and amplitude. Bilateral median and right ulnar motor responses are within normal limits. 3. Chronic motor axon loss changes are seen affecting bilateral C5 myotomes, without a common active denervation.   Impression: 1. 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  2. Left ulnar neuropathy with slowing across the elbow, purely demyelinating in type. Overall, this finding is mild in degree electrically.   ___________________________ Narda Amber, DO    Nerve Conduction Studies Anti Sensory Summary Table   Site NR Peak (ms) Norm Peak (ms) P-T Amp (V) Norm P-T Amp  Left Median Anti Sensory (2nd Digit)  36.9C  Wrist    2.7 <3.6 32.0 >15  Right Median Anti Sensory (2nd Digit)  36.9C  Wrist    2.5 <3.6 30.4 >15  Left Ulnar Anti Sensory (5th Digit)  36.9C  Wrist    2.5 <3.1 32.7 >10  Right Ulnar Anti Sensory (5th Digit)  36.9C  Wrist    2.5 <3.1 28.7 >10   Motor Summary Table   Site NR Onset (ms) Norm Onset (ms) O-P Amp (mV) Norm O-P Amp Site1 Site2 Delta-0 (ms) Dist (cm) Vel (m/s) Norm Vel (m/s)  Left Median Motor (Abd Poll Brev)  36.9C  Wrist    3.1 <4.0  9.5 >6 Elbow Wrist 4.5 27.0 60 >50  Elbow    7.6  9.3         Right Median Motor (Abd Poll Brev)  36.9C  Wrist    2.7 <4.0 12.3 >6 Elbow Wrist 4.3 26.0 60 >50  Elbow    7.0  11.9         Left Ulnar Motor (Abd Dig Minimi)  36.9C  Wrist    2.2 <3.1 9.9 >7 B Elbow Wrist 3.6 24.0 67 >50  B Elbow    5.8  9.1  A Elbow B Elbow 1.9 10.0 53 >50  A Elbow    7.7  8.2         Right Ulnar Motor (Abd Dig Minimi)  36.9C  Wrist    2.1 <3.1 10.6 >7 B Elbow Wrist 3.7 24.0 65 >50  B Elbow    5.8  9.3  A Elbow B Elbow 1.7 10.0 59 >50  A Elbow    7.5  9.1          Comparison Summary Table   Site NR Peak (ms) Norm Peak (ms) P-T Amp (V) Site1 Site2 Delta-P (ms) Norm Delta (ms)  Left Median/Ulnar Palm Comparison (Wrist - 8cm)  36.9C  Median Palm    1.5 <2.2 37.4 Median Palm Ulnar Palm 0.0   Ulnar Palm    1.5 <2.2 8.4      Right Median/Ulnar Palm Comparison (Wrist - 8cm)  36.[place]  Median Palm    1.5 <2.2 52.6 Median Palm Ulnar Palm 0.2   Ulnar Palm    1.3 <2.2 7.7       EMG   Side Muscle Ins Act Fibs Psw Fasc Number Recrt Dur Dur. Amp Amp. Poly Poly. Comment  Right 1stDorInt Nml Nml Nml Nml Nml Nml Nml Nml Nml Nml Nml Nml N/A  Right Ext Indicis Nml Nml Nml Nml Nml Nml Nml Nml Nml Nml Nml Nml N/A  Right Biceps Nml Nml Nml Nml 1- Rapid Some 1+ Some 1+ Nml Nml N/A  Right PronatorTeres Nml Nml Nml Nml Nml Nml Nml Nml Nml Nml Nml Nml N/A  Right Triceps Nml Nml Nml Nml Nml Nml Nml Nml Nml Nml Nml Nml N/A  Right Deltoid Nml Nml Nml Nml 1- Rapid Some 1+ Some 1+ Nml Nml N/A  Left Deltoid Nml Nml Nml Nml 1- Rapid Some 1+ Some 1+ Nml Nml N/A  Left 1stDorInt Nml Nml Nml Nml Nml Nml Nml Nml Nml Nml Nml Nml N/A  Left Ext Indicis Nml Nml Nml Nml Nml Nml Nml Nml Nml Nml Nml Nml N/A  Left PronatorTeres Nml Nml Nml Nml Nml Nml Nml Nml Nml Nml Nml Nml N/A  Left Biceps Nml Nml Nml Nml 1- Rapid Some 1+ Some 1+ Nml Nml N/A  Left Triceps Nml Nml Nml Nml Nml Nml Nml Nml Nml Nml Nml Nml N/A      Waveforms:

## 2017-08-18 ENCOUNTER — Telehealth: Payer: Self-pay | Admitting: *Deleted

## 2017-08-18 NOTE — Telephone Encounter (Signed)
Results and instructions sent via My Chart.  I also requested for patient to call [facility] to set up the MRI since they have been unable to reach him by phone.

## 2017-08-18 NOTE — Telephone Encounter (Signed)
-----   Message from Alda Berthold, DO sent at 08/18/2017 12:20 PM EST ----- Please inform patient that his nerve testing shows nerve impingement at the neck on both sides, as well as mild nerve impingement at the left elbow (ulnar neuropathy), which can be due to leaning on the elbow causing nerve compression or with over flexing at the elbow, such as when sleeping on talking on the phone.  Recommend that he try to avoid over flexion at the elbow and start using a soft elbow pad, if he notices that does lean on it.  Please f/u on MRI cervical spine - I don't see that it is scheduled.  Further recommendations will be based on the results of his MRI. Thanks.

## 2017-08-19 ENCOUNTER — Encounter: Payer: Self-pay | Admitting: Internal Medicine

## 2017-08-19 DIAGNOSIS — G5622 Lesion of ulnar nerve, left upper limb: Secondary | ICD-10-CM | POA: Insufficient documentation

## 2017-08-19 DIAGNOSIS — M5412 Radiculopathy, cervical region: Secondary | ICD-10-CM | POA: Insufficient documentation

## 2017-08-24 ENCOUNTER — Encounter: Payer: Self-pay | Admitting: Internal Medicine

## 2017-08-26 MED ORDER — FLUOXETINE HCL 20 MG PO TABS: ORAL_TABLET | ORAL | 3 refills | Status: DC

## 2017-08-26 NOTE — Addendum Note (Signed)
Addended by: Binnie Rail on: 08/26/2017 04:13 PM   Modules accepted: Orders

## 2017-08-29 ENCOUNTER — Encounter: Payer: Self-pay | Admitting: Neurology

## 2017-08-29 ENCOUNTER — Ambulatory Visit
Admission: RE | Admit: 2017-08-29 | Discharge: 2017-08-29 | Disposition: A | Discharge status: Home / Self Care | Payer: BC Managed Care – PPO | Source: Ambulatory Visit | Attending: Neurology | Admitting: Neurology

## 2017-08-29 DIAGNOSIS — M4302 Spondylolysis, cervical region: Secondary | ICD-10-CM

## 2017-08-30 NOTE — Telephone Encounter (Signed)
I attempted to contact patient via phone today regarding the results of MRI cervical spine and address his MyChart message, however there was no answer so a message was left for the patient to return my call.

## 2017-08-31 ENCOUNTER — Telehealth: Payer: Self-pay | Admitting: Neurology

## 2017-08-31 NOTE — Telephone Encounter (Signed)
Pt was returning a call regarding his MRI and has some questions

## 2017-08-31 NOTE — Telephone Encounter (Signed)
Called patient back and answered questions regarding symptoms and signs of Parkinson's disease as well as the results of his MRI cervical spine.  He has multilevel degenerative changes involving the cervical spine including cervical spondylosis, multilevel canal stenosis, and foraminal stenosis (C3-C4, C5-6).  Recommend starting with conservative therapies with neck physiotherapy. We discussed doing epidural steroid injections, if pain persists.  Surgical evaluation is a last resort.  Patient informed to monitor for signs of worsening paresthesias, weakness, or bowel or bladder incontinence.  Donika K. Posey Pronto, DO

## 2017-09-01 ENCOUNTER — Other Ambulatory Visit: Payer: Self-pay | Admitting: *Deleted

## 2017-09-01 DIAGNOSIS — M5412 Radiculopathy, cervical region: Secondary | ICD-10-CM

## 2017-09-01 DIAGNOSIS — M4302 Spondylolysis, cervical region: Secondary | ICD-10-CM

## 2017-09-01 NOTE — Telephone Encounter (Signed)
Referral sent 

## 2017-09-01 NOTE — Progress Notes (Unsigned)
anm

## 2017-10-08 ENCOUNTER — Ambulatory Visit: Payer: Self-pay | Admitting: Emergency Medicine

## 2017-10-08 VITALS — BP 140/80 | HR 53 | Temp 98.6°F | Resp 18 | Wt 234.2 lb

## 2017-10-08 DIAGNOSIS — J4541 Moderate persistent asthma with (acute) exacerbation: Secondary | ICD-10-CM

## 2017-10-08 DIAGNOSIS — J40 Bronchitis, not specified as acute or chronic: Secondary | ICD-10-CM

## 2017-10-08 MED ORDER — AZITHROMYCIN 250 MG PO TABS: ORAL_TABLET | ORAL | 0 refills | Status: DC

## 2017-10-08 MED ORDER — PREDNISONE 50 MG PO TABS: ORAL_TABLET | ORAL | 0 refills | Status: DC

## 2017-10-08 NOTE — Patient Instructions (Signed)
Acute Bronchitis, Adult Acute bronchitis is when air tubes (bronchi) in the lungs suddenly get swollen. The condition can make it hard to breathe. It can also cause these symptoms:  A cough.  Coughing up clear, yellow, or green mucus.  Wheezing.  Chest congestion.  Shortness of breath.  A fever.  Body aches.  Chills.  A sore throat.  Follow these instructions at home: Medicines  Take over-the-counter and prescription medicines only as told by your doctor.  If you were prescribed an antibiotic medicine, take it as told by your doctor. Do not stop taking the antibiotic even if you start to feel better. General instructions  Rest.  Drink enough fluids to keep your pee (urine) clear or pale yellow.  Avoid smoking and secondhand smoke. If you smoke and you need help quitting, ask your doctor. Quitting will help your lungs heal faster.  Use an inhaler, cool mist vaporizer, or humidifier as told by your doctor.  Keep all follow-up visits as told by your doctor. This is important. How is this prevented? To lower your risk of getting this condition again:  Wash your hands often with soap and water. If you cannot use soap and water, use hand sanitizer.  Avoid contact with people who have cold symptoms.  Try not to touch your hands to your mouth, nose, or eyes.  Make sure to get the flu shot every year.  Contact a doctor if:  Your symptoms do not get better in 2 weeks. Get help right away if:  You cough up blood.  You have chest pain.  You have very bad shortness of breath.  You become dehydrated.  You faint (pass out) or keep feeling like you are going to pass out.  You keep throwing up (vomiting).  You have a very bad headache.  Your fever or chills gets worse. This information is not intended to replace advice given to you by your health care provider. Make sure you discuss any questions you have with your health care provider. Document Released:  03/16/2008 Document Revised: 05/06/2016 Document Reviewed: 03/18/2016 Elsevier Interactive Patient Education  2018 [employer]. Asthma, Adult Asthma is a condition of the lungs in which the airways tighten and narrow. Asthma can make it hard to breathe. Asthma cannot be cured, but medicine and lifestyle changes can help control it. Asthma may be started (triggered) by:  Animal skin flakes (dander).  Dust.  Cockroaches.  Pollen.  Mold.  Smoke.  Cleaning products.  Hair sprays or aerosol sprays.  Paint fumes or strong smells.  Cold air, weather changes, and winds.  Crying or laughing hard.  Stress.  Certain medicines or drugs.  Foods, such as dried fruit, potato chips, and sparkling grape juice.  Infections or conditions (colds, flu).  Exercise.  Certain medical conditions or diseases.  Exercise or tiring activities.  Follow these instructions at home:  Take medicine as told by your doctor.  Use a peak flow meter as told by your doctor. A peak flow meter is a tool that measures how well the lungs are working.  Record and keep track of the peak flow meter's readings.  Understand and use the asthma action plan. An asthma action plan is a written plan for taking care of your asthma and treating your attacks.  To help prevent asthma attacks: ? Do not smoke. Stay away from secondhand smoke. ? Change your heating and air conditioning filter often. ? Limit your use of fireplaces and wood stoves. ? Get rid  of pests (such as roaches and mice) and their droppings. ? Throw away plants if you see mold on them. ? Clean your floors. Dust regularly. Use cleaning products that do not smell. ? Have someone vacuum when you are not home. Use a vacuum cleaner with a HEPA filter if possible. ? Replace carpet with wood, tile, or vinyl flooring. Carpet can trap animal skin flakes and dust. ? Use allergy-proof pillows, mattress covers, and box spring covers. ? Wash bed sheets and  blankets every week in hot water and dry them in a dryer. ? Use blankets that are made of polyester or cotton. ? Clean bathrooms and kitchens with bleach. If possible, have someone repaint the walls in these rooms with mold-resistant paint. Keep out of the rooms that are being cleaned and painted. ? Wash hands often. Contact a doctor if:  You have make a whistling sound when breaking (wheeze), have shortness of breath, or have a cough even if taking medicine to prevent attacks.  The colored mucus you cough up (sputum) is thicker than usual.  The colored mucus you cough up changes from clear or white to yellow, green, gray, or bloody.  You have problems from the medicine you are taking such as: ? A rash. ? Itching. ? Swelling. ? Trouble breathing.  You need reliever medicines more than 2-3 times a week.  Your peak flow measurement is still at 50-79% of your personal best after following the action plan for 1 hour.  You have a fever. Get help right away if:  You seem to be worse and are not responding to medicine during an asthma attack.  You are short of breath even at rest.  You get short of breath when doing very little activity.  You have trouble eating, drinking, or talking.  You have chest pain.  You have a fast heartbeat.  Your lips or fingernails start to turn blue.  You are light-headed, dizzy, or faint.  Your peak flow is less than 50% of your personal best. This information is not intended to replace advice given to you by your health care provider. Make sure you discuss any questions you have with your health care provider. Document Released: 03/16/2008 Document Revised: 03/05/2016 Document Reviewed: 04/27/2013 Elsevier Interactive Patient Education  2017 [employer].

## 2017-10-08 NOTE — Progress Notes (Signed)
.   Frederick Fox is a 51 y.o. male who presents for evaluation of cough, wheezing, and fatigue.  Symptoms include cough described as productive and no fever.  Onset of symptoms was 10 days ago, and has been unchanged since that time.  He does have history of asthma, reports daily use of his inhaler. He is not on maintenance medicines for his asthma, described it as well controlled. He is not taking ACE inhibitors, does have past hx of GERD, takes Zantac describes it as controlled. Treatment to date:  antihistamines, rest and albuterol inhaler .  High risk factors for influenza complications:  none.  The following portions of the patient's history were reviewed and updated as appropriate:  allergies, current medications and past medical history.  Review of Systems  Constitutional: Positive for malaise/fatigue. Negative for chills and fever.  HENT: Negative for congestion, ear discharge, ear pain and sinus pain.   Respiratory: Positive for cough, sputum production and wheezing. Negative for shortness of breath.   Cardiovascular: Negative for chest pain and palpitations.  Gastrointestinal: Negative for abdominal pain, diarrhea, nausea and vomiting.  Musculoskeletal: Negative.   Skin: Negative for itching and rash.    Objective:   Vitals:   10/08/17 0955  BP: 140/80  Pulse: (!) 53  Resp: 18  Temp: 98.6 F (37 C)  SpO2: 97%    Physical Exam  Constitutional: He is well-developed, well-nourished, and in no distress. No distress.  HENT:  Head: Normocephalic and atraumatic.  Right Ear: Tympanic membrane and external ear normal.  Left Ear: Tympanic membrane and external ear normal.  Nose: Nose normal. Right sinus exhibits no maxillary sinus tenderness and no frontal sinus tenderness. Left sinus exhibits no maxillary sinus tenderness and no frontal sinus tenderness.  Mouth/Throat: Uvula is midline and oropharynx is clear and moist. No oropharyngeal exudate.  Eyes: Conjunctivae are normal.   Neck: Normal range of motion. Neck supple.  Cardiovascular: Normal rate and regular rhythm.  Pulmonary/Chest: Effort normal and breath sounds normal. He has no wheezes.  Abdominal: Soft.  Lymphadenopathy:    He has no cervical adenopathy.  Skin: Skin is warm and dry. He is not diaphoretic.  Nursing note and vitals reviewed.    Assessment:   1. Bronchitis   2. Moderate persistent asthma with exacerbation       Plan:   1. Bronchitis -Rest, plenty of fluids, OTC antihistamines - azithromycin (ZITHROMAX) 250 MG tablet; 2 tablets today, then 1 tablet daily till finished  Dispense: 6 tablet; Refill: 0 - predniSONE (DELTASONE) 50 MG tablet; Take 1 tablet daily with food  Dispense: 5 tablet; Refill: 0  2. Moderate persistent asthma with exacerbation - Rest, fluids, antihistamines, follow up with PCP as needed if symptoms persist. - azithromycin (ZITHROMAX) 250 MG tablet; 2 tablets today, then 1 tablet daily till finished  Dispense: 6 tablet; Refill: 0 - predniSONE (DELTASONE) 50 MG tablet; Take 1 tablet daily with food  Dispense: 5 tablet; Refill: 0

## 2017-11-02 ENCOUNTER — Ambulatory Visit: Payer: BC Managed Care – PPO | Admitting: Internal Medicine

## 2017-11-02 ENCOUNTER — Other Ambulatory Visit (INDEPENDENT_AMBULATORY_CARE_PROVIDER_SITE_OTHER): Payer: BC Managed Care – PPO

## 2017-11-02 ENCOUNTER — Encounter: Payer: Self-pay | Admitting: Internal Medicine

## 2017-11-02 VITALS — BP 114/80 | HR 68 | Temp 98.2°F | Resp 16 | Wt 238.0 lb

## 2017-11-02 DIAGNOSIS — N183 Chronic kidney disease, stage 3 unspecified: Secondary | ICD-10-CM

## 2017-11-02 DIAGNOSIS — F329 Major depressive disorder, single episode, unspecified: Secondary | ICD-10-CM

## 2017-11-02 DIAGNOSIS — J45901 Unspecified asthma with (acute) exacerbation: Secondary | ICD-10-CM | POA: Insufficient documentation

## 2017-11-02 DIAGNOSIS — R7303 Prediabetes: Secondary | ICD-10-CM

## 2017-11-02 DIAGNOSIS — I10 Essential (primary) hypertension: Secondary | ICD-10-CM | POA: Diagnosis not present

## 2017-11-02 DIAGNOSIS — J4531 Mild persistent asthma with (acute) exacerbation: Secondary | ICD-10-CM

## 2017-11-02 DIAGNOSIS — M1A9XX0 Chronic gout, unspecified, without tophus (tophi): Secondary | ICD-10-CM | POA: Diagnosis not present

## 2017-11-02 DIAGNOSIS — F32A Depression, unspecified: Secondary | ICD-10-CM

## 2017-11-02 LAB — CBC WITH DIFFERENTIAL/PLATELET
BASOS ABS: 0.1 10*3/uL (ref 0.0–0.1)
Basophils Relative: 1.3 % (ref 0.0–3.0)
EOS ABS: 0.3 10*3/uL (ref 0.0–0.7)
Eosinophils Relative: 3.1 % (ref 0.0–5.0)
HCT: 37.3 % — ABNORMAL LOW (ref 39.0–52.0)
Hemoglobin: 12.4 g/dL — ABNORMAL LOW (ref 13.0–17.0)
LYMPHS ABS: 1.8 10*3/uL (ref 0.7–4.0)
Lymphocytes Relative: 17.1 % (ref 12.0–46.0)
MCHC: 33.1 g/dL (ref 30.0–36.0)
MCV: 77.9 fl — ABNORMAL LOW (ref 78.0–100.0)
MONO ABS: 0.8 10*3/uL (ref 0.1–1.0)
MONOS PCT: 8.2 % (ref 3.0–12.0)
NEUTROS PCT: 70.3 % (ref 43.0–77.0)
Neutro Abs: 7.2 10*3/uL (ref 1.4–7.7)
Platelets: 276 10*3/uL (ref 150.0–400.0)
RBC: 4.79 Mil/uL (ref 4.22–5.81)
RDW: 15.3 % (ref 11.5–15.5)
WBC: 10.2 10*3/uL (ref 4.0–10.5)

## 2017-11-02 MED ORDER — FEBUXOSTAT 40 MG PO TABS: 40.0000 mg | ORAL_TABLET | Freq: Every day | ORAL | 5 refills | Status: DC

## 2017-11-02 MED ORDER — BUDESONIDE-FORMOTEROL FUMARATE 80-4.5 MCG/ACT IN AERO: 2.0000 | INHALATION_SPRAY | Freq: Two times a day (BID) | RESPIRATORY_TRACT | 5 refills | Status: DC

## 2017-11-02 NOTE — Assessment & Plan Note (Signed)
Check a1c Low sugar / carb diet Stressed regular exercise, weight loss  

## 2017-11-02 NOTE — Patient Instructions (Addendum)
  Test(s) ordered today. Your results will be released to [place] (or called to you) after review, usually within 72hours after test completion. If any changes need to be made, you will be notified at that same time.  All other Health Maintenance issues reviewed.   All recommended immunizations and age-appropriate screenings are up-to-date or discussed.  No immunizations administered today.   Medications reviewed and updated.  Changes include a gout prevent medication -Uloric and a maintenance inhaler.   Your prescription(s) have been submitted to your pharmacy. Please take as directed and contact our office if you believe you are having problem(s) with the medication(s).   Please followup in 6 months

## 2017-11-02 NOTE — Assessment & Plan Note (Addendum)
cmp Will refer to nephrology  - to get re-established

## 2017-11-02 NOTE — Assessment & Plan Note (Signed)
Having intermittent gout symptoms in toes on both feet Start uloric - if not too expensive - if so we will start allopurinol Call if he has a gout flare when starting medication

## 2017-11-02 NOTE — Assessment & Plan Note (Signed)
Controlled, stable Continue current dose of medication  

## 2017-11-02 NOTE — Assessment & Plan Note (Signed)
BP well controlled Current regimen effective and well tolerated Continue current medications at current doses cmp  

## 2017-11-02 NOTE — Assessment & Plan Note (Signed)
Having cough, wheeze, tightness No SOB or cold symptoms Asthma exacerbation Start symbicort BID Albuterol prn Call if no improvement

## 2017-11-02 NOTE — Progress Notes (Signed)
Subjective:    Patient ID: Frederick Fox, male    DOB: 07-06-1966, 52 y.o.   MRN: [id number]  HPI The patient is here for follow up.  Hypertension: He is taking his medication daily. He is compliant with a low sodium diet.  He denies chest pain, palpitations, edema, shortness of breath and regular headaches. He is exercising regularly.      Depression: He is taking his medication daily as prescribed. He denies any side effects from the medication. He feels his depression is well controlled and he is happy with his current dose of medication.   CKD:  He is taking all his medication.  He denies SOB and leg edema.   Prediabetes:  He is compliant with a low sugar/carbohydrate diet.  He is exercising regularly.  Asthma: He has a cough and has been using his albuterol inhaler multiple times  a day.    He has tightness and intermittent wheezing.  He denies sob. He has never been on a maintenance inhaler.  He denies cold symptoms.   GERD:  He is taking his medication daily as prescribed.  He denies any GERD symptoms and feels his GERD is well controlled.   Gout: he had some recent gout in both toes.  He did take some aleve. It has resolved. He has had intermittent gout over the past few months.  Medications and allergies reviewed with patient and updated if appropriate.  Patient Active Problem List   Diagnosis Date Noted  . Cervical radiculopathy at C5, chronic 08/19/2017  . Ulnar neuropathy of left upper extremity, mild 08/19/2017  . Bilateral arm weakness 05/18/2017  . Tingling 05/18/2017  . Headache 02/16/2017  . Prediabetes 10/12/2016  . Sciatica of right side 05/26/2016  . Neck pain, chronic 05/13/2016  . Exercise-induced asthma 03/24/2016  . Hiatal hernia 03/24/2016  . OSA (obstructive sleep apnea) 01/21/2016  . Chronic renal insufficiency, stage III (moderate) ([place]) 01/31/2015  . High-renin essential hypertension 08/27/2013  . Depression 08/22/2013  . MICROSCOPIC  HEMATURIA 10/23/2010  . HEMATOSPERMIA 10/23/2010  . GOUT 01/10/2009  . Proteinuria 10/09/2008  . Essential hypertension 03/29/2008  . FREQUENCY, URINARY 04/19/2007    Current Outpatient Medications on File Prior to Visit  Medication Sig Dispense Refill  . acetaminophen (TYLENOL) 500 MG tablet Take 1,000 mg by mouth every 6 (six) hours as needed for mild pain.    [name] albuterol (PROAIR HFA) 108 (90 Base) MCG/ACT inhaler Inhale 1-2 puffs into the lungs every 6 (six) hours as needed. (Patient taking differently: Inhale 2 puffs into the lungs every 6 (six) hours as needed for wheezing or shortness of breath. ) 1 Inhaler 5  . amLODipine (NORVASC) 10 MG tablet Take 1 tablet (10 mg total) by mouth daily. 90 tablet 3  . cyclobenzaprine (FLEXERIL) 10 MG tablet Take 0.5-1 tablet (5-10 mg) by mouth BID PRN for back pain 20 tablet 0  . FLUoxetine (PROZAC) 20 MG tablet Take one tablet PO daily in addition to 40 mg for a total of 60 mg daily 90 tablet 3  . FLUoxetine (PROZAC) 40 MG capsule Take 1 capsule (40 mg total) by mouth daily. 90 capsule 3  . fluticasone (FLONASE) 50 MCG/ACT nasal spray USE TWO SPRAY(S) IN EACH NOSTRIL ONCE DAILY. 16 g 3  . losartan-hydrochlorothiazide (HYZAAR) 100-25 MG tablet TAKE ONE TABLET BY MOUTH ONCE DAILY 90 tablet 1  . nebivolol (BYSTOLIC) 5 MG tablet Take 1 tablet (5 mg total) by mouth daily. 30 tablet 5  .  ranitidine (ZANTAC) 150 MG capsule Take 1 capsule (150 mg total) by mouth 2 (two) times daily. 180 capsule 1   No current facility-administered medications on file prior to visit.     Past Medical History:  Diagnosis Date  . Depression   . Hypertension   . Proteinuria 2005    Past Surgical History:  Procedure Laterality Date  . RENAL BIOPSY  2005   protien in urine, no pathology- nephrologist in [place] History   Socioeconomic History  . Marital status: Married    Spouse name: Not on file  . Number of children: 4  . Years of education: 60    . Highest education level: Not on file  Social Needs  . Financial resource strain: Not on file  . Food insecurity - worry: Not on file  . Food insecurity - inability: Not on file  . Transportation needs - medical: Not on file  . Transportation needs - non-medical: Not on file  Occupational History  . Occupation: Product manager: [employer]  Tobacco Use  . Smoking status: Former Smoker    Types: Cigars  . Smokeless tobacco: Never Used  Substance and Sexual Activity  . Alcohol use: Yes    Comment: 1-2 glasses on the weekend  . Drug use: No  . Sexual activity: Not on file  Other Topics Concern  . Not on file  Social History Narrative   Low salt diet.  Lives with wife and children in a 2 story home.  Has 2 stepchildren and 2 children of his own.     Works as a third [employer].     Education: college.        Family History  Problem Relation Age of Onset  . Hypertension Father   . Parkinson's disease Father   . Hypertension Mother   . Healthy Brother   . Brain cancer Maternal Grandfather   . Heart attack Paternal Grandfather     Review of Systems  Constitutional: Negative for chills and fever.  HENT: Negative for congestion, sinus pain and sore throat.   Respiratory: Positive for cough (dry), chest tightness and wheezing. Negative for shortness of breath.   Cardiovascular: Negative for chest pain, palpitations and leg swelling.  Neurological: Negative for light-headedness and headaches.       Objective:   Vitals:   11/02/17 1610  BP: 114/80  Pulse: 68  Resp: 16  Temp: 98.2 F (36.8 C)  SpO2: 98%   Wt Readings from Last 3 Encounters:  11/02/17 238 lb (108 kg)  10/08/17 234 lb 3.2 oz (106.2 kg)  07/20/17 240 lb 6 oz (109 kg)   Body mass index is 34.15 kg/m.   Physical Exam    Constitutional: Appears well-developed and well-nourished. No distress.  HENT:  Head: Normocephalic and atraumatic.  Neck: Neck supple. No tracheal deviation  present. No thyromegaly present.  No cervical lymphadenopathy Cardiovascular: Normal rate, regular rhythm and normal heart sounds.   No murmur heard. No carotid bruit .  No edema Pulmonary/Chest: Effort normal and breath sounds normal. No respiratory distress. No has no wheezes. No rales.  Skin: Skin is warm and dry. Not diaphoretic.  Psychiatric: Normal mood and affect. Behavior is normal.      Assessment & Plan:    See Problem List for Assessment and Plan of chronic medical problems.

## 2017-11-03 LAB — COMPREHENSIVE METABOLIC PANEL
ALT: 14 U/L (ref 0–53)
AST: 16 U/L (ref 0–37)
Albumin: 4 g/dL (ref 3.5–5.2)
Alkaline Phosphatase: 77 U/L (ref 39–117)
BILIRUBIN TOTAL: 0.3 mg/dL (ref 0.2–1.2)
BUN: 32 mg/dL — AB (ref 6–23)
CO2: 25 mEq/L (ref 19–32)
Calcium: 9.3 mg/dL (ref 8.4–10.5)
Chloride: 103 mEq/L (ref 96–112)
Creatinine, Ser: 2.26 mg/dL — ABNORMAL HIGH (ref 0.40–1.50)
GFR: 32.76 mL/min — ABNORMAL LOW (ref >60.00)
GLUCOSE: 95 mg/dL (ref 70–99)
Potassium: 4.4 mEq/L (ref 3.5–5.1)
SODIUM: 142 meq/L (ref 135–145)
TOTAL PROTEIN: 7.3 g/dL (ref 6.0–8.3)

## 2017-11-03 LAB — LIPID PANEL
CHOLESTEROL: 206 mg/dL — AB (ref 0–200)
HDL: 39.2 mg/dL (ref >39.00)
NonHDL: 166.77
Total CHOL/HDL Ratio: 5
Triglycerides: 378 mg/dL — ABNORMAL HIGH (ref 0.0–149.0)
VLDL: 75.6 mg/dL — AB (ref 0.0–40.0)

## 2017-11-03 LAB — HEMOGLOBIN A1C: HEMOGLOBIN A1C: 6.4 % (ref 4.6–6.5)

## 2017-11-03 LAB — LDL CHOLESTEROL, DIRECT: Direct LDL: 115 mg/dL

## 2017-11-05 ENCOUNTER — Encounter: Payer: Self-pay | Admitting: Internal Medicine

## 2017-11-17 ENCOUNTER — Encounter: Payer: Self-pay | Admitting: Internal Medicine

## 2017-11-17 DIAGNOSIS — E559 Vitamin D deficiency, unspecified: Secondary | ICD-10-CM

## 2017-11-17 DIAGNOSIS — N189 Chronic kidney disease, unspecified: Secondary | ICD-10-CM

## 2017-11-17 MED ORDER — NEBIVOLOL HCL 5 MG PO TABS: 5.0000 mg | ORAL_TABLET | Freq: Every day | ORAL | 5 refills | Status: DC

## 2017-11-17 NOTE — Telephone Encounter (Signed)
Refill sent, please advise on labs. Referral has been sent to [facility]

## 2017-11-19 ENCOUNTER — Ambulatory Visit: Payer: BC Managed Care – PPO | Admitting: Internal Medicine

## 2017-12-06 ENCOUNTER — Other Ambulatory Visit: Payer: Self-pay | Admitting: Internal Medicine

## 2017-12-08 ENCOUNTER — Encounter: Payer: Self-pay | Admitting: Internal Medicine

## 2017-12-08 MED ORDER — PREDNISONE 10 MG PO TABS
ORAL_TABLET | ORAL | 0 refills | Status: DC
Start: 2017-12-08 — End: 2018-02-02

## 2017-12-09 ENCOUNTER — Encounter: Payer: Self-pay | Admitting: Internal Medicine

## 2017-12-09 DIAGNOSIS — R635 Abnormal weight gain: Secondary | ICD-10-CM | POA: Insufficient documentation

## 2017-12-09 DIAGNOSIS — I10 Essential (primary) hypertension: Secondary | ICD-10-CM

## 2017-12-10 ENCOUNTER — Other Ambulatory Visit (INDEPENDENT_AMBULATORY_CARE_PROVIDER_SITE_OTHER): Payer: BC Managed Care – PPO

## 2017-12-10 DIAGNOSIS — R635 Abnormal weight gain: Secondary | ICD-10-CM

## 2017-12-10 DIAGNOSIS — I10 Essential (primary) hypertension: Secondary | ICD-10-CM | POA: Diagnosis not present

## 2017-12-10 DIAGNOSIS — N189 Chronic kidney disease, unspecified: Secondary | ICD-10-CM

## 2017-12-10 LAB — TSH: TSH: 0.6 u[IU]/mL (ref 0.35–4.50)

## 2017-12-10 LAB — VITAMIN D 25 HYDROXY (VIT D DEFICIENCY, FRACTURES): VITD: 21.6 ng/mL — AB (ref 30.00–100.00)

## 2017-12-10 LAB — T4, FREE: FREE T4: 0.64 ng/dL (ref 0.60–1.60)

## 2017-12-11 ENCOUNTER — Encounter: Payer: Self-pay | Admitting: Internal Medicine

## 2018-01-20 ENCOUNTER — Encounter: Payer: Self-pay | Admitting: Internal Medicine

## 2018-01-28 ENCOUNTER — Encounter: Payer: Self-pay | Admitting: Internal Medicine

## 2018-02-01 ENCOUNTER — Other Ambulatory Visit: Payer: Self-pay | Admitting: Internal Medicine

## 2018-02-01 MED ORDER — FEBUXOSTAT 80 MG PO TABS: 80.0000 mg | ORAL_TABLET | Freq: Every day | ORAL | 5 refills | Status: DC

## 2018-02-01 NOTE — Telephone Encounter (Signed)
Copied from [place] [id number]. Topic: Quick Communication - Rx Refill/Question >> Feb 01, 2018  9:28 AM Arletha Grippe wrote: Medication: predniSONE (DELTASONE) 10 MG tablet Has the patient contacted their pharmacy? No. Says he couldn't  (Agent: If no, request that the patient contact the pharmacy for the refill.) Preferred Pharmacy (with phone number or street name): [email or web address]  Agent: Please be advised that RX refills may take up to 3 business days. We ask that you follow-up with your pharmacy.

## 2018-02-02 MED ORDER — PREDNISONE 10 MG PO TABS: ORAL_TABLET | ORAL | 0 refills | Status: DC

## 2018-02-02 NOTE — Telephone Encounter (Signed)
Deltasone 10 mg refill request  LOV 11/02/17 with Dr. Laural Roes [address], [place] - [address]

## 2018-02-02 NOTE — Telephone Encounter (Signed)
rx sent - he is having an acute gout attack- this is treatment for that

## 2018-02-02 NOTE — Telephone Encounter (Signed)
This was sent on 12/08/17 for back pain. Please advise if he needs to be seen now.

## 2018-03-28 ENCOUNTER — Other Ambulatory Visit: Payer: Self-pay | Admitting: Internal Medicine

## 2018-04-21 NOTE — Progress Notes (Signed)
Subjective:    Patient ID: Frederick Fox, male    DOB: [birth year], 52 y.o.   MRN: [id number]  HPI The patient is here for follow up.  He is working on weight loss.  He has seen a nutritionist and a dietitian.  He has discovered that he has difficulty eating in moderation and typically does want extreme or another, both of which create weight gain.  He is working on that.  Hypertension: He is taking his medication daily. He is compliant with a low sodium diet.  He denies chest pain, palpitations, edema, shortness of breath and regular headaches. He is exercising regularly.     Prediabetes:  He is compliant with a low sugar/carbohydrate diet.  He is exercising regularly.  CKD, vitamin D def:   He is following with nephrology.  He is taking his vitamin D daily.  He does not take any NSAIDs.  Depression: He is taking his medication daily as prescribed. He denies any side effects from the medication. He feels his depression is well controlled and he is happy with his current dose of medication.   GERD:  He is taking his medication daily as prescribed.  He denies any GERD symptoms and feels his GERD is well controlled.   Gout:  He is having gout pain in one foot for a couple of day and he icing it and it goes away.  It bounces back and forth between his feet.  He is taking the Uloric daily and feels that has been controlling his gout much better.  He is not drinking much alcohol-rarely drinks anything.  Medications and allergies reviewed with patient and updated if appropriate.  Patient Active Problem List   Diagnosis Date Noted  . Weight gain 12/09/2017  . Asthma exacerbation 11/02/2017  . Cervical radiculopathy at C5, chronic 08/19/2017  . Ulnar neuropathy of left upper extremity, mild 08/19/2017  . Bilateral arm weakness 05/18/2017  . Tingling 05/18/2017  . Headache 02/16/2017  . Prediabetes 10/12/2016  . Sciatica of right side 05/26/2016  . Neck pain, chronic 05/13/2016  .  Exercise-induced asthma 03/24/2016  . Hiatal hernia 03/24/2016  . OSA (obstructive sleep apnea) 01/21/2016  . Chronic renal insufficiency, stage III (moderate) ([place]) 01/31/2015  . High-renin essential hypertension 08/27/2013  . Depression 08/22/2013  . MICROSCOPIC HEMATURIA 10/23/2010  . HEMATOSPERMIA 10/23/2010  . GOUT 01/10/2009  . Proteinuria 10/09/2008  . Essential hypertension 03/29/2008  . FREQUENCY, URINARY 04/19/2007    Current Outpatient Medications on File Prior to Visit  Medication Sig Dispense Refill  . acetaminophen (TYLENOL) 500 MG tablet Take 1,000 mg by mouth every 6 (six) hours as needed for mild pain.    [name] albuterol (PROAIR HFA) 108 (90 Base) MCG/ACT inhaler Inhale 1-2 puffs into the lungs every 6 (six) hours as needed. (Patient taking differently: Inhale 2 puffs into the lungs every 6 (six) hours as needed for wheezing or shortness of breath. ) 1 Inhaler 5  . amLODipine (NORVASC) 10 MG tablet TAKE ONE TABLET BY MOUTH DAILY 90 tablet 3  . budesonide-formoterol (SYMBICORT) 80-4.5 MCG/ACT inhaler Inhale 2 puffs into the lungs 2 (two) times daily. 1 Inhaler 5  . cyclobenzaprine (FLEXERIL) 10 MG tablet Take 0.5-1 tablet (5-10 mg) by mouth BID PRN for back pain 20 tablet 0  . Febuxostat 80 MG TABS Take 1 tablet (80 mg total) by mouth daily. 30 tablet 5  . FLUoxetine (PROZAC) 20 MG tablet Take one tablet PO daily in addition to 40  mg for a total of 60 mg daily 90 tablet 3  . FLUoxetine (PROZAC) 40 MG capsule Take 1 capsule (40 mg total) by mouth daily. 90 capsule 3  . fluticasone (FLONASE) 50 MCG/ACT nasal spray USE TWO SPRAY(S) IN EACH NOSTRIL ONCE DAILY. 16 g 3  . losartan-hydrochlorothiazide (HYZAAR) 100-25 MG tablet TAKE 1 TABLET BY MOUTH ONCE DAILY 90 tablet 1  . nebivolol (BYSTOLIC) 5 MG tablet Take 1 tablet (5 mg total) by mouth daily. 30 tablet 5  . ranitidine (ZANTAC) 150 MG capsule Take 1 capsule (150 mg total) by mouth 2 (two) times daily. 180 capsule 1   No  current facility-administered medications on file prior to visit.     Past Medical History:  Diagnosis Date  . Depression   . Hypertension   . Proteinuria 2005    Past Surgical History:  Procedure Laterality Date  . RENAL BIOPSY  2005   protien in urine, no pathology- nephrologist in [place] History   Socioeconomic History  . Marital status: Married    Spouse name: Not on file  . Number of children: 4  . Years of education: 59  . Highest education level: Not on file  Occupational History  . Occupation: Product manager: [employer]  . Financial resource strain: Not on file  . Food insecurity:    Worry: Not on file    Inability: Not on file  . Transportation needs:    Medical: Not on file    Non-medical: Not on file  Tobacco Use  . Smoking status: Former Smoker    Types: Cigars  . Smokeless tobacco: Never Used  Substance and Sexual Activity  . Alcohol use: Yes    Comment: 1-2 glasses on the weekend  . Drug use: No  . Sexual activity: Not on file  Lifestyle  . Physical activity:    Days per week: Not on file    Minutes per session: Not on file  . Stress: Not on file  Relationships  . Social connections:    Talks on phone: Not on file    Gets together: Not on file    Attends religious service: Not on file    Active member of club or organization: Not on file    Attends meetings of clubs or organizations: Not on file    Relationship status: Not on file  Other Topics Concern  . Not on file  Social History Narrative   Low salt diet.  Lives with wife and children in a 2 story home.  Has 2 stepchildren and 2 children of his own.     Works as a [employer].     Education: college.        Family History  Problem Relation Age of Onset  . Hypertension Father   . Parkinson's disease Father   . Hypertension Mother   . Healthy Brother   . Brain cancer Maternal Grandfather   . Heart attack Paternal Grandfather      Review of Systems  Constitutional: Negative for chills and fever.  Respiratory: Negative for cough, shortness of breath and wheezing.   Cardiovascular: Negative for chest pain, palpitations and leg swelling.  Musculoskeletal: Positive for arthralgias (gout pain intermittently - mild).  Neurological: Negative for light-headedness and headaches.       Objective:   Vitals:   04/22/18 1421  BP: 124/78  Pulse: (!) 57  Resp: 16  Temp: 98.7  F (37.1 C)  SpO2: 98%   BP Readings from Last 3 Encounters:  04/22/18 124/78  11/02/17 114/80  10/08/17 140/80   Wt Readings from Last 3 Encounters:  04/22/18 249 lb (112.9 kg)  11/02/17 238 lb (108 kg)  10/08/17 234 lb 3.2 oz (106.2 kg)   Body mass index is 35.73 kg/m.   Physical Exam    Constitutional: Appears well-developed and well-nourished. No distress.  HENT:  Head: Normocephalic and atraumatic.  Neck: Neck supple. No tracheal deviation present. No thyromegaly present.  No cervical lymphadenopathy Cardiovascular: Normal rate, regular rhythm and normal heart sounds.   No murmur heard. No carotid bruit .  No edema Pulmonary/Chest: Effort normal and breath sounds normal. No respiratory distress. No has no wheezes. No rales.  Skin: Skin is warm and dry. Not diaphoretic.  Psychiatric: Normal mood and affect. Behavior is normal.      Assessment & Plan:    See Problem List for Assessment and Plan of chronic medical problems.

## 2018-04-22 ENCOUNTER — Encounter: Payer: Self-pay | Admitting: Internal Medicine

## 2018-04-22 ENCOUNTER — Ambulatory Visit: Payer: BC Managed Care – PPO | Admitting: Internal Medicine

## 2018-04-22 ENCOUNTER — Other Ambulatory Visit (INDEPENDENT_AMBULATORY_CARE_PROVIDER_SITE_OTHER): Payer: BC Managed Care – PPO

## 2018-04-22 VITALS — BP 124/78 | HR 57 | Temp 98.7°F | Resp 16 | Ht 70.0 in | Wt 249.0 lb

## 2018-04-22 DIAGNOSIS — F329 Major depressive disorder, single episode, unspecified: Secondary | ICD-10-CM

## 2018-04-22 DIAGNOSIS — R7303 Prediabetes: Secondary | ICD-10-CM

## 2018-04-22 DIAGNOSIS — I10 Essential (primary) hypertension: Secondary | ICD-10-CM

## 2018-04-22 DIAGNOSIS — K219 Gastro-esophageal reflux disease without esophagitis: Secondary | ICD-10-CM | POA: Diagnosis not present

## 2018-04-22 DIAGNOSIS — M1A9XX0 Chronic gout, unspecified, without tophus (tophi): Secondary | ICD-10-CM

## 2018-04-22 DIAGNOSIS — N183 Chronic kidney disease, stage 3 unspecified: Secondary | ICD-10-CM

## 2018-04-22 DIAGNOSIS — F32A Depression, unspecified: Secondary | ICD-10-CM

## 2018-04-22 DIAGNOSIS — Z1211 Encounter for screening for malignant neoplasm of colon: Secondary | ICD-10-CM | POA: Diagnosis not present

## 2018-04-22 DIAGNOSIS — N2889 Other specified disorders of kidney and ureter: Secondary | ICD-10-CM

## 2018-04-22 LAB — COMPREHENSIVE METABOLIC PANEL
ALT: 31 U/L (ref 0–53)
AST: 22 U/L (ref 0–37)
Albumin: 4.1 g/dL (ref 3.5–5.2)
Alkaline Phosphatase: 69 U/L (ref 39–117)
BILIRUBIN TOTAL: 0.2 mg/dL (ref 0.2–1.2)
BUN: 43 mg/dL — ABNORMAL HIGH (ref 6–23)
CHLORIDE: 102 meq/L (ref 96–112)
CO2: 27 meq/L (ref 19–32)
Calcium: 9 mg/dL (ref 8.4–10.5)
Creatinine, Ser: 2.23 mg/dL — ABNORMAL HIGH (ref 0.40–1.50)
GFR: 33.21 mL/min — AB (ref >60.00)
Glucose, Bld: 150 mg/dL — ABNORMAL HIGH (ref 70–99)
Potassium: 3.7 mEq/L (ref 3.5–5.1)
Sodium: 138 mEq/L (ref 135–145)
Total Protein: 6.9 g/dL (ref 6.0–8.3)

## 2018-04-22 LAB — CBC WITH DIFFERENTIAL/PLATELET
BASOS PCT: 1.7 % (ref 0.0–3.0)
Basophils Absolute: 0.2 10*3/uL — ABNORMAL HIGH (ref 0.0–0.1)
EOS PCT: 3.1 % (ref 0.0–5.0)
Eosinophils Absolute: 0.3 10*3/uL (ref 0.0–0.7)
HEMATOCRIT: 39 % (ref 39.0–52.0)
Hemoglobin: 12.8 g/dL — ABNORMAL LOW (ref 13.0–17.0)
LYMPHS PCT: 20.5 % (ref 12.0–46.0)
Lymphs Abs: 2 10*3/uL (ref 0.7–4.0)
MCHC: 32.7 g/dL (ref 30.0–36.0)
MCV: 76.9 fl — AB (ref 78.0–100.0)
Monocytes Absolute: 0.8 10*3/uL (ref 0.1–1.0)
Monocytes Relative: 8.3 % (ref 3.0–12.0)
NEUTROS ABS: 6.5 10*3/uL (ref 1.4–7.7)
Neutrophils Relative %: 66.4 % (ref 43.0–77.0)
PLATELETS: 264 10*3/uL (ref 150.0–400.0)
RBC: 5.08 Mil/uL (ref 4.22–5.81)
RDW: 15.9 % — ABNORMAL HIGH (ref 11.5–15.5)
WBC: 9.8 10*3/uL (ref 4.0–10.5)

## 2018-04-22 LAB — LIPID PANEL
CHOLESTEROL: 213 mg/dL — AB (ref 0–200)
HDL: 39.3 mg/dL (ref >39.00)
NonHDL: 173.61
Total CHOL/HDL Ratio: 5
VLDL: 80 mg/dL — ABNORMAL HIGH (ref 0.0–40.0)

## 2018-04-22 LAB — HEMOGLOBIN A1C: Hgb A1c MFr Bld: 6.4 % (ref 4.6–6.5)

## 2018-04-22 LAB — VITAMIN D 25 HYDROXY (VIT D DEFICIENCY, FRACTURES): VITD: 12.35 ng/mL — ABNORMAL LOW (ref 30.00–100.00)

## 2018-04-22 LAB — URIC ACID: Uric Acid, Serum: 6.3 mg/dL (ref 4.0–7.8)

## 2018-04-22 LAB — LDL CHOLESTEROL, DIRECT: Direct LDL: 122 mg/dL

## 2018-04-22 MED ORDER — FLUOXETINE HCL 20 MG PO TABS: ORAL_TABLET | ORAL | 3 refills | Status: DC

## 2018-04-22 MED ORDER — FLUOXETINE HCL 40 MG PO CAPS: 40.0000 mg | ORAL_CAPSULE | Freq: Every day | ORAL | 3 refills | Status: DC

## 2018-04-22 NOTE — Assessment & Plan Note (Signed)
GERD is controlled with twice daily ranitidine Continue

## 2018-04-22 NOTE — Assessment & Plan Note (Signed)
Blood pressure well controlled Continue current medication at current doses CMP

## 2018-04-22 NOTE — Assessment & Plan Note (Signed)
Has twinges of pain that last a couple days in each big toe-1 4 or the other, intermittent, not frequent Gout fairly controlled with Uloric Check uric acid level Can consider colchicine, but would like to avoid more medication

## 2018-04-22 NOTE — Patient Instructions (Addendum)

## 2018-04-22 NOTE — Assessment & Plan Note (Signed)
Check a1c Low sugar / carb diet Stressed regular exercise, weight loss  

## 2018-04-22 NOTE — Assessment & Plan Note (Signed)
Following with nephrology CMP, CBC, vitamin D

## 2018-04-22 NOTE — Assessment & Plan Note (Signed)
Controlled, stable Continue current dose of medication  

## 2018-04-25 ENCOUNTER — Other Ambulatory Visit: Payer: Self-pay | Admitting: Internal Medicine

## 2018-04-25 MED ORDER — VITAMIN D (ERGOCALCIFEROL) 1.25 MG (50000 UNIT) PO CAPS: 50000.0000 [IU] | ORAL_CAPSULE | ORAL | 0 refills | Status: DC

## 2018-04-25 MED ORDER — ROSUVASTATIN CALCIUM 10 MG PO TABS: 10.0000 mg | ORAL_TABLET | Freq: Every day | ORAL | 3 refills | Status: DC

## 2018-05-02 ENCOUNTER — Ambulatory Visit: Payer: BC Managed Care – PPO | Admitting: Internal Medicine

## 2018-05-09 ENCOUNTER — Encounter: Payer: Self-pay | Admitting: Gastroenterology

## 2018-05-12 HISTORY — PX: COLONOSCOPY: SHX174

## 2018-05-17 ENCOUNTER — Ambulatory Visit (AMBULATORY_SURGERY_CENTER): Payer: Self-pay

## 2018-05-17 VITALS — Ht 70.0 in | Wt 243.6 lb

## 2018-05-17 DIAGNOSIS — Z1211 Encounter for screening for malignant neoplasm of colon: Secondary | ICD-10-CM

## 2018-05-17 MED ORDER — NA SULFATE-K SULFATE-MG SULF 17.5-3.13-1.6 GM/177ML PO SOLN: 1.0000 | Freq: Once | ORAL | 0 refills | Status: AC

## 2018-05-17 NOTE — Progress Notes (Signed)
Per pt, no allergies to soy or egg products.Pt not taking any weight loss meds or using  O2 at home.  Pt refused emmi video. 

## 2018-05-20 ENCOUNTER — Encounter: Payer: Self-pay | Admitting: Gastroenterology

## 2018-05-22 NOTE — Progress Notes (Signed)
Subjective:    Patient ID: Frederick Fox, male    DOB: [birth year], 52 y.o.   MRN: [id number]  HPI The patient is here for an acute visit.  Increased fluid in left leg: He has been working out regularly at the gym-typically does elliptical and does like presses.  Last week he thinks he injured the knee while working out.  He had knee pain and mild swelling, but that went away.  With the knee injury came left lower leg swelling after the knee pain and swelling resolved he still had left lower leg swelling.  He was concerned about his kidneys or his heart.  In the past couple of days the swelling has resolved and he feels his leg is now normal.  He denies any pain in the left calf.  He denies any current swelling.  He denies any skin changes or redness in the left lower leg.  He denies shortness of breath, chest pain or palpitations.  Gout: He is taking the Uloric daily as prescribed.  He denies any recent gout attacks.    Medications and allergies reviewed with patient and updated if appropriate.  Patient Active Problem List   Diagnosis Date Noted  . GERD (gastroesophageal reflux disease) 04/22/2018  . Weight gain 12/09/2017  . Cervical radiculopathy at C5, chronic 08/19/2017  . Ulnar neuropathy of left upper extremity, mild 08/19/2017  . Bilateral arm weakness 05/18/2017  . Tingling 05/18/2017  . Headache 02/16/2017  . Prediabetes 10/12/2016  . Neck pain, chronic 05/13/2016  . Exercise-induced asthma 03/24/2016  . Hiatal hernia 03/24/2016  . OSA (obstructive sleep apnea) 01/21/2016  . Chronic renal insufficiency, stage III (moderate) ([place]) 01/31/2015  . High-renin essential hypertension 08/27/2013  . Depression 08/22/2013  . MICROSCOPIC HEMATURIA 10/23/2010  . HEMATOSPERMIA 10/23/2010  . GOUT 01/10/2009  . Proteinuria 10/09/2008  . Essential hypertension 03/29/2008  . FREQUENCY, URINARY 04/19/2007    Current Outpatient Medications on File Prior to Visit  Medication Sig  Dispense Refill  . acetaminophen (TYLENOL) 500 MG tablet Take 1,000 mg by mouth every 6 (six) hours as needed for mild pain.    [name] albuterol (PROAIR HFA) 108 (90 Base) MCG/ACT inhaler Inhale 1-2 puffs into the lungs every 6 (six) hours as needed. (Patient taking differently: Inhale 2 puffs into the lungs every 6 (six) hours as needed for wheezing or shortness of breath. ) 1 Inhaler 5  . amLODipine (NORVASC) 10 MG tablet TAKE ONE TABLET BY MOUTH DAILY 90 tablet 3  . budesonide-formoterol (SYMBICORT) 80-4.5 MCG/ACT inhaler Inhale 2 puffs into the lungs 2 (two) times daily. (Patient taking differently: Inhale 2 puffs into the lungs as needed. ) 1 Inhaler 5  . cyclobenzaprine (FLEXERIL) 10 MG tablet Take 0.5-1 tablet (5-10 mg) by mouth BID PRN for back pain 20 tablet 0  . Febuxostat 80 MG TABS Take 1 tablet (80 mg total) by mouth daily. 30 tablet 5  . FLUoxetine (PROZAC) 20 MG tablet Take one tablet PO daily in addition to 40 mg for a total of 60 mg daily 90 tablet 3  . FLUoxetine (PROZAC) 40 MG capsule Take 1 capsule (40 mg total) by mouth daily. 90 capsule 3  . fluticasone (FLONASE) 50 MCG/ACT nasal spray USE TWO SPRAY(S) IN EACH NOSTRIL ONCE DAILY. 16 g 3  . losartan-hydrochlorothiazide (HYZAAR) 100-25 MG tablet TAKE 1 TABLET BY MOUTH ONCE DAILY 90 tablet 1  . Multiple Vitamin (MULTIVITAMIN) tablet Take 1 tablet by mouth daily.    [name]  nebivolol (BYSTOLIC) 5 MG tablet Take 1 tablet (5 mg total) by mouth daily. 30 tablet 5  . ranitidine (ZANTAC) 150 MG capsule Take 1 capsule (150 mg total) by mouth 2 (two) times daily. 180 capsule 1  . rosuvastatin (CRESTOR) 10 MG tablet Take 1 tablet (10 mg total) by mouth daily. 90 tablet 3  . Turmeric 500 MG CAPS Take by mouth daily.    . Vitamin D, Ergocalciferol, (DRISDOL) 50000 units CAPS capsule Take 1 capsule (50,000 Units total) by mouth every 7 (seven) days. 12 capsule 0   No current facility-administered medications on file prior to visit.     Past Medical  History:  Diagnosis Date  . Asthma   . Depression   . GERD (gastroesophageal reflux disease)   . Gout    hx of gout  . H/O hiatal hernia   . Hyperlipidemia   . Hypertension   . Proteinuria 2005  . Sleep apnea    uses c-pap    Past Surgical History:  Procedure Laterality Date  . RENAL BIOPSY  2005   protien in urine, no pathology- nephrologist in [facility]  . WISDOM TOOTH EXTRACTION      Social History   Socioeconomic History  . Marital status: Married    Spouse name: Not on file  . Number of children: 4  . Years of education: 7  . Highest education level: Not on file  Occupational History  . Occupation: Product manager: [employer]  . Financial resource strain: Not on file  . Food insecurity:    Worry: Not on file    Inability: Not on file  . Transportation needs:    Medical: Not on file    Non-medical: Not on file  Tobacco Use  . Smoking status: Former Smoker    Types: Cigars  . Smokeless tobacco: Never Used  . Tobacco comment: occasional ciagar/ last one 2017  Substance and Sexual Activity  . Alcohol use: Yes    Alcohol/week: 2.0 standard drinks    Types: 2 Glasses of wine per week    Comment: 1-2 glasses on the weekend  . Drug use: No  . Sexual activity: Not on file  Lifestyle  . Physical activity:    Days per week: Not on file    Minutes per session: Not on file  . Stress: Not on file  Relationships  . Social connections:    Talks on phone: Not on file    Gets together: Not on file    Attends religious service: Not on file    Active member of club or organization: Not on file    Attends meetings of clubs or organizations: Not on file    Relationship status: Not on file  Other Topics Concern  . Not on file  Social History Narrative   Low salt diet.  Lives with wife and children in a 2 story home.  Has 2 stepchildren and 2 children of his own.     Works as a [employer].     Education: college.         Family History  Problem Relation Age of Onset  . Hypertension Father   . Parkinson's disease Father   . Hypertension Mother   . Healthy Brother   . Brain cancer Maternal Grandfather   . Heart attack Paternal Grandfather     Review of Systems  Constitutional: Negative for fever.  Respiratory: Negative for shortness of breath.  Cardiovascular: Positive for leg swelling (left leg only). Negative for chest pain and palpitations.  Skin: Negative for color change.  Neurological: Negative for light-headedness and headaches.       Objective:   Vitals:   05/23/18 0951  BP: 110/70  Pulse: 86  Resp: 16  Temp: 98.6 F (37 C)  SpO2: 97%   BP Readings from Last 3 Encounters:  05/23/18 110/70  04/22/18 124/78  11/02/17 114/80   Wt Readings from Last 3 Encounters:  05/23/18 245 lb (111.1 kg)  05/17/18 243 lb 9.6 oz (110.5 kg)  04/22/18 249 lb (112.9 kg)   Body mass index is 35.15 kg/m.   Physical Exam    Constitutional: Appears well-developed and well-nourished. No distress.  HENT:  Head: Normocephalic and atraumatic.  Cardiovascular: Normal rate, regular rhythm and normal heart sounds.   No murmur heard. No carotid bruit .  No edema Pulmonary/Chest: Effort normal and breath sounds normal. No respiratory distress. No has no wheezes. No rales.  Skin: Skin is warm and dry. Not diaphoretic.  Psychiatric: Normal mood and affect. Behavior is normal.       Assessment & Plan:    See Problem List for Assessment and Plan of chronic medical problems.

## 2018-05-23 ENCOUNTER — Encounter

## 2018-05-23 ENCOUNTER — Encounter: Payer: Self-pay | Admitting: Internal Medicine

## 2018-05-23 ENCOUNTER — Ambulatory Visit: Payer: BC Managed Care – PPO | Admitting: Internal Medicine

## 2018-05-23 VITALS — BP 110/70 | HR 86 | Temp 98.6°F | Resp 16 | Wt 245.0 lb

## 2018-05-23 DIAGNOSIS — Z8739 Personal history of other diseases of the musculoskeletal system and connective tissue: Secondary | ICD-10-CM

## 2018-05-23 DIAGNOSIS — M1A9XX0 Chronic gout, unspecified, without tophus (tophi): Secondary | ICD-10-CM

## 2018-05-23 DIAGNOSIS — M7989 Other specified soft tissue disorders: Secondary | ICD-10-CM

## 2018-05-23 MED ORDER — ALLOPURINOL 100 MG PO TABS: 100.0000 mg | ORAL_TABLET | Freq: Every day | ORAL | 6 refills | Status: DC

## 2018-05-23 NOTE — Patient Instructions (Signed)
  Medications reviewed and updated.  Changes include stopping Uloric and starting allopurinol.   Your prescription(s) have been submitted to your pharmacy. Please take as directed and contact our office if you believe you are having problem(s) with the medication(s).

## 2018-05-23 NOTE — Assessment & Plan Note (Signed)
He was experiencing left lower leg swelling after a left knee injury.  Swelling has resolved at this time No systemic swelling, right leg swelling or concerning systemic symptoms Reassured him this was not cardiac or kidney related Related to knee injury Exam today is normal Since the swelling has resolved no further evaluation or work-up is needed

## 2018-05-23 NOTE — Assessment & Plan Note (Signed)
Gout controlled with Uloric 80 mg daily-has not had any gout attacks and tolerates medication without side effects Given recent study showing increased risk of sudden cardiac death, which I discussed with him we will discontinue and start allopurinol at 100 mg daily Most likely would not be able to increase dose secondary to decreased kidney function He will let me know if he experiences any gout symptoms

## 2018-05-31 ENCOUNTER — Encounter: Payer: Self-pay | Admitting: Gastroenterology

## 2018-05-31 ENCOUNTER — Ambulatory Visit (AMBULATORY_SURGERY_CENTER): Payer: BC Managed Care – PPO | Admitting: Gastroenterology

## 2018-05-31 VITALS — BP 112/74 | HR 49 | Temp 98.6°F | Resp 16 | Ht 70.0 in | Wt 243.0 lb

## 2018-05-31 DIAGNOSIS — Z1211 Encounter for screening for malignant neoplasm of colon: Secondary | ICD-10-CM

## 2018-05-31 DIAGNOSIS — D120 Benign neoplasm of cecum: Secondary | ICD-10-CM | POA: Diagnosis not present

## 2018-05-31 DIAGNOSIS — D124 Benign neoplasm of descending colon: Secondary | ICD-10-CM

## 2018-05-31 DIAGNOSIS — D123 Benign neoplasm of transverse colon: Secondary | ICD-10-CM | POA: Diagnosis not present

## 2018-05-31 MED ORDER — SODIUM CHLORIDE 0.9 % IV SOLN: 500.0000 mL | Freq: Once | INTRAVENOUS | Status: DC

## 2018-05-31 NOTE — Op Note (Signed)
[facility] Patient Name: Frederick Fox Procedure Date: 05/31/2018 4:20 PM MRN: [id number] Endoscopist: Mauri Pole , MD Age: 52 Referring MD:  Date of Birth: 08/12/1966 Gender: Male Account #: [id number] Procedure:                Colonoscopy Indications:              Screening for colorectal malignant neoplasm Medicines:                Monitored Anesthesia Care Procedure:                Pre-Anesthesia Assessment:                           - Prior to the procedure, a History and Physical                            was performed, and patient medications and                            allergies were reviewed. The patient's tolerance of                            previous anesthesia was also reviewed. The risks                            and benefits of the procedure and the sedation                            options and risks were discussed with the patient.                            All questions were answered, and informed consent                            was obtained. Prior Anticoagulants: The patient has                            taken no previous anticoagulant or antiplatelet                            agents. ASA Grade Assessment: II - A patient with                            mild systemic disease. After reviewing the risks                            and benefits, the patient was deemed in                            satisfactory condition to undergo the procedure.                           After obtaining informed consent, the colonoscope  was passed under direct vision. Throughout the                            procedure, the patient's blood pressure, pulse, and                            oxygen saturations were monitored continuously. The                            Model PCF-[id number]) scope was introduced                            through the anus and advanced to the the cecum,                            identified  by appendiceal orifice and ileocecal                            valve. The colonoscopy was performed without                            difficulty. The patient tolerated the procedure                            well. The quality of the bowel preparation was                            excellent. The ileocecal valve, appendiceal                            orifice, and rectum were photographed. Scope In: 4:22:26 PM Scope Out: 4:37:52 PM Scope Withdrawal Time: 0 hours 12 minutes 21 seconds  Total Procedure Duration: 0 hours 15 minutes 26 seconds  Findings:                 The perianal and digital rectal examinations were                            normal.                           A 1 mm polyp was found in the appendiceal orifice.                            The polyp was sessile. The polyp was removed with a                            cold biopsy forceps. Resection and retrieval were                            complete.                           Three sessile polyps were found in the descending  colon and transverse colon. The polyps were 4 to 6                            mm in size. These polyps were removed with a cold                            snare. Resection and retrieval were complete.                           A few small and large-mouthed diverticula were                            found in the sigmoid colon and ascending colon.                           Non-bleeding internal hemorrhoids were found during                            retroflexion. The hemorrhoids were small. Complications:            No immediate complications. Estimated Blood Loss:     Estimated blood loss was minimal. Impression:               - One 1 mm polyp at the appendiceal orifice,                            removed with a cold biopsy forceps. Resected and                            retrieved.                           - Three 4 to 6 mm polyps in the descending colon                             and in the transverse colon, removed with a cold                            snare. Resected and retrieved.                           - Diverticulosis in the sigmoid colon and in the                            ascending colon.                           - Non-bleeding internal hemorrhoids. Recommendation:           - Patient has a contact number available for                            emergencies. The signs and symptoms of potential  delayed complications were discussed with the                            patient. Return to normal activities tomorrow.                            Written discharge instructions were provided to the                            patient.                           - Resume previous diet.                           - Continue present medications.                           - Await pathology results.                           - Repeat colonoscopy in 3 - 5 years for                            surveillance based on pathology results. Mauri Pole, MD 05/31/2018 4:42:50 PM This report has been signed electronically.

## 2018-05-31 NOTE — Progress Notes (Signed)
Called to room to assist during endoscopic procedure.  Patient ID and intended procedure confirmed with present staff. Received instructions for my participation in the procedure from the performing physician.  

## 2018-05-31 NOTE — Patient Instructions (Signed)
YOU HAD AN ENDOSCOPIC PROCEDURE TODAY AT [facility]:   Refer to the procedure report that was given to you for any specific questions about what was found during the examination.  If the procedure report does not answer your questions, please call your gastroenterologist to clarify.  If you requested that your care partner not be given the details of your procedure findings, then the procedure report has been included in a sealed envelope for you to review at your convenience later.  YOU SHOULD EXPECT: Some feelings of bloating in the abdomen. Passage of more gas than usual.  Walking can help get rid of the air that was put into your GI tract during the procedure and reduce the bloating. If you had a lower endoscopy (such as a colonoscopy or flexible sigmoidoscopy) you may notice spotting of blood in your stool or on the toilet paper. If you underwent a bowel prep for your procedure, you may not have a normal bowel movement for a few days.  Please Note:  You might notice some irritation and congestion in your nose or some drainage.  This is from the oxygen used during your procedure.  There is no need for concern and it should clear up in a day or so.  SYMPTOMS TO REPORT IMMEDIATELY:   Following lower endoscopy (colonoscopy or flexible sigmoidoscopy):  Excessive amounts of blood in the stool  Significant tenderness or worsening of abdominal pains  Swelling of the abdomen that is new, acute  Fever of 100F or higher  For urgent or emergent issues, a gastroenterologist can be reached at any hour by calling [phone].   DIET:  We do recommend a small meal at first, but then you may proceed to your regular diet.  Drink plenty of fluids but you should avoid alcoholic beverages for 24 hours.  ACTIVITY:  You should plan to take it easy for the rest of today and you should NOT DRIVE or use heavy machinery until tomorrow (because of the sedation medicines used during the test).     FOLLOW UP: Our staff will call the number listed on your records the next business day following your procedure to check on you and address any questions or concerns that you may have regarding the information given to you following your procedure. If we do not reach you, we will leave a message.  However, if you are feeling well and you are not experiencing any problems, there is no need to return our call.  We will assume that you have returned to your regular daily activities without incident.  If any biopsies were taken you will be contacted by phone or by letter within the next 1-3 weeks.  Please call us at [phone] if you have not heard about the biopsies in 3 weeks.    SIGNATURES/CONFIDENTIALITY: You and/or your care partner have signed paperwork which will be entered into your electronic medical record.  These signatures attest to the fact that that the information above on your After Visit Summary has been reviewed and is understood.  Full responsibility of the confidentiality of this discharge information lies with you and/or your care-partner.   Hand outs were given to your care partner for polyps, diverticulosis and hemorrhoids. You may resume your current medications today. Await the biopsy result. Please call if any questions or concerns.

## 2018-05-31 NOTE — Progress Notes (Signed)
Report to PACU, RN, vss, BBS= Clear.  

## 2018-06-01 ENCOUNTER — Telehealth: Payer: Self-pay

## 2018-06-01 NOTE — Telephone Encounter (Signed)
Second follow up phone call attempt, no answer, message left 

## 2018-06-01 NOTE — Telephone Encounter (Signed)
Attempted to reach patient for post-procedure f/u call. Line is busy. Staff will make another attempt to reach him again later today.

## 2018-06-07 ENCOUNTER — Encounter: Payer: Self-pay | Admitting: Gastroenterology

## 2018-06-23 ENCOUNTER — Encounter: Payer: Self-pay | Admitting: Internal Medicine

## 2018-06-28 ENCOUNTER — Encounter: Payer: BC Managed Care – PPO | Admitting: Gastroenterology

## 2018-06-30 ENCOUNTER — Telehealth: Payer: Self-pay | Admitting: Internal Medicine

## 2018-06-30 NOTE — Telephone Encounter (Signed)
Copied from [facility] [id number]. Topic: Quick Communication - Rx Refill/Question >> Jun 30, 2018  3:58 PM Oliver Pila B wrote: Medication: losartan-hydrochlorothiazide (HYZAAR) 100-25 MG tablet [[id number]]   [facility] called b/c they are out of the medication and are needing the medication sent separately in order to fill; contact pharmacy to advise

## 2018-07-01 MED ORDER — LOSARTAN POTASSIUM 100 MG PO TABS: 100.0000 mg | ORAL_TABLET | Freq: Every day | ORAL | 3 refills | Status: DC

## 2018-07-01 MED ORDER — HYDROCHLOROTHIAZIDE 25 MG PO TABS: 25.0000 mg | ORAL_TABLET | Freq: Every day | ORAL | 3 refills | Status: DC

## 2018-07-01 NOTE — Telephone Encounter (Signed)
Are you ok with sending these seperately?

## 2018-07-01 NOTE — Telephone Encounter (Signed)
New rx's sent for each medication

## 2018-07-07 ENCOUNTER — Encounter: Payer: BC Managed Care – PPO | Admitting: Gastroenterology

## 2018-07-13 ENCOUNTER — Encounter: Payer: Self-pay | Admitting: Internal Medicine

## 2018-07-19 ENCOUNTER — Encounter: Payer: Self-pay | Admitting: Internal Medicine

## 2018-08-02 ENCOUNTER — Encounter: Payer: Self-pay | Admitting: Internal Medicine

## 2018-08-02 DIAGNOSIS — L989 Disorder of the skin and subcutaneous tissue, unspecified: Secondary | ICD-10-CM

## 2018-08-17 ENCOUNTER — Other Ambulatory Visit: Payer: Self-pay | Admitting: Internal Medicine

## 2018-10-23 DIAGNOSIS — E559 Vitamin D deficiency, unspecified: Secondary | ICD-10-CM | POA: Insufficient documentation

## 2018-10-23 NOTE — Patient Instructions (Addendum)
  Tests ordered today. Your results will be released to Frederick Fox (or called to you) after review, usually within 72hours after test completion. If any changes need to be made, you will be notified at that same time.   Medications reviewed and updated.  Changes include :   none      Please followup in 6 months for a physical

## 2018-10-23 NOTE — Progress Notes (Signed)
Subjective:    Patient ID: Frederick Fox, male    DOB: 1966/01/15, 53 y.o.   MRN: [id number]  HPI The patient is here for follow up.  Hypertension: He is taking his medication daily. He is compliant with a low sodium diet.  He denies chest pain, palpitations, edema, shortness of breath and regular headaches. He is exercising regularly - 3-5 times a week.       CKD:  He does not take any nsaids.  He drinks a lot of fluids.  He follows with nephrology.    GERD:  He is taking his medication daily as prescribed.  He denies any GERD symptoms and feels his GERD is well controlled.    Prediabetes:  He is compliant with a low sugar/carbohydrate diet.  He is exercising regularly.  Depression: He is taking his medication daily as prescribed. He denies any side effects from the medication. He feels his depression is well controlled and he is happy with his current dose of medication.   Gout;  He is taking allopurinol daily.  He is taking tumeric.  He denies any gout symptoms since he was here last.    Decreased libido:  He wonders about his testosterone level.   Medications and allergies reviewed with patient and updated if appropriate.  Patient Active Problem List   Diagnosis Date Noted  . Vitamin D deficiency 10/23/2018  . Leg swelling 05/23/2018  . GERD (gastroesophageal reflux disease) 04/22/2018  . Weight gain 12/09/2017  . Cervical radiculopathy at C5, chronic 08/19/2017  . Ulnar neuropathy of left upper extremity, mild 08/19/2017  . Bilateral arm weakness 05/18/2017  . Tingling 05/18/2017  . Headache 02/16/2017  . Prediabetes 10/12/2016  . Neck pain, chronic 05/13/2016  . Exercise-induced asthma 03/24/2016  . Hiatal hernia 03/24/2016  . OSA (obstructive sleep apnea) 01/21/2016  . Chronic renal insufficiency, stage III (moderate) ([place]) 01/31/2015  . High-renin essential hypertension 08/27/2013  . Depression 08/22/2013  . MICROSCOPIC HEMATURIA 10/23/2010  . HEMATOSPERMIA  10/23/2010  . GOUT 01/10/2009  . Proteinuria 10/09/2008  . Essential hypertension 03/29/2008    Current Outpatient Medications on File Prior to Visit  Medication Sig Dispense Refill  . acetaminophen (TYLENOL) 500 MG tablet Take 1,000 mg by mouth every 6 (six) hours as needed for mild pain.    [name] albuterol (PROAIR HFA) 108 (90 Base) MCG/ACT inhaler Inhale 1-2 puffs into the lungs every 6 (six) hours as needed. (Patient taking differently: Inhale 2 puffs into the lungs every 6 (six) hours as needed for wheezing or shortness of breath. ) 1 Inhaler 5  . allopurinol (ZYLOPRIM) 100 MG tablet Take 1 tablet (100 mg total) by mouth daily. 30 tablet 6  . amLODipine (NORVASC) 10 MG tablet TAKE ONE TABLET BY MOUTH DAILY 90 tablet 3  . budesonide-formoterol (SYMBICORT) 80-4.5 MCG/ACT inhaler Inhale 2 puffs into the lungs 2 (two) times daily. 1 Inhaler 5  . BYSTOLIC 5 MG tablet TAKE 1 TABLET BY MOUTH ONCE DAILY 90 tablet 1  . cyclobenzaprine (FLEXERIL) 10 MG tablet Take 0.5-1 tablet (5-10 mg) by mouth BID PRN for back pain 20 tablet 0  . famotidine (PEPCID) 20 MG tablet Take 40 mg by mouth daily.    [name] FLUoxetine (PROZAC) 20 MG tablet Take one tablet PO daily in addition to 40 mg for a total of 60 mg daily 90 tablet 3  . FLUoxetine (PROZAC) 40 MG capsule Take 1 capsule (40 mg total) by mouth daily. 90 capsule 3  .  fluticasone (FLONASE) 50 MCG/ACT nasal spray USE TWO SPRAY(S) IN EACH NOSTRIL ONCE DAILY. 16 g 3  . hydrochlorothiazide (HYDRODIURIL) 25 MG tablet Take 1 tablet (25 mg total) by mouth daily. 90 tablet 3  . losartan (COZAAR) 100 MG tablet Take 1 tablet (100 mg total) by mouth daily. 90 tablet 3  . Multiple Vitamin (MULTIVITAMIN) tablet Take 1 tablet by mouth daily.    . rosuvastatin (CRESTOR) 10 MG tablet Take 1 tablet (10 mg total) by mouth daily. 90 tablet 3  . Turmeric 500 MG CAPS Take by mouth daily.    . Vitamin D, Ergocalciferol, (DRISDOL) 50000 units CAPS capsule Take 1 capsule (50,000 Units  total) by mouth every 7 (seven) days. 12 capsule 0   No current facility-administered medications on file prior to visit.     Past Medical History:  Diagnosis Date  . Asthma   . Depression   . GERD (gastroesophageal reflux disease)   . Gout    hx of gout  . H/O hiatal hernia   . Hyperlipidemia   . Hypertension   . Proteinuria 2005  . Sleep apnea    uses c-pap    Past Surgical History:  Procedure Laterality Date  . RENAL BIOPSY  2005   protien in urine, no pathology- nephrologist in [facility]  . WISDOM TOOTH EXTRACTION      Social History   Socioeconomic History  . Marital status: Married    Spouse name: Not on file  . Number of children: 4  . Years of education: 74  . Highest education level: Not on file  Occupational History  . Occupation: Product manager: [employer]  . Financial resource strain: Not on file  . Food insecurity:    Worry: Not on file    Inability: Not on file  . Transportation needs:    Medical: Not on file    Non-medical: Not on file  Tobacco Use  . Smoking status: Former Smoker    Types: Cigars  . Smokeless tobacco: Never Used  . Tobacco comment: occasional ciagar/ last one 2017  Substance and Sexual Activity  . Alcohol use: Yes    Alcohol/week: 2.0 standard drinks    Types: 2 Glasses of wine per week    Comment: 1-2 glasses on the weekend  . Drug use: No  . Sexual activity: Not on file  Lifestyle  . Physical activity:    Days per week: Not on file    Minutes per session: Not on file  . Stress: Not on file  Relationships  . Social connections:    Talks on phone: Not on file    Gets together: Not on file    Attends religious service: Not on file    Active member of club or organization: Not on file    Attends meetings of clubs or organizations: Not on file    Relationship status: Not on file  Other Topics Concern  . Not on file  Social History Narrative   Low salt diet.  Lives with wife and  children in a 2 story home.  Has 2 stepchildren and 2 children of his own.     Works as a [employer].     Education: college.        Family History  Problem Relation Age of Onset  . Hypertension Father   . Parkinson's disease Father   . Hypertension Mother   . Healthy Brother   .  Brain cancer Maternal Grandfather   . Heart attack Paternal Grandfather     Review of Systems  Constitutional: Negative for chills and fever.  Respiratory: Positive for wheezing. Negative for cough and shortness of breath.   Cardiovascular: Negative for chest pain, palpitations and leg swelling.  Neurological: Negative for light-headedness and headaches.       Objective:   Vitals:   10/24/18 1612  BP: 120/82  Pulse: 76  Resp: 16  Temp: 98.4 F (36.9 C)  SpO2: 96%   BP Readings from Last 3 Encounters:  10/24/18 120/82  05/31/18 112/74  05/23/18 110/70   Wt Readings from Last 3 Encounters:  10/24/18 248 lb (112.5 kg)  05/31/18 243 lb (110.2 kg)  05/23/18 245 lb (111.1 kg)   Body mass index is 35.58 kg/m.   Physical Exam    Constitutional: Appears well-developed and well-nourished. No distress.  HENT:  Head: Normocephalic and atraumatic.  Neck: Neck supple. No tracheal deviation present. No thyromegaly present.  No cervical lymphadenopathy Cardiovascular: Normal rate, regular rhythm and normal heart sounds.   No murmur heard. No carotid bruit .  No edema Pulmonary/Chest: Effort normal and breath sounds normal. No respiratory distress. No has no wheezes. No rales.  Skin: Skin is warm and dry. Not diaphoretic.  Psychiatric: Normal mood and affect. Behavior is normal.      Assessment & Plan:    See Problem List for Assessment and Plan of chronic medical problems.

## 2018-10-24 ENCOUNTER — Other Ambulatory Visit (INDEPENDENT_AMBULATORY_CARE_PROVIDER_SITE_OTHER): Payer: BC Managed Care – PPO

## 2018-10-24 ENCOUNTER — Encounter: Payer: Self-pay | Admitting: Internal Medicine

## 2018-10-24 ENCOUNTER — Ambulatory Visit: Payer: BC Managed Care – PPO | Admitting: Internal Medicine

## 2018-10-24 VITALS — BP 120/82 | HR 76 | Temp 98.4°F | Resp 16 | Ht 70.0 in | Wt 248.0 lb

## 2018-10-24 DIAGNOSIS — N183 Chronic kidney disease, stage 3 unspecified: Secondary | ICD-10-CM

## 2018-10-24 DIAGNOSIS — N2889 Other specified disorders of kidney and ureter: Secondary | ICD-10-CM

## 2018-10-24 DIAGNOSIS — F329 Major depressive disorder, single episode, unspecified: Secondary | ICD-10-CM | POA: Diagnosis not present

## 2018-10-24 DIAGNOSIS — I10 Essential (primary) hypertension: Secondary | ICD-10-CM | POA: Diagnosis not present

## 2018-10-24 DIAGNOSIS — E559 Vitamin D deficiency, unspecified: Secondary | ICD-10-CM

## 2018-10-24 DIAGNOSIS — R7303 Prediabetes: Secondary | ICD-10-CM | POA: Diagnosis not present

## 2018-10-24 DIAGNOSIS — R5383 Other fatigue: Secondary | ICD-10-CM

## 2018-10-24 DIAGNOSIS — M1A9XX0 Chronic gout, unspecified, without tophus (tophi): Secondary | ICD-10-CM

## 2018-10-24 DIAGNOSIS — F32A Depression, unspecified: Secondary | ICD-10-CM

## 2018-10-24 DIAGNOSIS — R6882 Decreased libido: Secondary | ICD-10-CM

## 2018-10-24 LAB — CBC WITH DIFFERENTIAL/PLATELET
Basophils Absolute: 0.1 10*3/uL (ref 0.0–0.1)
Basophils Relative: 0.6 % (ref 0.0–3.0)
Eosinophils Absolute: 0.3 10*3/uL (ref 0.0–0.7)
Eosinophils Relative: 2.8 % (ref 0.0–5.0)
HCT: 44.3 % (ref 39.0–52.0)
HEMOGLOBIN: 14.7 g/dL (ref 13.0–17.0)
LYMPHS PCT: 23.6 % (ref 12.0–46.0)
Lymphs Abs: 2.6 10*3/uL (ref 0.7–4.0)
MCHC: 33.2 g/dL (ref 30.0–36.0)
MCV: 77.1 fl — ABNORMAL LOW (ref 78.0–100.0)
Monocytes Absolute: 0.9 10*3/uL (ref 0.1–1.0)
Monocytes Relative: 8.1 % (ref 3.0–12.0)
Neutro Abs: 7.2 10*3/uL (ref 1.4–7.7)
Neutrophils Relative %: 64.9 % (ref 43.0–77.0)
Platelets: 242 10*3/uL (ref 150.0–400.0)
RBC: 5.74 Mil/uL (ref 4.22–5.81)
RDW: 15.3 % (ref 11.5–15.5)
WBC: 11 10*3/uL — ABNORMAL HIGH (ref 4.0–10.5)

## 2018-10-24 LAB — LDL CHOLESTEROL, DIRECT: Direct LDL: 95 mg/dL

## 2018-10-24 LAB — COMPREHENSIVE METABOLIC PANEL
ALBUMIN: 4.2 g/dL (ref 3.5–5.2)
ALT: 25 U/L (ref 0–53)
AST: 21 U/L (ref 0–37)
Alkaline Phosphatase: 84 U/L (ref 39–117)
BUN: 37 mg/dL — ABNORMAL HIGH (ref 6–23)
CO2: 28 mEq/L (ref 19–32)
Calcium: 9.3 mg/dL (ref 8.4–10.5)
Chloride: 103 mEq/L (ref 96–112)
Creatinine, Ser: 2.65 mg/dL — ABNORMAL HIGH (ref 0.40–1.50)
GFR: 27.16 mL/min — ABNORMAL LOW (ref >60.00)
Glucose, Bld: 91 mg/dL (ref 70–99)
Potassium: 3.9 mEq/L (ref 3.5–5.1)
Sodium: 139 mEq/L (ref 135–145)
Total Bilirubin: 0.3 mg/dL (ref 0.2–1.2)
Total Protein: 7.3 g/dL (ref 6.0–8.3)

## 2018-10-24 LAB — LIPID PANEL
Cholesterol: 185 mg/dL (ref 0–200)
HDL: 47.5 mg/dL (ref >39.00)
NonHDL: 137.45
Total CHOL/HDL Ratio: 4
Triglycerides: 275 mg/dL — ABNORMAL HIGH (ref 0.0–149.0)
VLDL: 55 mg/dL — ABNORMAL HIGH (ref 0.0–40.0)

## 2018-10-24 LAB — HEMOGLOBIN A1C: Hgb A1c MFr Bld: 6.6 % — ABNORMAL HIGH (ref 4.6–6.5)

## 2018-10-24 NOTE — Assessment & Plan Note (Signed)
BP well controlled Current regimen effective and well tolerated Continue current medications at current doses cmp  

## 2018-10-24 NOTE — Assessment & Plan Note (Signed)
Controlled, stable Continue current dose of medication  

## 2018-10-24 NOTE — Assessment & Plan Note (Signed)
cmp Kidney function has been stable Following with nephrology

## 2018-10-24 NOTE — Assessment & Plan Note (Signed)
No symptoms since his last visit Continue allopurinol and tumeric

## 2018-10-24 NOTE — Assessment & Plan Note (Signed)
Check testosterone level.  

## 2018-10-24 NOTE — Assessment & Plan Note (Signed)
Ck vitamin d level

## 2018-10-24 NOTE — Assessment & Plan Note (Signed)
Check a1c Low sugar / carb diet Stressed regular exercise Work on weight loss

## 2018-10-25 LAB — VITAMIN D 25 HYDROXY (VIT D DEFICIENCY, FRACTURES): VITD: 19.26 ng/mL — ABNORMAL LOW (ref 30.00–100.00)

## 2018-10-27 ENCOUNTER — Encounter: Payer: Self-pay | Admitting: Internal Medicine

## 2018-10-27 LAB — TESTOSTERONE, FREE & TOTAL
Free Testosterone: 60.8 pg/mL (ref 35.0–155.0)
Testosterone, Total, LC-MS-MS: 304 ng/dL (ref 250–1100)

## 2018-11-21 ENCOUNTER — Encounter: Payer: Self-pay | Admitting: Internal Medicine

## 2018-11-21 MED ORDER — ROSUVASTATIN CALCIUM 10 MG PO TABS: 10.0000 mg | ORAL_TABLET | ORAL | 3 refills | Status: DC

## 2018-11-27 ENCOUNTER — Other Ambulatory Visit: Payer: Self-pay | Admitting: Internal Medicine

## 2018-12-12 ENCOUNTER — Encounter: Payer: Self-pay | Admitting: Internal Medicine

## 2018-12-22 NOTE — Progress Notes (Signed)
Subjective:    Patient ID: Frederick Fox, male    DOB: 06/05/1966, 53 y.o.   MRN: [id number]  HPI The patient is here for an acute visit.  He stopped the crestor two weeks ago.  He started having pain in his hands when he started the medication one month ago.  He stopped it two weeks ago.  When he wakes up in the [place] he has 9/10 finger pain, which lasts for about one hour.  His pain the rest of the day and  now about 3/10.   The knuckles feel puffy, but he denies swelling or redness.  He denies N/T in the fingers or toes.  He has not taken anything for it since he can not take nsaids.  He has tried ice/heat.    He denies other joint pain, fever/chills.     Diabetes:  He has been more compliant with a low sugar/carb diet.  He is exercising.  He is still working on weight loss.    Hypertension, CKD: He is taking his medication daily. He is compliant with a low sodium diet.       Medications and allergies reviewed with patient and updated if appropriate.  Patient Active Problem List   Diagnosis Date Noted  . Decreased libido 10/24/2018  . Vitamin D deficiency 10/23/2018  . Leg swelling 05/23/2018  . GERD (gastroesophageal reflux disease) 04/22/2018  . Weight gain 12/09/2017  . Cervical radiculopathy at C5, chronic 08/19/2017  . Ulnar neuropathy of left upper extremity, mild 08/19/2017  . Bilateral arm weakness 05/18/2017  . Tingling 05/18/2017  . Headache 02/16/2017  . Prediabetes 10/12/2016  . Neck pain, chronic 05/13/2016  . Exercise-induced asthma 03/24/2016  . Hiatal hernia 03/24/2016  . OSA (obstructive sleep apnea) 01/21/2016  . Chronic renal insufficiency, stage III (moderate) ([place]) 01/31/2015  . High-renin essential hypertension 08/27/2013  . Depression 08/22/2013  . MICROSCOPIC HEMATURIA 10/23/2010  . HEMATOSPERMIA 10/23/2010  . GOUT 01/10/2009  . Proteinuria 10/09/2008  . Essential hypertension 03/29/2008    Current Outpatient Medications on File Prior  to Visit  Medication Sig Dispense Refill  . acetaminophen (TYLENOL) 500 MG tablet Take 1,000 mg by mouth every 6 (six) hours as needed for mild pain.    [name] albuterol (PROAIR HFA) 108 (90 Base) MCG/ACT inhaler Inhale 1-2 puffs into the lungs every 6 (six) hours as needed. (Patient taking differently: Inhale 2 puffs into the lungs every 6 (six) hours as needed for wheezing or shortness of breath. ) 1 Inhaler 5  . allopurinol (ZYLOPRIM) 100 MG tablet Take 1 tablet (100 mg total) by mouth daily. 30 tablet 6  . amLODipine (NORVASC) 10 MG tablet TAKE ONE TABLET BY MOUTH DAILY 90 tablet 3  . budesonide-formoterol (SYMBICORT) 80-4.5 MCG/ACT inhaler Inhale 2 puffs into the lungs 2 (two) times daily. 1 Inhaler 5  . BYSTOLIC 5 MG tablet TAKE 1 TABLET BY MOUTH ONCE DAILY 90 tablet 1  . cyclobenzaprine (FLEXERIL) 10 MG tablet Take 0.5-1 tablet (5-10 mg) by mouth BID PRN for back pain 20 tablet 0  . famotidine (PEPCID) 20 MG tablet Take 40 mg by mouth daily.    [name] FLUoxetine (PROZAC) 20 MG tablet Take one tablet PO daily in addition to 40 mg for a total of 60 mg daily 90 tablet 3  . FLUoxetine (PROZAC) 40 MG capsule Take 1 capsule (40 mg total) by mouth daily. 90 capsule 3  . fluticasone (FLONASE) 50 MCG/ACT nasal spray USE 2 SPRAY(S) IN  EACH NOSTRIL ONCE DAILY 16 g 1  . hydrochlorothiazide (HYDRODIURIL) 25 MG tablet Take 1 tablet (25 mg total) by mouth daily. 90 tablet 3  . losartan (COZAAR) 100 MG tablet Take 1 tablet (100 mg total) by mouth daily. 90 tablet 3  . Multiple Vitamin (MULTIVITAMIN) tablet Take 1 tablet by mouth daily.    . rosuvastatin (CRESTOR) 10 MG tablet Take 1 tablet (10 mg total) by mouth 3 (three) times a week. 90 tablet 3  . Turmeric 500 MG CAPS Take by mouth daily.    . Vitamin D, Ergocalciferol, (DRISDOL) 50000 units CAPS capsule Take 1 capsule (50,000 Units total) by mouth every 7 (seven) days. 12 capsule 0   No current facility-administered medications on file prior to visit.      Past Medical History:  Diagnosis Date  . Asthma   . Depression   . GERD (gastroesophageal reflux disease)   . Gout    hx of gout  . H/O hiatal hernia   . Hyperlipidemia   . Hypertension   . Proteinuria 2005  . Sleep apnea    uses c-pap    Past Surgical History:  Procedure Laterality Date  . RENAL BIOPSY  2005   protien in urine, no pathology- nephrologist in [facility]  . WISDOM TOOTH EXTRACTION      Social History   Socioeconomic History  . Marital status: Married    Spouse name: Not on file  . Number of children: 4  . Years of education: 64  . Highest education level: Not on file  Occupational History  . Occupation: Product manager: [employer]  . Financial resource strain: Not on file  . Food insecurity:    Worry: Not on file    Inability: Not on file  . Transportation needs:    Medical: Not on file    Non-medical: Not on file  Tobacco Use  . Smoking status: Former Smoker    Types: Cigars  . Smokeless tobacco: Never Used  . Tobacco comment: occasional ciagar/ last one 2017  Substance and Sexual Activity  . Alcohol use: Yes    Alcohol/week: 2.0 standard drinks    Types: 2 Glasses of wine per week    Comment: 1-2 glasses on the weekend  . Drug use: No  . Sexual activity: Not on file  Lifestyle  . Physical activity:    Days per week: Not on file    Minutes per session: Not on file  . Stress: Not on file  Relationships  . Social connections:    Talks on phone: Not on file    Gets together: Not on file    Attends religious service: Not on file    Active member of club or organization: Not on file    Attends meetings of clubs or organizations: Not on file    Relationship status: Not on file  Other Topics Concern  . Not on file  Social History Narrative   Low salt diet.  Lives with wife and children in a 2 story home.  Has 2 stepchildren and 2 children of his own.     Works as a [employer].     Education:  college.        Family History  Problem Relation Age of Onset  . Hypertension Father   . Parkinson's disease Father   . Hypertension Mother   . Healthy Brother   . Brain cancer Maternal Grandfather   .  Heart attack Paternal Grandfather     Review of Systems Per HPI    Objective:   Vitals:   12/23/18 1536  BP: 126/76  Pulse: (!) 53  Resp: 16  Temp: 98.6 F (37 C)  SpO2: 98%   BP Readings from Last 3 Encounters:  12/23/18 126/76  10/24/18 120/82  05/31/18 112/74   Wt Readings from Last 3 Encounters:  12/23/18 251 lb 12.8 oz (114.2 kg)  10/24/18 248 lb (112.5 kg)  05/31/18 243 lb (110.2 kg)   Body mass index is 36.13 kg/m.   Physical Exam Constitutional:      General: He is not in acute distress.    Appearance: Normal appearance. He is not ill-appearing.  Musculoskeletal:     Comments: B/l MCP joint tenderness and mild PIP joint tenderness, no DIP tenderness.  No joint swelling or deformities  Skin:    General: Skin is warm and dry.     Findings: No erythema.  Neurological:     Mental Status: He is alert.            Assessment & Plan:    See Problem List for Assessment and Plan of chronic medical problems.

## 2018-12-23 ENCOUNTER — Other Ambulatory Visit: Payer: Self-pay

## 2018-12-23 ENCOUNTER — Ambulatory Visit: Payer: BC Managed Care – PPO | Admitting: Internal Medicine

## 2018-12-23 ENCOUNTER — Encounter: Payer: Self-pay | Admitting: Internal Medicine

## 2018-12-23 ENCOUNTER — Other Ambulatory Visit (INDEPENDENT_AMBULATORY_CARE_PROVIDER_SITE_OTHER): Payer: BC Managed Care – PPO

## 2018-12-23 VITALS — BP 126/76 | HR 53 | Temp 98.6°F | Resp 16 | Ht 70.0 in | Wt 251.8 lb

## 2018-12-23 DIAGNOSIS — M25541 Pain in joints of right hand: Secondary | ICD-10-CM

## 2018-12-23 DIAGNOSIS — N183 Chronic kidney disease, stage 3 unspecified: Secondary | ICD-10-CM

## 2018-12-23 DIAGNOSIS — Z794 Long term (current) use of insulin: Secondary | ICD-10-CM

## 2018-12-23 DIAGNOSIS — M2550 Pain in unspecified joint: Secondary | ICD-10-CM | POA: Insufficient documentation

## 2018-12-23 DIAGNOSIS — M25542 Pain in joints of left hand: Secondary | ICD-10-CM

## 2018-12-23 DIAGNOSIS — E119 Type 2 diabetes mellitus without complications: Secondary | ICD-10-CM

## 2018-12-23 LAB — CBC WITH DIFFERENTIAL/PLATELET
BASOS ABS: 0.1 10*3/uL (ref 0.0–0.1)
Basophils Relative: 0.7 % (ref 0.0–3.0)
Eosinophils Absolute: 0.6 10*3/uL (ref 0.0–0.7)
Eosinophils Relative: 5.4 % — ABNORMAL HIGH (ref 0.0–5.0)
HCT: 39.6 % (ref 39.0–52.0)
Hemoglobin: 13.3 g/dL (ref 13.0–17.0)
Lymphocytes Relative: 21.6 % (ref 12.0–46.0)
Lymphs Abs: 2.2 10*3/uL (ref 0.7–4.0)
MCHC: 33.7 g/dL (ref 30.0–36.0)
MCV: 77.8 fl — ABNORMAL LOW (ref 78.0–100.0)
Monocytes Absolute: 0.7 10*3/uL (ref 0.1–1.0)
Monocytes Relative: 7.2 % (ref 3.0–12.0)
NEUTROS ABS: 6.6 10*3/uL (ref 1.4–7.7)
NEUTROS PCT: 65.1 % (ref 43.0–77.0)
PLATELETS: 225 10*3/uL (ref 150.0–400.0)
RBC: 5.09 Mil/uL (ref 4.22–5.81)
RDW: 15.7 % — ABNORMAL HIGH (ref 11.5–15.5)
WBC: 10.2 10*3/uL (ref 4.0–10.5)

## 2018-12-23 LAB — COMPREHENSIVE METABOLIC PANEL
ALBUMIN: 3.9 g/dL (ref 3.5–5.2)
ALT: 18 U/L (ref 0–53)
AST: 17 U/L (ref 0–37)
Alkaline Phosphatase: 74 U/L (ref 39–117)
BILIRUBIN TOTAL: 0.2 mg/dL (ref 0.2–1.2)
BUN: 39 mg/dL — ABNORMAL HIGH (ref 6–23)
CO2: 25 mEq/L (ref 19–32)
Calcium: 8.8 mg/dL (ref 8.4–10.5)
Chloride: 106 mEq/L (ref 96–112)
Creatinine, Ser: 2.3 mg/dL — ABNORMAL HIGH (ref 0.40–1.50)
GFR: 30.07 mL/min — AB (ref >60.00)
Glucose, Bld: 163 mg/dL — ABNORMAL HIGH (ref 70–99)
Potassium: 3.7 mEq/L (ref 3.5–5.1)
Sodium: 139 mEq/L (ref 135–145)
Total Protein: 6.6 g/dL (ref 6.0–8.3)

## 2018-12-23 LAB — HEMOGLOBIN A1C: Hgb A1c MFr Bld: 6.7 % — ABNORMAL HIGH (ref 4.6–6.5)

## 2018-12-23 LAB — SEDIMENTATION RATE: Sed Rate: 38 mm/hr — ABNORMAL HIGH (ref 0–20)

## 2018-12-23 LAB — C-REACTIVE PROTEIN: CRP: <1 mg/dL (ref 0.5–20.0)

## 2018-12-23 NOTE — Patient Instructions (Signed)
  Tests ordered today. Your results will be released to [place] (or called to you) after review, usually within 72hours after test completion. If any changes need to be made, you will be notified at that same time.  Medications reviewed and updated.  Changes include :   none

## 2018-12-24 NOTE — Assessment & Plan Note (Signed)
More compliant with low sugar/cab diet Exercising Working on weight loss a1c

## 2018-12-24 NOTE — Assessment & Plan Note (Signed)
cmp

## 2018-12-24 NOTE — Assessment & Plan Note (Signed)
B/l hands joint pain - severe in the morning for one hour then mild the rest of the day.  No other joint pain Initially thought to be related to crestor - medication was stopped - no improvement in pain Check ccp, rf, ana, esr, crp, cbc, cmp May need to see rheum depending on results

## 2018-12-26 ENCOUNTER — Encounter: Payer: Self-pay | Admitting: Internal Medicine

## 2018-12-27 ENCOUNTER — Encounter: Payer: Self-pay | Admitting: Internal Medicine

## 2018-12-27 DIAGNOSIS — M25541 Pain in joints of right hand: Secondary | ICD-10-CM

## 2018-12-27 DIAGNOSIS — M25542 Pain in joints of left hand: Secondary | ICD-10-CM

## 2018-12-27 LAB — CYCLIC CITRUL PEPTIDE ANTIBODY, IGG: Cyclic Citrullin Peptide Ab: <16 U

## 2018-12-27 LAB — RHEUMATOID FACTOR: Rheumatoid fact SerPl-aCnc: <14 IU/mL (ref <14)

## 2018-12-27 LAB — ANA: Anti Nuclear Antibody(ANA): NEGATIVE

## 2018-12-28 MED ORDER — PREDNISONE 10 MG PO TABS: ORAL_TABLET | ORAL | 0 refills | Status: DC

## 2019-01-06 ENCOUNTER — Encounter: Payer: Self-pay | Admitting: Internal Medicine

## 2019-02-19 ENCOUNTER — Encounter: Payer: Self-pay | Admitting: Internal Medicine

## 2019-03-04 ENCOUNTER — Other Ambulatory Visit: Payer: Self-pay | Admitting: Internal Medicine

## 2019-05-18 ENCOUNTER — Other Ambulatory Visit: Payer: Self-pay | Admitting: Internal Medicine

## 2019-06-07 ENCOUNTER — Other Ambulatory Visit: Payer: Self-pay | Admitting: Internal Medicine

## 2019-07-03 ENCOUNTER — Encounter: Payer: Self-pay | Admitting: Internal Medicine

## 2019-07-03 MED ORDER — FLUOXETINE HCL 40 MG PO CAPS: 40.0000 mg | ORAL_CAPSULE | Freq: Every day | ORAL | 0 refills | Status: DC

## 2019-07-04 ENCOUNTER — Telehealth: Payer: Self-pay | Admitting: Pulmonary Disease

## 2019-07-04 NOTE — Telephone Encounter (Signed)
Pt retuned call appt set.Frederick Fox

## 2019-07-04 NOTE — Telephone Encounter (Signed)
LMTCB.   Last OV was 2017. We can send order but insurance will not cover being that patient has not been in office. Will need OV appt.

## 2019-07-04 NOTE — Telephone Encounter (Signed)
ATC pt, no answer. Left message for pt to call back.  

## 2019-07-13 ENCOUNTER — Encounter: Payer: Self-pay | Admitting: Pulmonary Disease

## 2019-07-13 ENCOUNTER — Other Ambulatory Visit: Payer: Self-pay

## 2019-07-13 ENCOUNTER — Ambulatory Visit: Payer: BC Managed Care – PPO | Admitting: Pulmonary Disease

## 2019-07-13 VITALS — BP 132/70 | HR 62 | Temp 97.3°F | Ht 70.0 in | Wt 241.2 lb

## 2019-07-13 DIAGNOSIS — J45990 Exercise induced bronchospasm: Secondary | ICD-10-CM

## 2019-07-13 DIAGNOSIS — G4733 Obstructive sleep apnea (adult) (pediatric): Secondary | ICD-10-CM | POA: Diagnosis not present

## 2019-07-13 NOTE — Progress Notes (Signed)
   Subjective:    Patient ID: Frederick Fox, male    DOB: [birth year], 53 y.o.   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  He underwent split-night study in 01/2016 and was placed on CPAP of 11 cm.  He has tolerated this well with good improvement in his daytime somnolence and fatigue.  For the past few months he has been feeling mild fatigue and sleepy in the daytime.  Download was reviewed which shows excellent compliance about 6-1/2 hours per night, good control of events 11 cm with EPR of 3+3 cm, minimal leak  Significant tests/ events reviewed  Split-night study 01/2016 AHI 20/hour with lowest desaturation 84% corrected by CPAP of 9 cm with full facemask with lowest desaturation 84% corrected by CPAP of 9 cm with full facemask  Review of Systems neg for any significant sore throat, dysphagia, itching, sneezing, nasal congestion or excess/ purulent secretions, fever, chills, sweats, unintended wt loss, pleuritic or exertional cp, hempoptysis, orthopnea pnd or change in chronic leg swelling. Also denies presyncope, palpitations, heartburn, abdominal pain, nausea, vomiting, diarrhea or change in bowel or urinary habits, dysuria,hematuria, rash, arthralgias, visual complaints, headache, numbness weakness or ataxia.     Objective:   Physical Exam   Gen. Pleasant, obese, in no distress, normal affect ENT - no pallor,icterus, no post nasal drip, class 2-3 airway Neck: No JVD, no thyromegaly, no carotid bruits Lungs: no use of accessory muscles, no dullness to percussion, decreased without rales or rhonchi  Cardiovascular: Rhythm regular, heart sounds  normal, no murmurs or gallops, no peripheral edema Abdomen: soft and non-tender, no hepatosplenomegaly, BS normal. Musculoskeletal: No deformities, no cyanosis or clubbing Neuro:  alert, non focal, no tremors        Assessment & Plan:

## 2019-07-13 NOTE — Patient Instructions (Addendum)
  Tests ordered today. Your results will be released to [place] (or called to you) after review.  If any changes need to be made, you will be notified at that same time.    shingrix and pneumonia immunization administered today.   Medications reviewed and updated.  Changes include :   none  Your prescription(s) have been submitted to your pharmacy. Please take as directed and contact our office if you believe you are having problem(s) with the medication(s).   Please followup in 6 months

## 2019-07-13 NOTE — Patient Instructions (Signed)
CPAP supplies will be renewed for a year. CPAP is set at 11 cm and is working well.  Try extending sleep time by 30 minutes, perhaps an earlier bedtime of 10 PM

## 2019-07-13 NOTE — Assessment & Plan Note (Signed)
CPAP supplies will be renewed for a year. CPAP is set at 11 cm and is working well.  Try extending sleep time by 30 minutes, perhaps an earlier bedtime of 10 PM  Weight loss encouraged, compliance with goal of at least 4-6 hrs every night is the expectation. Advised against medications with sedative side effects Cautioned against driving when sleepy - understanding that sleepiness will vary on a day to day basis

## 2019-07-13 NOTE — Progress Notes (Signed)
Subjective:    Patient ID: Frederick Fox, male    DOB: March 15, 1966, 53 y.o.   MRN: [id number]  HPI The patient is here for follow up.  He is exercising regularly - walking 2-5 miles.  He has not been able to lose more weight.  Hypertension: He is taking his medication daily. He is compliant with a low sodium diet.  He denies chest pain, palpitations, edema, shortness of breath and regular headaches.  Diabetes: He is taking his medication daily as prescribed. He is compliant with a diabetic diet.      OSA, exercise induced asthma:  He is following with pulmonary.  He is using his cpap nightly.  He does feel tired during the mid afternoon and could just feel asleep.    GERD:  He is taking his medication daily as prescribed.  He denies any GERD symptoms and feels his GERD is well controlled.   Depression: He is taking his medication daily as prescribed. He denies any side effects from the medication. He feels his depression is well controlled and he is happy with his current dose of medication.   Gout:  He is taking allopurinol daily.  He denies any gout flares since he was here last.  He does have some hand joint pain and his nephrologist thinks it could be related to gout.  Vitamin D def:  He is taking vitamin d daily.   Medications and allergies reviewed with patient and updated if appropriate.  Patient Active Problem List   Diagnosis Date Noted  . Joint pain 12/23/2018  . Decreased libido 10/24/2018  . Vitamin D deficiency 10/23/2018  . Leg swelling 05/23/2018  . GERD (gastroesophageal reflux disease) 04/22/2018  . Weight gain 12/09/2017  . Cervical radiculopathy at C5, chronic 08/19/2017  . Ulnar neuropathy of left upper extremity, mild 08/19/2017  . Bilateral arm weakness 05/18/2017  . Tingling 05/18/2017  . Headache 02/16/2017  . Diabetes ([place]) 10/12/2016  . Neck pain, chronic 05/13/2016  . Exercise-induced asthma 03/24/2016  . Hiatal hernia 03/24/2016  . OSA  (obstructive sleep apnea) 01/21/2016  . Chronic renal insufficiency, stage III (moderate) ([place]) 01/31/2015  . High-renin essential hypertension 08/27/2013  . Depression 08/22/2013  . MICROSCOPIC HEMATURIA 10/23/2010  . HEMATOSPERMIA 10/23/2010  . GOUT 01/10/2009  . Proteinuria 10/09/2008  . Essential hypertension 03/29/2008    Current Outpatient Medications on File Prior to Visit  Medication Sig Dispense Refill  . acetaminophen (TYLENOL) 500 MG tablet Take 1,000 mg by mouth every 6 (six) hours as needed for mild pain.    [name] albuterol (PROAIR HFA) 108 (90 Base) MCG/ACT inhaler Inhale 1-2 puffs into the lungs every 6 (six) hours as needed. (Patient taking differently: Inhale 2 puffs into the lungs every 6 (six) hours as needed for wheezing or shortness of breath. ) 1 Inhaler 5  . allopurinol (ZYLOPRIM) 300 MG tablet Take 1 tablet by mouth daily.    [name] amLODipine (NORVASC) 10 MG tablet Take 1 tablet (10 mg total) by mouth daily. Need office visit for more refills 30 tablet 0  . budesonide-formoterol (SYMBICORT) 80-4.5 MCG/ACT inhaler Inhale 2 puffs into the lungs 2 (two) times daily. (Patient taking differently: Inhale 2 puffs into the lungs 2 (two) times daily as needed. ) 1 Inhaler 5  . famotidine (PEPCID) 20 MG tablet Take 40 mg by mouth daily.    [name] FLUoxetine (PROZAC) 20 MG tablet Take one tablet PO daily in addition to 40 mg for a total of  60 mg daily 90 tablet 3  . FLUoxetine (PROZAC) 40 MG capsule Take 1 capsule (40 mg total) by mouth daily. Need office visit for more refills. 30 capsule 0  . fluticasone (FLONASE) 50 MCG/ACT nasal spray USE 2 SPRAY(S) IN EACH NOSTRIL ONCE DAILY 16 g 1  . losartan (COZAAR) 100 MG tablet Take 1 tablet (100 mg total) by mouth daily. 90 tablet 3  . Multiple Vitamin (MULTIVITAMIN) tablet Take 1 tablet by mouth daily.    . nebivolol (BYSTOLIC) 5 MG tablet Take 1 tablet (5 mg total) by mouth daily. -- Office visit needed for further refills 30 tablet 0  .  Turmeric 500 MG CAPS Take by mouth daily.     No current facility-administered medications on file prior to visit.     Past Medical History:  Diagnosis Date  . Asthma   . Depression   . GERD (gastroesophageal reflux disease)   . Gout    hx of gout  . H/O hiatal hernia   . Hyperlipidemia   . Hypertension   . Proteinuria 2005  . Sleep apnea    uses c-pap    Past Surgical History:  Procedure Laterality Date  . RENAL BIOPSY  2005   protien in urine, no pathology- nephrologist in [facility]  . WISDOM TOOTH EXTRACTION      Social History   Socioeconomic History  . Marital status: Married    Spouse name: Not on file  . Number of children: 4  . Years of education: 52  . Highest education level: Not on file  Occupational History  . Occupation: Product manager: [employer]  . Financial resource strain: Not on file  . Food insecurity    Worry: Not on file    Inability: Not on file  . Transportation needs    Medical: Not on file    Non-medical: Not on file  Tobacco Use  . Smoking status: Former Smoker    Types: Cigars  . Smokeless tobacco: Never Used  . Tobacco comment: occasional ciagar/ last one 2017  Substance and Sexual Activity  . Alcohol use: Yes    Alcohol/week: 2.0 standard drinks    Types: 2 Glasses of wine per week    Comment: 1-2 glasses on the weekend  . Drug use: No  . Sexual activity: Not on file  Lifestyle  . Physical activity    Days per week: Not on file    Minutes per session: Not on file  . Stress: Not on file  Relationships  . Social Herbalist on phone: Not on file    Gets together: Not on file    Attends religious service: Not on file    Active member of club or organization: Not on file    Attends meetings of clubs or organizations: Not on file    Relationship status: Not on file  Other Topics Concern  . Not on file  Social History Narrative   Low salt diet.  Lives with wife and children in a  2 story home.  Has 2 stepchildren and 2 children of his own.     Works as a third [employer].     Education: college.        Family History  Problem Relation Age of Onset  . Hypertension Father   . Parkinson's disease Father   . Hypertension Mother   . Healthy Brother   . Brain cancer Maternal Grandfather   .  Heart attack Paternal Grandfather     Review of Systems  Constitutional: Negative for chills and fever.  Respiratory: Positive for wheezing. Negative for cough and shortness of breath.   Cardiovascular: Negative for chest pain, palpitations and leg swelling.  Neurological: Negative for light-headedness and headaches.  Psychiatric/Behavioral: Positive for dysphoric mood (controlled).       Objective:   Vitals:   07/14/19 1544  BP: 120/72  Pulse: (!) 58  Temp: 97.9 F (36.6 C)  SpO2: 97%   BP Readings from Last 3 Encounters:  07/14/19 120/72  07/13/19 132/70  12/23/18 126/76   Wt Readings from Last 3 Encounters:  07/14/19 243 lb (110.2 kg)  07/13/19 241 lb 3.2 oz (109.4 kg)  12/23/18 251 lb 12.8 oz (114.2 kg)   Body mass index is 34.87 kg/m.   Physical Exam    Constitutional: Appears well-developed and well-nourished. No distress.  HENT:  Head: Normocephalic and atraumatic.  Neck: Neck supple. No tracheal deviation present. No thyromegaly present.  No cervical lymphadenopathy Cardiovascular: Normal rate, regular rhythm and normal heart sounds.   No murmur heard. No carotid bruit .  No edema Pulmonary/Chest: Effort normal and breath sounds normal. No respiratory distress. No has no wheezes. No rales.  Skin: Skin is warm and dry. Not diaphoretic.  Psychiatric: Normal mood and affect. Behavior is normal.      Assessment & Plan:    See Problem List for Assessment and Plan of chronic medical problems.

## 2019-07-13 NOTE — Assessment & Plan Note (Signed)
Continue Symbicort  and albuterol PRN

## 2019-07-14 ENCOUNTER — Other Ambulatory Visit: Payer: Self-pay

## 2019-07-14 ENCOUNTER — Encounter: Payer: Self-pay | Admitting: Internal Medicine

## 2019-07-14 ENCOUNTER — Ambulatory Visit (INDEPENDENT_AMBULATORY_CARE_PROVIDER_SITE_OTHER): Payer: BC Managed Care – PPO | Admitting: Internal Medicine

## 2019-07-14 ENCOUNTER — Other Ambulatory Visit (INDEPENDENT_AMBULATORY_CARE_PROVIDER_SITE_OTHER): Payer: BC Managed Care – PPO

## 2019-07-14 VITALS — BP 120/72 | HR 58 | Temp 97.9°F | Ht 70.0 in | Wt 243.0 lb

## 2019-07-14 DIAGNOSIS — I10 Essential (primary) hypertension: Secondary | ICD-10-CM

## 2019-07-14 DIAGNOSIS — M1A9XX0 Chronic gout, unspecified, without tophus (tophi): Secondary | ICD-10-CM

## 2019-07-14 DIAGNOSIS — N2889 Other specified disorders of kidney and ureter: Secondary | ICD-10-CM

## 2019-07-14 DIAGNOSIS — Z125 Encounter for screening for malignant neoplasm of prostate: Secondary | ICD-10-CM

## 2019-07-14 DIAGNOSIS — Z794 Long term (current) use of insulin: Secondary | ICD-10-CM | POA: Diagnosis not present

## 2019-07-14 DIAGNOSIS — E119 Type 2 diabetes mellitus without complications: Secondary | ICD-10-CM

## 2019-07-14 DIAGNOSIS — Z23 Encounter for immunization: Secondary | ICD-10-CM

## 2019-07-14 DIAGNOSIS — N1832 Chronic kidney disease, stage 3b: Secondary | ICD-10-CM

## 2019-07-14 DIAGNOSIS — F32A Depression, unspecified: Secondary | ICD-10-CM

## 2019-07-14 DIAGNOSIS — F329 Major depressive disorder, single episode, unspecified: Secondary | ICD-10-CM

## 2019-07-14 DIAGNOSIS — E559 Vitamin D deficiency, unspecified: Secondary | ICD-10-CM

## 2019-07-14 DIAGNOSIS — K219 Gastro-esophageal reflux disease without esophagitis: Secondary | ICD-10-CM

## 2019-07-14 LAB — CBC WITH DIFFERENTIAL/PLATELET
Basophils Absolute: 0 10*3/uL (ref 0.0–0.1)
Basophils Relative: 0.2 % (ref 0.0–3.0)
Eosinophils Absolute: 0.6 10*3/uL (ref 0.0–0.7)
Eosinophils Relative: 6.5 % — ABNORMAL HIGH (ref 0.0–5.0)
HCT: 43.5 % (ref 39.0–52.0)
Hemoglobin: 14.2 g/dL (ref 13.0–17.0)
Lymphocytes Relative: 19.7 % (ref 12.0–46.0)
Lymphs Abs: 1.9 10*3/uL (ref 0.7–4.0)
MCHC: 32.7 g/dL (ref 30.0–36.0)
MCV: 80.3 fl (ref 78.0–100.0)
Monocytes Absolute: 0.7 10*3/uL (ref 0.1–1.0)
Monocytes Relative: 7.3 % (ref 3.0–12.0)
Neutro Abs: 6.5 10*3/uL (ref 1.4–7.7)
Neutrophils Relative %: 66.3 % (ref 43.0–77.0)
Platelets: 226 10*3/uL (ref 150.0–400.0)
RBC: 5.41 Mil/uL (ref 4.22–5.81)
RDW: 16 % — ABNORMAL HIGH (ref 11.5–15.5)
WBC: 9.8 10*3/uL (ref 4.0–10.5)

## 2019-07-14 LAB — COMPREHENSIVE METABOLIC PANEL
ALT: 16 U/L (ref 0–53)
AST: 17 U/L (ref 0–37)
Albumin: 4 g/dL (ref 3.5–5.2)
Alkaline Phosphatase: 92 U/L (ref 39–117)
BUN: 25 mg/dL — ABNORMAL HIGH (ref 6–23)
CO2: 28 mEq/L (ref 19–32)
Calcium: 8.8 mg/dL (ref 8.4–10.5)
Chloride: 104 mEq/L (ref 96–112)
Creatinine, Ser: 2.11 mg/dL — ABNORMAL HIGH (ref 0.40–1.50)
GFR: 33.14 mL/min — ABNORMAL LOW (ref >60.00)
Glucose, Bld: 128 mg/dL — ABNORMAL HIGH (ref 70–99)
Potassium: 4.3 mEq/L (ref 3.5–5.1)
Sodium: 139 mEq/L (ref 135–145)
Total Bilirubin: 0.3 mg/dL (ref 0.2–1.2)
Total Protein: 6.6 g/dL (ref 6.0–8.3)

## 2019-07-14 LAB — LIPID PANEL
Cholesterol: 223 mg/dL — ABNORMAL HIGH (ref 0–200)
HDL: 38.7 mg/dL — ABNORMAL LOW (ref >39.00)
Total CHOL/HDL Ratio: 6
Triglycerides: 600 mg/dL — ABNORMAL HIGH (ref 0.0–149.0)

## 2019-07-14 LAB — VITAMIN D 25 HYDROXY (VIT D DEFICIENCY, FRACTURES): VITD: 11.16 ng/mL — ABNORMAL LOW (ref 30.00–100.00)

## 2019-07-14 LAB — HEMOGLOBIN A1C: Hgb A1c MFr Bld: 6.4 % (ref 4.6–6.5)

## 2019-07-14 LAB — LDL CHOLESTEROL, DIRECT: Direct LDL: 121 mg/dL

## 2019-07-14 LAB — URIC ACID: Uric Acid, Serum: 4.9 mg/dL (ref 4.0–7.8)

## 2019-07-14 MED ORDER — FLUOXETINE HCL 20 MG PO TABS: ORAL_TABLET | ORAL | 3 refills | Status: DC

## 2019-07-14 MED ORDER — AMLODIPINE BESYLATE 10 MG PO TABS: 10.0000 mg | ORAL_TABLET | Freq: Every day | ORAL | 0 refills | Status: DC

## 2019-07-14 NOTE — Assessment & Plan Note (Signed)
Check A1c Compliant with diabetic diet, exercising Trying to work on weight loss, but has not been very successful Diabetes currently diet controlled

## 2019-07-14 NOTE — Assessment & Plan Note (Signed)
BP well controlled Current regimen effective and well tolerated Continue current medications at current doses cmp  

## 2019-07-14 NOTE — Assessment & Plan Note (Signed)
?   Stiffness in fingers Check uric acid level On allopurinol

## 2019-07-14 NOTE — Assessment & Plan Note (Signed)
Taking vitamin D Chronic kidney disease Check level

## 2019-07-14 NOTE — Assessment & Plan Note (Signed)
Following with Dr Hollie Salk Cmp, cbc, vitamin d

## 2019-07-14 NOTE — Assessment & Plan Note (Signed)
Controlled, stable Continue current dose of medication  

## 2019-07-14 NOTE — Assessment & Plan Note (Signed)
GERD controlled Continue daily medication  

## 2019-07-17 LAB — PSA, TOTAL AND FREE
PSA, % Free: 44 % (calc) (ref >25)
PSA, Free: 0.8 ng/mL
PSA, Total: 1.8 ng/mL (ref <=4.0)

## 2019-07-18 ENCOUNTER — Encounter: Payer: Self-pay | Admitting: Internal Medicine

## 2019-07-24 ENCOUNTER — Other Ambulatory Visit: Payer: Self-pay | Admitting: Internal Medicine

## 2019-07-24 MED ORDER — NEBIVOLOL HCL 5 MG PO TABS: 5.0000 mg | ORAL_TABLET | Freq: Every day | ORAL | 1 refills | Status: DC

## 2019-07-28 ENCOUNTER — Ambulatory Visit: Payer: BC Managed Care – PPO

## 2019-08-09 ENCOUNTER — Encounter: Payer: Self-pay | Admitting: Internal Medicine

## 2019-08-09 MED ORDER — FLUOXETINE HCL 40 MG PO CAPS: 80.0000 mg | ORAL_CAPSULE | Freq: Every day | ORAL | 1 refills | Status: DC

## 2019-08-09 MED ORDER — FAMOTIDINE 20 MG PO TABS: 40.0000 mg | ORAL_TABLET | Freq: Every day | ORAL | Status: DC

## 2019-09-14 ENCOUNTER — Other Ambulatory Visit: Payer: Self-pay

## 2019-09-14 ENCOUNTER — Ambulatory Visit (INDEPENDENT_AMBULATORY_CARE_PROVIDER_SITE_OTHER): Payer: BC Managed Care – PPO

## 2019-09-14 DIAGNOSIS — Z299 Encounter for prophylactic measures, unspecified: Secondary | ICD-10-CM

## 2019-09-14 DIAGNOSIS — Z23 Encounter for immunization: Secondary | ICD-10-CM

## 2019-09-15 ENCOUNTER — Encounter: Payer: Self-pay | Admitting: Internal Medicine

## 2019-09-15 MED ORDER — BUDESONIDE-FORMOTEROL FUMARATE 80-4.5 MCG/ACT IN AERO: 2.0000 | INHALATION_SPRAY | Freq: Two times a day (BID) | RESPIRATORY_TRACT | 5 refills | Status: DC

## 2019-09-15 NOTE — Telephone Encounter (Signed)
Refilled inhaler, but the allopurinol has never been refilled by you. pls advise on refill for allopurinol.Marland KitchenJohny Fox

## 2019-09-16 MED ORDER — ALLOPURINOL 300 MG PO TABS: 300.0000 mg | ORAL_TABLET | Freq: Every day | ORAL | 1 refills | Status: DC

## 2019-10-09 ENCOUNTER — Encounter: Payer: Self-pay | Admitting: Internal Medicine

## 2019-10-09 ENCOUNTER — Other Ambulatory Visit: Payer: Self-pay | Admitting: *Deleted

## 2019-10-09 MED ORDER — LOSARTAN POTASSIUM 100 MG PO TABS: 100.0000 mg | ORAL_TABLET | Freq: Every day | ORAL | 2 refills | Status: DC

## 2019-10-09 MED ORDER — AMLODIPINE BESYLATE 10 MG PO TABS: 10.0000 mg | ORAL_TABLET | Freq: Every day | ORAL | 2 refills | Status: DC

## 2019-11-28 ENCOUNTER — Encounter: Payer: Self-pay | Admitting: Internal Medicine

## 2019-11-29 ENCOUNTER — Other Ambulatory Visit: Payer: Self-pay

## 2019-11-29 MED ORDER — NEBIVOLOL HCL 5 MG PO TABS: 5.0000 mg | ORAL_TABLET | Freq: Every day | ORAL | 0 refills | Status: DC

## 2019-11-29 MED ORDER — BUDESONIDE-FORMOTEROL FUMARATE 80-4.5 MCG/ACT IN AERO: 2.0000 | INHALATION_SPRAY | Freq: Two times a day (BID) | RESPIRATORY_TRACT | 5 refills | Status: DC

## 2019-12-05 NOTE — Progress Notes (Signed)
Subjective:    Patient ID: Frederick Fox, male    DOB: [birth year], 54 y.o.   MRN: [id number]  HPI The patient is here for an acute visit for fatigue.  Due for follow up of chronic medical problems.   He is taking all of his medications as prescribed.    Low energy:  His energy has been now for a long time, but it has gotten worse.  He could take nap at any time. He is using his cpap nightly.  He wonders if his low testosterone will contribute.   He does his pelaton almost daily.   Right middle finger trigger finger, pain and stiffness.  He denies n/t in fingers.    Medications and allergies reviewed with patient and updated if appropriate.  Patient Active Problem List   Diagnosis Date Noted  . Joint pain 12/23/2018  . Decreased libido 10/24/2018  . Vitamin D deficiency 10/23/2018  . Leg swelling 05/23/2018  . GERD (gastroesophageal reflux disease) 04/22/2018  . Weight gain 12/09/2017  . Cervical radiculopathy at C5, chronic 08/19/2017  . Ulnar neuropathy of left upper extremity, mild 08/19/2017  . Bilateral arm weakness 05/18/2017  . Tingling 05/18/2017  . Headache 02/16/2017  . Diabetes ([place]) 10/12/2016  . Neck pain, chronic 05/13/2016  . Exercise-induced asthma 03/24/2016  . Hiatal hernia 03/24/2016  . OSA (obstructive sleep apnea) 01/21/2016  . Chronic renal insufficiency, stage III (moderate) ([place]) 01/31/2015  . High-renin essential hypertension 08/27/2013  . Depression 08/22/2013  . MICROSCOPIC HEMATURIA 10/23/2010  . GOUT 01/10/2009  . Proteinuria 10/09/2008  . Essential hypertension 03/29/2008    Current Outpatient Medications on File Prior to Visit  Medication Sig Dispense Refill  . acetaminophen (TYLENOL) 500 MG tablet Take 1,000 mg by mouth every 6 (six) hours as needed for mild pain.    [name] albuterol (PROAIR HFA) 108 (90 Base) MCG/ACT inhaler Inhale 1-2 puffs into the lungs every 6 (six) hours as needed. (Patient taking differently: Inhale 2 puffs  into the lungs every 6 (six) hours as needed for wheezing or shortness of breath. ) 1 Inhaler 5  . allopurinol (ZYLOPRIM) 300 MG tablet Take 1 tablet (300 mg total) by mouth daily. 90 tablet 1  . amLODipine (NORVASC) 10 MG tablet Take 1 tablet (10 mg total) by mouth daily. 90 tablet 2  . budesonide-formoterol (SYMBICORT) 80-4.5 MCG/ACT inhaler Inhale 2 puffs into the lungs 2 (two) times daily. 1 Inhaler 5  . famotidine (PEPCID) 20 MG tablet Take 2 tablets (40 mg total) by mouth daily.    [name] FLUoxetine (PROZAC) 40 MG capsule Take 2 capsules (80 mg total) by mouth daily. 180 capsule 1  . fluticasone (FLONASE) 50 MCG/ACT nasal spray USE 2 SPRAY(S) IN EACH NOSTRIL ONCE DAILY 16 g 1  . losartan (COZAAR) 100 MG tablet Take 1 tablet (100 mg total) by mouth daily. 90 tablet 2  . Multiple Vitamin (MULTIVITAMIN) tablet Take 1 tablet by mouth daily.    . nebivolol (BYSTOLIC) 5 MG tablet Take 1 tablet (5 mg total) by mouth daily. 90 tablet 0  . Turmeric 500 MG CAPS Take by mouth daily.     No current facility-administered medications on file prior to visit.    Past Medical History:  Diagnosis Date  . Asthma   . Depression   . GERD (gastroesophageal reflux disease)   . Gout    hx of gout  . H/O hiatal hernia   . Hyperlipidemia   . Hypertension   .  Proteinuria 2005  . Sleep apnea    uses c-pap    Past Surgical History:  Procedure Laterality Date  . RENAL BIOPSY  2005   protien in urine, no pathology- nephrologist in [facility]  . WISDOM TOOTH EXTRACTION      Social History   Socioeconomic History  . Marital status: Married    Spouse name: Not on file  . Number of children: 4  . Years of education: 70  . Highest education level: Not on file  Occupational History  . Occupation: Product manager: [employer]  Tobacco Use  . Smoking status: Former Smoker    Types: Cigars  . Smokeless tobacco: Never Used  . Tobacco comment: occasional ciagar/ last one 2017  Substance  and Sexual Activity  . Alcohol use: Yes    Alcohol/week: 2.0 standard drinks    Types: 2 Glasses of wine per week    Comment: 1-2 glasses on the weekend  . Drug use: No  . Sexual activity: Not on file  Other Topics Concern  . Not on file  Social History Narrative   Low salt diet.  Lives with wife and children in a 2 story home.  Has 2 stepchildren and 2 children of his own.     Works as a third [employer].     Education: college.       Social Determinants of Health   Financial Resource Strain:   . Difficulty of Paying Living Expenses: Not on file  Food Insecurity:   . Worried About Charity fundraiser in the Last Year: Not on file  . Ran Out of Food in the Last Year: Not on file  Transportation Needs:   . Lack of Transportation (Medical): Not on file  . Lack of Transportation (Non-Medical): Not on file  Physical Activity:   . Days of Exercise per Week: Not on file  . Minutes of Exercise per Session: Not on file  Stress:   . Feeling of Stress : Not on file  Social Connections:   . Frequency of Communication with Friends and Family: Not on file  . Frequency of Social Gatherings with Friends and Family: Not on file  . Attends Religious Services: Not on file  . Active Member of Clubs or Organizations: Not on file  . Attends Archivist Meetings: Not on file  . Marital Status: Not on file    Family History  Problem Relation Age of Onset  . Hypertension Father   . Parkinson's disease Father   . Hypertension Mother   . Healthy Brother   . Brain cancer Maternal Grandfather   . Heart attack Paternal Grandfather     Review of Systems  Constitutional: Positive for fatigue. Negative for chills and fever.  Respiratory: Positive for shortness of breath and wheezing (exercise induced). Negative for cough and chest tightness.   Cardiovascular: Negative for chest pain, palpitations and leg swelling.  Musculoskeletal: Positive for arthralgias.  Neurological: Negative  for dizziness, light-headedness, numbness and headaches.       Objective:   Vitals:   12/06/19 1515  BP: 132/80  Pulse: (!) 50  Resp: 16  Temp: 98.8 F (37.1 C)  SpO2: 97%   BP Readings from Last 3 Encounters:  12/06/19 132/80  07/14/19 120/72  07/13/19 132/70   Wt Readings from Last 3 Encounters:  12/06/19 244 lb (110.7 kg)  07/14/19 243 lb (110.2 kg)  07/13/19 241 lb 3.2 oz (109.4 kg)  Body mass index is 35.01 kg/m.   Physical Exam    Constitutional: Appears well-developed and well-nourished. No distress.  Head: Normocephalic and atraumatic.  Neck: Neck supple. No tracheal deviation present. No thyromegaly present.  No cervical lymphadenopathy Cardiovascular: Normal rate, regular rhythm and normal heart sounds.  No murmur heard. No carotid bruit .  Mild b/l LE edema Pulmonary/Chest: Effort normal and breath sounds normal. No respiratory distress. No has no wheezes. No rales.  Skin: Skin is warm and dry. Not diaphoretic.  Psychiatric: Normal mood and affect. Behavior is normal.       Assessment & Plan:    See Problem List for Assessment and Plan of chronic medical problems.    This visit occurred during the SARS-CoV-2 public health emergency.  Safety protocols were in place, including screening questions prior to the visit, additional usage of staff PPE, and extensive cleaning of exam room while observing appropriate contact time as indicated for disinfecting solutions.

## 2019-12-06 ENCOUNTER — Other Ambulatory Visit: Payer: Self-pay

## 2019-12-06 ENCOUNTER — Encounter: Payer: Self-pay | Admitting: Internal Medicine

## 2019-12-06 ENCOUNTER — Ambulatory Visit: Payer: BC Managed Care – PPO | Admitting: Internal Medicine

## 2019-12-06 VITALS — BP 132/80 | HR 50 | Temp 98.8°F | Resp 16 | Ht 70.0 in | Wt 244.0 lb

## 2019-12-06 DIAGNOSIS — R5383 Other fatigue: Secondary | ICD-10-CM

## 2019-12-06 DIAGNOSIS — M79644 Pain in right finger(s): Secondary | ICD-10-CM

## 2019-12-06 DIAGNOSIS — Z794 Long term (current) use of insulin: Secondary | ICD-10-CM

## 2019-12-06 DIAGNOSIS — E559 Vitamin D deficiency, unspecified: Secondary | ICD-10-CM | POA: Diagnosis not present

## 2019-12-06 DIAGNOSIS — N1832 Chronic kidney disease, stage 3b: Secondary | ICD-10-CM

## 2019-12-06 DIAGNOSIS — E119 Type 2 diabetes mellitus without complications: Secondary | ICD-10-CM

## 2019-12-06 DIAGNOSIS — I10 Essential (primary) hypertension: Secondary | ICD-10-CM | POA: Diagnosis not present

## 2019-12-06 LAB — CBC WITH DIFFERENTIAL/PLATELET
Basophils Absolute: 0.1 10*3/uL (ref 0.0–0.1)
Basophils Relative: 0.7 % (ref 0.0–3.0)
Eosinophils Absolute: 0.4 10*3/uL (ref 0.0–0.7)
Eosinophils Relative: 3.8 % (ref 0.0–5.0)
HCT: 40.7 % (ref 39.0–52.0)
Hemoglobin: 13.3 g/dL (ref 13.0–17.0)
Lymphocytes Relative: 18.5 % (ref 12.0–46.0)
Lymphs Abs: 1.7 10*3/uL (ref 0.7–4.0)
MCHC: 32.7 g/dL (ref 30.0–36.0)
MCV: 82.5 fl (ref 78.0–100.0)
Monocytes Absolute: 0.9 10*3/uL (ref 0.1–1.0)
Monocytes Relative: 9.4 % (ref 3.0–12.0)
Neutro Abs: 6.3 10*3/uL (ref 1.4–7.7)
Neutrophils Relative %: 67.6 % (ref 43.0–77.0)
Platelets: 204 10*3/uL (ref 150.0–400.0)
RBC: 4.94 Mil/uL (ref 4.22–5.81)
RDW: 14.6 % (ref 11.5–15.5)
WBC: 9.4 10*3/uL (ref 4.0–10.5)

## 2019-12-06 LAB — COMPREHENSIVE METABOLIC PANEL
ALT: 15 U/L (ref 0–53)
AST: 16 U/L (ref 0–37)
Albumin: 3.8 g/dL (ref 3.5–5.2)
Alkaline Phosphatase: 86 U/L (ref 39–117)
BUN: 34 mg/dL — ABNORMAL HIGH (ref 6–23)
CO2: 24 mEq/L (ref 19–32)
Calcium: 9.1 mg/dL (ref 8.4–10.5)
Chloride: 104 mEq/L (ref 96–112)
Creatinine, Ser: 2.46 mg/dL — ABNORMAL HIGH (ref 0.40–1.50)
GFR: 27.72 mL/min — ABNORMAL LOW (ref >60.00)
Glucose, Bld: 113 mg/dL — ABNORMAL HIGH (ref 70–99)
Potassium: 4 mEq/L (ref 3.5–5.1)
Sodium: 137 mEq/L (ref 135–145)
Total Bilirubin: 0.3 mg/dL (ref 0.2–1.2)
Total Protein: 6.6 g/dL (ref 6.0–8.3)

## 2019-12-06 LAB — VITAMIN D 25 HYDROXY (VIT D DEFICIENCY, FRACTURES): VITD: 11.3 ng/mL — ABNORMAL LOW (ref 30.00–100.00)

## 2019-12-06 LAB — TSH: TSH: 1.17 u[IU]/mL (ref 0.35–4.50)

## 2019-12-06 NOTE — Patient Instructions (Addendum)
  Blood work was ordered.     Medications reviewed and updated.  Changes include :   none   A referral was ordered for hand orthopedic   Please followup in 6 months

## 2019-12-06 NOTE — Assessment & Plan Note (Signed)
Chronic Stable cmp 

## 2019-12-06 NOTE — Assessment & Plan Note (Signed)
Chronic Check a1c Low sugar / carb diet Continue regular exercise Work on weight loss Continue current medications

## 2019-12-06 NOTE — Assessment & Plan Note (Signed)
Taking vitamin d daily Check level 

## 2019-12-06 NOTE — Assessment & Plan Note (Signed)
Chronic, worsening Check testosterone, cbc, cmp, tsh, vitamin d Work on weight loss Continue cpap Continue regular exercise

## 2019-12-06 NOTE — Assessment & Plan Note (Signed)
Chronic BP well controlled Current regimen effective and well tolerated Continue current medications at current doses cmp  

## 2019-12-06 NOTE — Assessment & Plan Note (Signed)
Likely OA, trigger finger Will refer to hand ortho

## 2019-12-07 LAB — HEMOGLOBIN A1C: Hgb A1c MFr Bld: 6.1 % (ref 4.6–6.5)

## 2019-12-09 LAB — TESTOSTERONE, FREE & TOTAL
Free Testosterone: 58.7 pg/mL (ref 35.0–155.0)
Testosterone, Total, LC-MS-MS: 287 ng/dL (ref 250–1100)

## 2019-12-10 ENCOUNTER — Encounter: Payer: Self-pay | Admitting: Internal Medicine

## 2019-12-10 DIAGNOSIS — R7989 Other specified abnormal findings of blood chemistry: Secondary | ICD-10-CM

## 2019-12-13 ENCOUNTER — Encounter: Payer: Self-pay | Admitting: Family Medicine

## 2019-12-13 ENCOUNTER — Other Ambulatory Visit: Payer: Self-pay

## 2019-12-13 ENCOUNTER — Ambulatory Visit: Payer: BC Managed Care – PPO | Admitting: Family Medicine

## 2019-12-13 DIAGNOSIS — M65331 Trigger finger, right middle finger: Secondary | ICD-10-CM | POA: Diagnosis not present

## 2019-12-13 NOTE — Progress Notes (Signed)
   SUBJECTIVE:   CHIEF COMPLAINT / HPI:  54 yo right hand dominant male presents with right middle finger pain. Sometimes "pops". It is painfully to put pressure on it and it locks up after making a fist. Never had trigger finger prior. No prior trauma, injuries, accidents to affected area. Denies paraesthesias.   OBJECTIVE:    General: well appearing male, NAD.  Hand: no obvious abnormalities bilaterally. TTP at distal right, 3rd  MCP. Pain elicited with finger flexion and extension. Palpable nodule finger flexion and extension. Sensation intact.   ASSESSMENT/PLAN:   Trigger Finger Procedure Note Performing Physician: Dr. Junius Roads Procedure: The area was prepped in the usual sterile manner. The needle was inserted into the affected area and 1cc lidocaine 1 % w/o epi w/ 1 mL Depomedrol  was injected.  There were no complications during this procedure. The patient tolerated the procedure well without complications.    Followup:  Standard post-procedure care is explained and return precautions are given.    Wilber Oliphant, MD

## 2019-12-13 NOTE — Progress Notes (Signed)
I saw and examined the patient with Dr. Maudie Mercury and agree with assessment and plan as outlined.    Right third finger triggering, gets stuck in flexion.  Elected to inject with steroid today.  Injected 1 cc 1% lidocaine without epinephrine and 40 mg methylprednisolone into the region of the A1 pulley.  If symptoms persist, consider occupational therapy referral.

## 2019-12-20 ENCOUNTER — Encounter: Payer: Self-pay | Admitting: Internal Medicine

## 2020-01-26 ENCOUNTER — Encounter: Payer: Self-pay | Admitting: Internal Medicine

## 2020-01-29 NOTE — Progress Notes (Signed)
Subjective:    Patient ID: Frederick Fox, male    DOB: [birth year], 54 y.o.   MRN: [id number]  HPI The patient is here for an acute visit.   Ringworm? :  He has a area on his left posterior leg that is red, circular and has the appearance of possible ringworm.  He does have dogs and has been working out in the yard.  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  He states it does not itch.  Looks slightly dry.  He has not tried putting anything on it.  He denies any other skin lesions.  The rash does not itch.   Medications and allergies reviewed with patient and updated if appropriate.  Patient Active Problem List   Diagnosis Date Noted  . Fatigue 12/06/2019  . Finger pain, right 12/06/2019  . Joint pain 12/23/2018  . Decreased libido 10/24/2018  . Vitamin D deficiency 10/23/2018  . Leg swelling 05/23/2018  . GERD (gastroesophageal reflux disease) 04/22/2018  . Weight gain 12/09/2017  . Cervical radiculopathy at C5, chronic 08/19/2017  . Ulnar neuropathy of left upper extremity, mild 08/19/2017  . Bilateral arm weakness 05/18/2017  . Tingling 05/18/2017  . Headache 02/16/2017  . Diabetes ([place]) 10/12/2016  . Neck pain, chronic 05/13/2016  . Exercise-induced asthma 03/24/2016  . Hiatal hernia 03/24/2016  . OSA (obstructive sleep apnea) 01/21/2016  . Chronic renal insufficiency, stage III (moderate) ([place]) 01/31/2015  . High-renin essential hypertension 08/27/2013  . Depression 08/22/2013  . MICROSCOPIC HEMATURIA 10/23/2010  . GOUT 01/10/2009  . Proteinuria 10/09/2008  . Essential hypertension 03/29/2008    Current Outpatient Medications on File Prior to Visit  Medication Sig Dispense Refill  . acetaminophen (TYLENOL) 500 MG tablet Take 1,000 mg by mouth every 6 (six) hours as needed for mild pain.    [name] albuterol (PROAIR HFA) 108 (90 Base) MCG/ACT inhaler Inhale 1-2 puffs into the lungs every 6 (six) hours as needed.  (Patient taking differently: Inhale 2 puffs into the lungs every 6 (six) hours as needed for wheezing or shortness of breath. ) 1 Inhaler 5  . allopurinol (ZYLOPRIM) 300 MG tablet Take 1 tablet (300 mg total) by mouth daily. 90 tablet 1  . amLODipine (NORVASC) 10 MG tablet Take 1 tablet (10 mg total) by mouth daily. 90 tablet 2  . budesonide-formoterol (SYMBICORT) 80-4.5 MCG/ACT inhaler Inhale 2 puffs into the lungs 2 (two) times daily. 1 Inhaler 5  . famotidine (PEPCID) 20 MG tablet Take 2 tablets (40 mg total) by mouth daily.    [name] FLUoxetine (PROZAC) 40 MG capsule Take 2 capsules (80 mg total) by mouth daily. 180 capsule 1  . fluticasone (FLONASE) 50 MCG/ACT nasal spray USE 2 SPRAY(S) IN EACH NOSTRIL ONCE DAILY 16 g 1  . losartan (COZAAR) 100 MG tablet Take 1 tablet (100 mg total) by mouth daily. 90 tablet 2  . Multiple Vitamin (MULTIVITAMIN) tablet Take 1 tablet by mouth daily.    . nebivolol (BYSTOLIC) 5 MG tablet Take 1 tablet (5 mg total) by mouth daily. 90 tablet 0  . Turmeric 500 MG CAPS Take by mouth daily.     No current facility-administered medications on file prior to visit.    Past Medical History:  Diagnosis Date  . Asthma   . Depression   . GERD (gastroesophageal reflux disease)   . Gout    hx of gout  . H/O hiatal  hernia   . Hyperlipidemia   . Hypertension   . Proteinuria 2005  . Sleep apnea    uses c-pap    Past Surgical History:  Procedure Laterality Date  . RENAL BIOPSY  2005   protien in urine, no pathology- nephrologist in [facility]  . WISDOM TOOTH EXTRACTION      Social History   Socioeconomic History  . Marital status: Married    Spouse name: Not on file  . Number of children: 4  . Years of education: 40  . Highest education level: Not on file  Occupational History  . Occupation: Product manager: [employer]  Tobacco Use  . Smoking status: Former Smoker    Types: Cigars  . Smokeless tobacco: Never Used  . Tobacco comment:  occasional ciagar/ last one 2017  Substance and Sexual Activity  . Alcohol use: Yes    Alcohol/week: 2.0 standard drinks    Types: 2 Glasses of wine per week    Comment: 1-2 glasses on the weekend  . Drug use: No  . Sexual activity: Not on file  Other Topics Concern  . Not on file  Social History Narrative   Low salt diet.  Lives with wife and children in a 2 story home.  Has 2 stepchildren and 2 children of his own.     Works as a third [employer].     Education: college.       Social Determinants of Health   Financial Resource Strain:   . Difficulty of Paying Living Expenses:   Food Insecurity:   . Worried About Charity fundraiser in the Last Year:   . Arboriculturist in the Last Year:   Transportation Needs:   . Film/video editor (Medical):   [name] Lack of Transportation (Non-Medical):   Physical Activity:   . Days of Exercise per Week:   . Minutes of Exercise per Session:   Stress:   . Feeling of Stress :   Social Connections:   . Frequency of Communication with Friends and Family:   . Frequency of Social Gatherings with Friends and Family:   . Attends Religious Services:   . Active Member of Clubs or Organizations:   . Attends Archivist Meetings:   [name] Marital Status:     Family History  Problem Relation Age of Onset  . Hypertension Father   . Parkinson's disease Father   . Hypertension Mother   . Healthy Brother   . Brain cancer Maternal Grandfather   . Heart attack Paternal Grandfather     Review of Systems     Objective:   Vitals:   01/30/20 1551  BP: 134/80  Pulse: 67  Resp: 16  Temp: 99.2 F (37.3 C)  SpO2: 99%   BP Readings from Last 3 Encounters:  01/30/20 134/80  12/06/19 132/80  07/14/19 120/72   Wt Readings from Last 3 Encounters:  01/30/20 241 lb (109.3 kg)  12/06/19 244 lb (110.7 kg)  07/14/19 243 lb (110.2 kg)   Body mass index is 34.58 kg/m.   Physical Exam Constitutional:      General: He is not in acute  distress.    Appearance: Normal appearance. He is not ill-appearing.  Skin:    General: Skin is warm and dry.     Comments: Nickel sized slightly raised erythematous patch of dry skin with well-defined borders that are not darker than the central rash, no ooze, no other  visible lesions  Neurological:     Mental Status: He is alert.            Assessment & Plan:    See Problem List for Assessment and Plan of chronic medical problems.    This visit occurred during the SARS-CoV-2 public health emergency.  Safety protocols were in place, including screening questions prior to the visit, additional usage of staff PPE, and extensive cleaning of exam room while observing appropriate contact time as indicated for disinfecting solutions.

## 2020-01-30 ENCOUNTER — Other Ambulatory Visit: Payer: Self-pay

## 2020-01-30 ENCOUNTER — Encounter: Payer: Self-pay | Admitting: Internal Medicine

## 2020-01-30 ENCOUNTER — Ambulatory Visit: Payer: BC Managed Care – PPO | Admitting: Internal Medicine

## 2020-01-30 DIAGNOSIS — R21 Rash and other nonspecific skin eruption: Secondary | ICD-10-CM | POA: Diagnosis not present

## 2020-01-30 MED ORDER — CLOTRIMAZOLE-BETAMETHASONE 1-0.05 % EX CREA: 1.0000 "application " | TOPICAL_CREAM | Freq: Two times a day (BID) | CUTANEOUS | 0 refills | Status: DC

## 2020-01-30 NOTE — Patient Instructions (Addendum)
Try the cream twice a day.  Use the cream for at least 2 weeks.    Call if no improvement let me know.

## 2020-01-30 NOTE — Assessment & Plan Note (Signed)
Acute Nummular eczema versus ringworm We will try Lotrisone cream twice daily for at least 2 weeks-advised to use at least a couple of days after the skin lesion goes away Call if no improvement

## 2020-03-31 ENCOUNTER — Other Ambulatory Visit: Payer: Self-pay | Admitting: Internal Medicine

## 2020-04-01 MED ORDER — NEBIVOLOL HCL 5 MG PO TABS: 5.0000 mg | ORAL_TABLET | Freq: Every day | ORAL | 0 refills | Status: DC

## 2020-04-04 NOTE — Progress Notes (Signed)
Subjective:    Patient ID: Frederick Fox, male    DOB: [birth year], 54 y.o.   MRN: [id number]  HPI The patient is here for an acute visit.  He has been on prozac for 6-7 years.  He thinks it is not helping as much.  He wonders if he should decrease the dose or do something different.  He does not feel very depressed, but just thinks he needs to change.  He is not sure if he wants to transition to a different medication or not.  He wonders about medication for his weight - wegovy.  He is struggling to lose weight.  He is not currently exercising.  He read about this medication and wondered if he could try it.   Medications and allergies reviewed with patient and updated if appropriate.  Patient Active Problem List   Diagnosis Date Noted  . Rash 01/30/2020  . Fatigue 12/06/2019  . Finger pain, right 12/06/2019  . Joint pain 12/23/2018  . Decreased libido 10/24/2018  . Vitamin D deficiency 10/23/2018  . Leg swelling 05/23/2018  . GERD (gastroesophageal reflux disease) 04/22/2018  . Weight gain 12/09/2017  . Cervical radiculopathy at C5, chronic 08/19/2017  . Ulnar neuropathy of left upper extremity, mild 08/19/2017  . Bilateral arm weakness 05/18/2017  . Tingling 05/18/2017  . Headache 02/16/2017  . Diabetes ([place]) 10/12/2016  . Neck pain, chronic 05/13/2016  . Exercise-induced asthma 03/24/2016  . Hiatal hernia 03/24/2016  . OSA (obstructive sleep apnea) 01/21/2016  . Chronic renal insufficiency, stage III (moderate) ([place]) 01/31/2015  . High-renin essential hypertension 08/27/2013  . Depression 08/22/2013  . MICROSCOPIC HEMATURIA 10/23/2010  . GOUT 01/10/2009  . Proteinuria 10/09/2008  . Essential hypertension 03/29/2008    Current Outpatient Medications on File Prior to Visit  Medication Sig Dispense Refill  . acetaminophen (TYLENOL) 500 MG tablet Take 1,000 mg by mouth every 6 (six) hours as needed for mild pain.    [name] albuterol (PROAIR HFA) 108 (90 Base) MCG/ACT  inhaler Inhale 1-2 puffs into the lungs every 6 (six) hours as needed. (Patient taking differently: Inhale 2 puffs into the lungs every 6 (six) hours as needed for wheezing or shortness of breath. ) 1 Inhaler 5  . allopurinol (ZYLOPRIM) 300 MG tablet Take 1 tablet (300 mg total) by mouth daily. 90 tablet 1  . amLODipine (NORVASC) 10 MG tablet Take 1 tablet (10 mg total) by mouth daily. 90 tablet 2  . budesonide-formoterol (SYMBICORT) 80-4.5 MCG/ACT inhaler Inhale 2 puffs into the lungs 2 (two) times daily. 1 Inhaler 5  . clomiPHENE (CLOMID) 50 MG tablet Take 50 mg by mouth daily.    . famotidine (PEPCID) 20 MG tablet Take 2 tablets (40 mg total) by mouth daily.    [name] FLUoxetine (PROZAC) 40 MG capsule Take 2 capsules (80 mg total) by mouth daily. 180 capsule 1  . fluticasone (FLONASE) 50 MCG/ACT nasal spray USE 2 SPRAY(S) IN EACH NOSTRIL ONCE DAILY 16 g 1  . losartan (COZAAR) 100 MG tablet Take 1 tablet (100 mg total) by mouth daily. 90 tablet 2  . Multiple Vitamin (MULTIVITAMIN) tablet Take 1 tablet by mouth daily.    . nebivolol (BYSTOLIC) 5 MG tablet Take 1 tablet (5 mg total) by mouth daily. 90 tablet 0  . Turmeric 500 MG CAPS Take by mouth daily.    . clotrimazole-betamethasone (LOTRISONE) cream Apply 1 application topically 2 (two) times daily. (Patient not taking: Reported on 04/05/2020) 30 g 0  No current facility-administered medications on file prior to visit.    Past Medical History:  Diagnosis Date  . Asthma   . Depression   . GERD (gastroesophageal reflux disease)   . Gout    hx of gout  . H/O hiatal hernia   . Hyperlipidemia   . Hypertension   . Proteinuria 2005  . Sleep apnea    uses c-pap    Past Surgical History:  Procedure Laterality Date  . RENAL BIOPSY  2005   protien in urine, no pathology- nephrologist in [facility]  . WISDOM TOOTH EXTRACTION      Social History   Socioeconomic History  . Marital status: Married    Spouse name: Not on file  . Number of  children: 4  . Years of education: 28  . Highest education level: Not on file  Occupational History  . Occupation: Product manager: [employer]  Tobacco Use  . Smoking status: Former Smoker    Types: Cigars  . Smokeless tobacco: Never Used  . Tobacco comment: occasional ciagar/ last one 2017  Vaping Use  . Vaping Use: Never used  Substance and Sexual Activity  . Alcohol use: Yes    Alcohol/week: 2.0 standard drinks    Types: 2 Glasses of wine per week    Comment: 1-2 glasses on the weekend  . Drug use: No  . Sexual activity: Not on file  Other Topics Concern  . Not on file  Social History Narrative   Low salt diet.  Lives with wife and children in a 2 story home.  Has 2 stepchildren and 2 children of his own.     Works as a third [employer].     Education: college.       Social Determinants of Health   Financial Resource Strain:   . Difficulty of Paying Living Expenses:   Food Insecurity:   . Worried About Charity fundraiser in the Last Year:   . Arboriculturist in the Last Year:   Transportation Needs:   . Film/video editor (Medical):   [name] Lack of Transportation (Non-Medical):   Physical Activity:   . Days of Exercise per Week:   . Minutes of Exercise per Session:   Stress:   . Feeling of Stress :   Social Connections:   . Frequency of Communication with Friends and Family:   . Frequency of Social Gatherings with Friends and Family:   . Attends Religious Services:   . Active Member of Clubs or Organizations:   . Attends Archivist Meetings:   [name] Marital Status:     Family History  Problem Relation Age of Onset  . Hypertension Father   . Parkinson's disease Father   . Hypertension Mother   . Healthy Brother   . Brain cancer Maternal Grandfather   . Heart attack Paternal Grandfather     Review of Systems  Constitutional: Negative for fever.  Respiratory: Negative for shortness of breath.   Cardiovascular: Negative for  chest pain, palpitations and leg swelling.  Neurological: Negative for headaches.       Objective:   Vitals:   04/05/20 1259  BP: 124/80  Pulse: 71  Temp: 98 F (36.7 C)  SpO2: 96%   BP Readings from Last 3 Encounters:  04/05/20 124/80  01/30/20 134/80  12/06/19 132/80   Wt Readings from Last 3 Encounters:  04/05/20 253 lb (114.8 kg)  01/30/20 241 lb (109.3  kg)  12/06/19 244 lb (110.7 kg)   Body mass index is 36.3 kg/m.   Physical Exam    Constitutional: Appears well-developed and well-nourished. No distress.  Head: Normocephalic and atraumatic.  Neck: Neck supple. No tracheal deviation present. No thyromegaly present.  No cervical lymphadenopathy Cardiovascular: Normal rate, regular rhythm and normal heart sounds.  No murmur heard. No carotid bruit .  No edema Pulmonary/Chest: Effort normal and breath sounds normal. No respiratory distress. No has no wheezes. No rales.  Skin: Skin is warm and dry. Not diaphoretic.  Psychiatric: Normal mood and affect. Behavior is normal.       Assessment & Plan:    See Problem List for Assessment and Plan of chronic medical problems.    This visit occurred during the SARS-CoV-2 public health emergency.  Safety protocols were in place, including screening questions prior to the visit, additional usage of staff PPE, and extensive cleaning of exam room while observing appropriate contact time as indicated for disinfecting solutions.

## 2020-04-05 ENCOUNTER — Other Ambulatory Visit: Payer: Self-pay

## 2020-04-05 ENCOUNTER — Ambulatory Visit (INDEPENDENT_AMBULATORY_CARE_PROVIDER_SITE_OTHER): Payer: BC Managed Care – PPO | Admitting: Internal Medicine

## 2020-04-05 ENCOUNTER — Encounter: Payer: Self-pay | Admitting: Internal Medicine

## 2020-04-05 VITALS — BP 124/80 | HR 71 | Temp 98.0°F | Ht 70.0 in | Wt 253.0 lb

## 2020-04-05 DIAGNOSIS — F3289 Other specified depressive episodes: Secondary | ICD-10-CM | POA: Diagnosis not present

## 2020-04-05 DIAGNOSIS — E662 Morbid (severe) obesity with alveolar hypoventilation: Secondary | ICD-10-CM | POA: Insufficient documentation

## 2020-04-05 DIAGNOSIS — E119 Type 2 diabetes mellitus without complications: Secondary | ICD-10-CM | POA: Diagnosis not present

## 2020-04-05 DIAGNOSIS — Z794 Long term (current) use of insulin: Secondary | ICD-10-CM | POA: Diagnosis not present

## 2020-04-05 DIAGNOSIS — Z6837 Body mass index (BMI) 37.0-37.9, adult: Secondary | ICD-10-CM | POA: Insufficient documentation

## 2020-04-05 MED ORDER — FLUOXETINE HCL 20 MG PO CAPS: 20.0000 mg | ORAL_CAPSULE | Freq: Every day | ORAL | 1 refills | Status: DC

## 2020-04-05 MED ORDER — RYBELSUS 3 MG PO TABS: 3.0000 mg | ORAL_TABLET | Freq: Every day | ORAL | 0 refills | Status: DC

## 2020-04-05 MED ORDER — FLUOXETINE HCL 40 MG PO CAPS: 40.0000 mg | ORAL_CAPSULE | Freq: Every day | ORAL | 1 refills | Status: DC

## 2020-04-05 NOTE — Assessment & Plan Note (Signed)
Chronic Sugars have been controlled, But weight is the bigger issue We will start Rybelsus to see if that helps

## 2020-04-05 NOTE — Assessment & Plan Note (Addendum)
Chronic prozac not working as well Decrease prozac to 60 mg daily  he will see how he feels on the 60 mg dose and update me-at that point we can decide to taper off of the medication and start something different or continue the lower dose

## 2020-04-05 NOTE — Assessment & Plan Note (Signed)
Chronic With co-morbidities of htn and DM Discussed weight loss - start exercising regularly, decrease portions, low sugar/carb Discussed options - will try rybelsus 3 mg - will increase to 7 mg after one month

## 2020-04-05 NOTE — Patient Instructions (Signed)
Medications reviewed and updated.  Changes include :   Decrease prozac to 60 mg daily.  Start Rybelsus 3 mg daily.  Your prescription(s) have been submitted to your pharmacy. Please take as directed and contact our office if you believe you are having problem(s) with the medication(s).  Update me in about one month, sooner if needed.

## 2020-05-03 ENCOUNTER — Encounter: Payer: Self-pay | Admitting: Internal Medicine

## 2020-05-04 MED ORDER — RYBELSUS 7 MG PO TABS: 7.0000 mg | ORAL_TABLET | Freq: Every day | ORAL | 1 refills | Status: DC

## 2020-05-13 LAB — BASIC METABOLIC PANEL
BUN: 32 — AB (ref 4–21)
CO2: 22 (ref 13–22)
Chloride: 104 (ref 99–108)
Creatinine: 2.8 — AB (ref 0.6–1.3)
Glucose: 112
Potassium: 4.8 (ref 3.4–5.3)
Sodium: 141 (ref 137–147)

## 2020-05-13 LAB — COMPREHENSIVE METABOLIC PANEL
Albumin: 4 (ref 3.5–5.0)
Calcium: 8.9 (ref 8.7–10.7)
GFR calc Af Amer: 28
GFR calc non Af Amer: 24

## 2020-05-25 ENCOUNTER — Other Ambulatory Visit: Payer: Self-pay | Admitting: Internal Medicine

## 2020-06-03 NOTE — Patient Instructions (Addendum)
  Blood work was ordered.     Medications reviewed and updated.  Changes include :     Decrease prozac to 40 mg for one week, then 20 mg for one week then stop.  Start lexapro 10 mg daily when you start the prozac 20 mg.  Start pravastatin for your cholesterol.  Your prescription(s) have been submitted to your pharmacy. Please take as directed and contact our office if you believe you are having problem(s) with the medication(s).     Please followup in 6 months, SOONER IF NEEDED

## 2020-06-03 NOTE — Progress Notes (Signed)
Subjective:    Patient ID: Frederick Fox, male    DOB: 02-22-1966, 54 y.o.   MRN: [id number]  HPI The patient is here for follow up of their chronic medical problems, including htn, DM, ckd, gout, depression, gerd  His depression is not well controlled - he would like to change his medication.   He is not exercising.  He is watching his sugar intake.  He eats too many carbs.  He does feel like some of his poor lifestyle is related to his depression in a different way of harming himself.  He denies any suicidal ideation.  Medications and allergies reviewed with patient and updated if appropriate.  Patient Active Problem List   Diagnosis Date Noted   Morbid obesity ([place]) 04/05/2020   Rash 01/30/2020   Fatigue 12/06/2019   Finger pain, right 12/06/2019   Joint pain 12/23/2018   Decreased libido 10/24/2018   Vitamin D deficiency 10/23/2018   Leg swelling 05/23/2018   GERD (gastroesophageal reflux disease) 04/22/2018   Weight gain 12/09/2017   Cervical radiculopathy at C5, chronic 08/19/2017   Ulnar neuropathy of left upper extremity, mild 08/19/2017   Bilateral arm weakness 05/18/2017   Tingling 05/18/2017   Headache 02/16/2017   Diabetes ([place]) 10/12/2016   Neck pain, chronic 05/13/2016   Exercise-induced asthma 03/24/2016   Hiatal hernia 03/24/2016   OSA (obstructive sleep apnea) 01/21/2016   Chronic renal insufficiency, stage III (moderate) ([place]) 01/31/2015   High-renin essential hypertension 08/27/2013   Depression 08/22/2013   MICROSCOPIC HEMATURIA 10/23/2010   GOUT 01/10/2009   Proteinuria 10/09/2008   Essential hypertension 03/29/2008    Current Outpatient Medications on File Prior to Visit  Medication Sig Dispense Refill   acetaminophen (TYLENOL) 500 MG tablet Take 1,000 mg by mouth every 6 (six) hours as needed for mild pain.     albuterol (PROAIR HFA) 108 (90 Base) MCG/ACT inhaler Inhale 1-2 puffs into the lungs every 6 (six)  hours as needed. (Patient taking differently: Inhale 2 puffs into the lungs every 6 (six) hours as needed for wheezing or shortness of breath. ) 1 Inhaler 5   allopurinol (ZYLOPRIM) 300 MG tablet Take 1 tablet by mouth once daily 90 tablet 3   amLODipine (NORVASC) 10 MG tablet Take 1 tablet (10 mg total) by mouth daily. 90 tablet 2   budesonide-formoterol (SYMBICORT) 80-4.5 MCG/ACT inhaler Inhale 2 puffs into the lungs 2 (two) times daily. 1 Inhaler 5   clomiPHENE (CLOMID) 50 MG tablet Take 50 mg by mouth daily.     clotrimazole-betamethasone (LOTRISONE) cream Apply 1 application topically 2 (two) times daily. 30 g 0   famotidine (PEPCID) 20 MG tablet Take 2 tablets (40 mg total) by mouth daily.     fluticasone (FLONASE) 50 MCG/ACT nasal spray USE 2 SPRAY(S) IN EACH NOSTRIL ONCE DAILY 16 g 1   losartan (COZAAR) 100 MG tablet Take 1 tablet (100 mg total) by mouth daily. 90 tablet 2   Multiple Vitamin (MULTIVITAMIN) tablet Take 1 tablet by mouth daily.     nebivolol (BYSTOLIC) 5 MG tablet Take 1 tablet (5 mg total) by mouth daily. 90 tablet 0   Semaglutide (RYBELSUS) 7 MG TABS Take 7 mg by mouth daily before breakfast. 90 tablet 1   Turmeric 500 MG CAPS Take by mouth daily.     No current facility-administered medications on file prior to visit.    Past Medical History:  Diagnosis Date   Asthma    Depression  GERD (gastroesophageal reflux disease)    Gout    hx of gout   H/O hiatal hernia    Hyperlipidemia    Hypertension    Proteinuria 2005   Sleep apnea    uses c-pap    Past Surgical History:  Procedure Laterality Date   RENAL BIOPSY  2005   protien in urine, no pathology- nephrologist in [place] History   Socioeconomic History   Marital status: Married    Spouse name: Not on file   Number of children: 4   Years of education: 16   Highest education level: Not on file  Occupational History    Occupation: Product manager: [employer]  Tobacco Use   Smoking status: Former Smoker    Types: Cigars   Smokeless tobacco: Never Used   Tobacco comment: occasional ciagar/ last one 2017  Vaping Use   Vaping Use: Never used  Substance and Sexual Activity   Alcohol use: Yes    Alcohol/week: 2.0 standard drinks    Types: 2 Glasses of wine per week    Comment: 1-2 glasses on the weekend   Drug use: No   Sexual activity: Not on file  Other Topics Concern   Not on file  Social History Narrative   Low salt diet.  Lives with wife and children in a 2 story home.  Has 2 stepchildren and 2 children of his own.     Works as a third [employer].     Education: college.       Social Determinants of Health   Financial Resource Strain:    Difficulty of Paying Living Expenses: Not on file  Food Insecurity:    Worried About Charity fundraiser in the Last Year: Not on file   [employer] of Food in the Last Year: Not on file  Transportation Needs:    Lack of Transportation (Medical): Not on file   Lack of Transportation (Non-Medical): Not on file  Physical Activity:    Days of Exercise per Week: Not on file   Minutes of Exercise per Session: Not on file  Stress:    Feeling of Stress : Not on file  Social Connections:    Frequency of Communication with Friends and Family: Not on file   Frequency of Social Gatherings with Friends and Family: Not on file   Attends Religious Services: Not on file   Active Member of Clubs or Organizations: Not on file   Attends Archivist Meetings: Not on file   Marital Status: Not on file    Family History  Problem Relation Age of Onset   Hypertension Father    Parkinson's disease Father    Hypertension Mother    Healthy Brother    Brain cancer Maternal Grandfather    Heart attack Paternal Grandfather     Review of Systems  Constitutional: Negative for chills and fever.  HENT: Positive for  voice change (hoarseness). Negative for sore throat (scratchy throat).   Respiratory: Negative for cough, shortness of breath and wheezing.   Cardiovascular: Negative for chest pain, palpitations and leg swelling.  Neurological: Negative for light-headedness and headaches.  Psychiatric/Behavioral: Positive for dysphoric mood. The patient is not nervous/anxious.        Objective:   Vitals:   06/04/20 1544  BP: 138/86  Pulse: 69  Temp: 98.2 F (36.8 C)  SpO2: 94%   BP  Readings from Last 3 Encounters:  06/04/20 138/86  04/05/20 124/80  01/30/20 134/80   Wt Readings from Last 3 Encounters:  06/04/20 256 lb (116.1 kg)  04/05/20 253 lb (114.8 kg)  01/30/20 241 lb (109.3 kg)   Body mass index is 36.73 kg/m.   Physical Exam    Constitutional: Appears well-developed and well-nourished. No distress.  HENT:  Head: Normocephalic and atraumatic.  Neck: Neck supple. No tracheal deviation present. No thyromegaly present.  No cervical lymphadenopathy Cardiovascular: Normal rate, regular rhythm and normal heart sounds.   No murmur heard. No carotid bruit .  No edema Pulmonary/Chest: Effort normal and breath sounds normal. No respiratory distress. No has no wheezes. No rales.  Skin: Skin is warm and dry. Not diaphoretic.  Psychiatric: Normal mood and affect. Behavior is normal.      Assessment & Plan:    See Problem List for Assessment and Plan of chronic medical problems.    This visit occurred during the SARS-CoV-2 public health emergency.  Safety protocols were in place, including screening questions prior to the visit, additional usage of staff PPE, and extensive cleaning of exam room while observing appropriate contact time as indicated for disinfecting solutions.

## 2020-06-04 ENCOUNTER — Encounter: Payer: Self-pay | Admitting: Internal Medicine

## 2020-06-04 ENCOUNTER — Ambulatory Visit: Payer: BC Managed Care – PPO | Admitting: Family Medicine

## 2020-06-04 ENCOUNTER — Ambulatory Visit (INDEPENDENT_AMBULATORY_CARE_PROVIDER_SITE_OTHER): Payer: BC Managed Care – PPO | Admitting: Internal Medicine

## 2020-06-04 ENCOUNTER — Other Ambulatory Visit: Payer: Self-pay

## 2020-06-04 VITALS — BP 138/86 | HR 69 | Temp 98.2°F | Ht 70.0 in | Wt 256.0 lb

## 2020-06-04 DIAGNOSIS — I10 Essential (primary) hypertension: Secondary | ICD-10-CM

## 2020-06-04 DIAGNOSIS — E119 Type 2 diabetes mellitus without complications: Secondary | ICD-10-CM

## 2020-06-04 DIAGNOSIS — M1A9XX0 Chronic gout, unspecified, without tophus (tophi): Secondary | ICD-10-CM | POA: Diagnosis not present

## 2020-06-04 DIAGNOSIS — Z794 Long term (current) use of insulin: Secondary | ICD-10-CM

## 2020-06-04 DIAGNOSIS — N1832 Chronic kidney disease, stage 3b: Secondary | ICD-10-CM

## 2020-06-04 DIAGNOSIS — Z1159 Encounter for screening for other viral diseases: Secondary | ICD-10-CM

## 2020-06-04 DIAGNOSIS — F3289 Other specified depressive episodes: Secondary | ICD-10-CM

## 2020-06-04 DIAGNOSIS — K219 Gastro-esophageal reflux disease without esophagitis: Secondary | ICD-10-CM

## 2020-06-04 DIAGNOSIS — E559 Vitamin D deficiency, unspecified: Secondary | ICD-10-CM

## 2020-06-04 MED ORDER — PRAVASTATIN SODIUM 10 MG PO TABS: 10.0000 mg | ORAL_TABLET | Freq: Every day | ORAL | 5 refills | Status: DC

## 2020-06-04 MED ORDER — ESCITALOPRAM OXALATE 10 MG PO TABS: 10.0000 mg | ORAL_TABLET | Freq: Every day | ORAL | 1 refills | Status: DC

## 2020-06-04 NOTE — Assessment & Plan Note (Signed)
Chronic Taking vitamin D daily We will check level

## 2020-06-04 NOTE — Assessment & Plan Note (Signed)
Chronic GERD controlled Continue daily medication  

## 2020-06-04 NOTE — Assessment & Plan Note (Signed)
Chronic Controlled-no symptoms Continue allopurinol daily Check uric acid level

## 2020-06-04 NOTE — Assessment & Plan Note (Signed)
Chronic BP well controlled Current regimen effective and well tolerated Continue current medications at current doses cmp  

## 2020-06-04 NOTE — Assessment & Plan Note (Signed)
Chronic Has been stable CMP today

## 2020-06-04 NOTE — Assessment & Plan Note (Signed)
Chronic Stressed weight loss and he agrees he wants to lose weight I do not think this will be possible though until his depression is better controlled Depression medications being adjusted He is seeing a therapist

## 2020-06-04 NOTE — Assessment & Plan Note (Addendum)
Chronic Not ideally controlled Taper off prozac as directed Start lexapro He is seeing a therapist He does believe some of his poor lifestyle habits are related to his depression and to a degree self-harm

## 2020-06-04 NOTE — Assessment & Plan Note (Signed)
Chronic Check A1c Continue current medications Encourage decreasing carbs, increasing exercise and working on weight loss

## 2020-06-05 LAB — LIPID PANEL
Cholesterol: 201 mg/dL — ABNORMAL HIGH (ref <200)
HDL: 38 mg/dL — ABNORMAL LOW (ref >=40)
LDL Cholesterol (Calc): 111 mg/dL (calc) — ABNORMAL HIGH
Non-HDL Cholesterol (Calc): 163 mg/dL (calc) — ABNORMAL HIGH (ref <130)
Total CHOL/HDL Ratio: 5.3 (calc) — ABNORMAL HIGH (ref <5.0)
Triglycerides: 365 mg/dL — ABNORMAL HIGH (ref <150)

## 2020-06-05 LAB — CBC WITH DIFFERENTIAL/PLATELET
Absolute Monocytes: 686 cells/uL (ref 200–950)
Basophils Absolute: 38 cells/uL (ref 0–200)
Basophils Relative: 0.4 %
Eosinophils Absolute: 282 cells/uL (ref 15–500)
Eosinophils Relative: 3 %
HCT: 41.8 % (ref 38.5–50.0)
Hemoglobin: 13.8 g/dL (ref 13.2–17.1)
Lymphs Abs: 1777 cells/uL (ref 850–3900)
MCH: 27.9 pg (ref 27.0–33.0)
MCHC: 33 g/dL (ref 32.0–36.0)
MCV: 84.4 fL (ref 80.0–100.0)
MPV: 10.1 fL (ref 7.5–12.5)
Monocytes Relative: 7.3 %
Neutro Abs: 6618 cells/uL (ref 1500–7800)
Neutrophils Relative %: 70.4 %
Platelets: 210 10*3/uL (ref 140–400)
RBC: 4.95 10*6/uL (ref 4.20–5.80)
RDW: 13.8 % (ref 11.0–15.0)
Total Lymphocyte: 18.9 %
WBC: 9.4 10*3/uL (ref 3.8–10.8)

## 2020-06-05 LAB — HEMOGLOBIN A1C
Hgb A1c MFr Bld: 6 % of total Hgb — ABNORMAL HIGH (ref <5.7)
Mean Plasma Glucose: 126 (calc)
eAG (mmol/L): 7 (calc)

## 2020-06-05 LAB — COMPLETE METABOLIC PANEL WITH GFR
AG Ratio: 1.8 (calc) (ref 1.0–2.5)
ALT: 22 U/L (ref 9–46)
AST: 22 U/L (ref 10–35)
Albumin: 3.8 g/dL (ref 3.6–5.1)
Alkaline phosphatase (APISO): 55 U/L (ref 35–144)
BUN/Creatinine Ratio: 10 (calc) (ref 6–22)
BUN: 26 mg/dL — ABNORMAL HIGH (ref 7–25)
CO2: 29 mmol/L (ref 20–32)
Calcium: 9 mg/dL (ref 8.6–10.3)
Chloride: 105 mmol/L (ref 98–110)
Creat: 2.68 mg/dL — ABNORMAL HIGH (ref 0.70–1.33)
GFR, Est African American: 30 mL/min/{1.73_m2} — ABNORMAL LOW (ref >=60)
GFR, Est Non African American: 26 mL/min/{1.73_m2} — ABNORMAL LOW (ref >=60)
Globulin: 2.1 g/dL (calc) (ref 1.9–3.7)
Glucose, Bld: 110 mg/dL — ABNORMAL HIGH (ref 65–99)
Potassium: 4.2 mmol/L (ref 3.5–5.3)
Sodium: 140 mmol/L (ref 135–146)
Total Bilirubin: 0.3 mg/dL (ref 0.2–1.2)
Total Protein: 5.9 g/dL — ABNORMAL LOW (ref 6.1–8.1)

## 2020-06-05 LAB — URIC ACID: Uric Acid, Serum: 5.2 mg/dL (ref 4.0–8.0)

## 2020-06-05 LAB — HEPATITIS C ANTIBODY
Hepatitis C Ab: NONREACTIVE
SIGNAL TO CUT-OFF: 0.01 (ref <1.00)

## 2020-06-05 LAB — VITAMIN D 25 HYDROXY (VIT D DEFICIENCY, FRACTURES): Vit D, 25-Hydroxy: 26 ng/mL — ABNORMAL LOW (ref 30–100)

## 2020-06-07 ENCOUNTER — Other Ambulatory Visit: Payer: Self-pay

## 2020-06-07 ENCOUNTER — Ambulatory Visit: Payer: BC Managed Care – PPO | Admitting: Family Medicine

## 2020-06-07 ENCOUNTER — Encounter: Payer: Self-pay | Admitting: Family Medicine

## 2020-06-07 DIAGNOSIS — M65331 Trigger finger, right middle finger: Secondary | ICD-10-CM

## 2020-06-07 NOTE — Progress Notes (Signed)
Office Visit Note   Patient: Frederick Fox           Date of Birth: 03-10-1966           MRN: [id number] Visit Date: 06/07/2020 Requested by: Binnie Rail, MD [place],  [place] [id number] PCP: Binnie Rail, MD  Subjective: Chief Complaint  Patient presents with  . Right Middle Finger - Pain    Painful trigger finger - started again 2 weeks ago. Was injected in March - worked well. Right-hand dominant.    HPI: He is here with recurrent right third trigger finger.  Injection in March worked until about 2 weeks ago.  No injury, he cannot think of anything that caused this.  It gets stuck in flexion frequently.              ROS:   All other systems were reviewed and are negative.  Objective: Vital Signs: There were no vitals taken for this visit.  Physical Exam:  General:  Alert and oriented, in no acute distress. Pulm:  Breathing unlabored. Psy:  Normal mood, congruent affect. Skin: No erythema Right hand: Third finger triggers in flexion slightly, and has a tender nodule at the A1 pulley.  Imaging: No results found.  Assessment & Plan: 1.  Recurrent right third trigger finger -Discussed options with him and he wants to try another injection.  If this does not give long-term relief, then surgical consult.     Procedures: Right third trigger finger injection: After sterile prep with Betadine, injected 1 cc 1% lidocaine without epinephrine and 40 mg methylprednisolone into the region of the A1 pulley.    PMFS History: Patient Active Problem List   Diagnosis Date Noted  . Morbid obesity ([place]) 04/05/2020  . Rash 01/30/2020  . Finger pain, right 12/06/2019  . Joint pain 12/23/2018  . Decreased libido 10/24/2018  . Vitamin D deficiency 10/23/2018  . GERD (gastroesophageal reflux disease) 04/22/2018  . Cervical radiculopathy at C5, chronic 08/19/2017  . Ulnar neuropathy of left upper extremity, mild 08/19/2017  . Bilateral arm weakness  05/18/2017  . Tingling 05/18/2017  . Diabetes ([place]) 10/12/2016  . Neck pain, chronic 05/13/2016  . Exercise-induced asthma 03/24/2016  . Hiatal hernia 03/24/2016  . OSA (obstructive sleep apnea) 01/21/2016  . Chronic renal insufficiency, stage III (moderate) ([place]) 01/31/2015  . High-renin essential hypertension 08/27/2013  . Depression 08/22/2013  . MICROSCOPIC HEMATURIA 10/23/2010  . GOUT 01/10/2009  . Proteinuria 10/09/2008  . Essential hypertension 03/29/2008   Past Medical History:  Diagnosis Date  . Asthma   . Depression   . GERD (gastroesophageal reflux disease)   . Gout    hx of gout  . H/O hiatal hernia   . Hyperlipidemia   . Hypertension   . Proteinuria 2005  . Sleep apnea    uses c-pap    Family History  Problem Relation Age of Onset  . Hypertension Father   . Parkinson's disease Father   . Hypertension Mother   . Healthy Brother   . Brain cancer Maternal Grandfather   . Heart attack Paternal Grandfather     Past Surgical History:  Procedure Laterality Date  . RENAL BIOPSY  2005   protien in urine, no pathology- nephrologist in [facility]  . WISDOM TOOTH EXTRACTION     Social History   Occupational History  . Occupation: Product manager: [employer]  Tobacco Use  . Smoking status: Former Smoker  Types: Cigars  . Smokeless tobacco: Never Used  . Tobacco comment: occasional ciagar/ last one 2017  Vaping Use  . Vaping Use: Never used  Substance and Sexual Activity  . Alcohol use: Yes    Alcohol/week: 2.0 standard drinks    Types: 2 Glasses of wine per week    Comment: 1-2 glasses on the weekend  . Drug use: No  . Sexual activity: Not on file

## 2020-07-12 ENCOUNTER — Encounter: Payer: Self-pay | Admitting: Adult Health

## 2020-07-12 ENCOUNTER — Ambulatory Visit (INDEPENDENT_AMBULATORY_CARE_PROVIDER_SITE_OTHER): Payer: BC Managed Care – PPO | Admitting: Adult Health

## 2020-07-12 ENCOUNTER — Other Ambulatory Visit: Payer: Self-pay

## 2020-07-12 VITALS — BP 124/70 | HR 74 | Temp 98.3°F | Ht 70.0 in | Wt 264.4 lb

## 2020-07-12 DIAGNOSIS — J45990 Exercise induced bronchospasm: Secondary | ICD-10-CM | POA: Diagnosis not present

## 2020-07-12 DIAGNOSIS — G4733 Obstructive sleep apnea (adult) (pediatric): Secondary | ICD-10-CM

## 2020-07-12 DIAGNOSIS — Z23 Encounter for immunization: Secondary | ICD-10-CM

## 2020-07-12 NOTE — Progress Notes (Signed)
@Patient  ID: Frederick Fox, male    DOB: [birth year], 54 y.o.   MRN: [id number]  Chief Complaint  Patient presents with  . Follow-up    OSA     Referring provider: Binnie Rail, MD  HPI: 54 year old male followed for obstructive sleep apnea Patient is a Automotive engineer  TEST/EVENTS :  Split-night study 01/2016 AHI 20/hour with lowest desaturation 84% corrected by CPAP of 9 cm with full facemask  07/12/2020 Follow up : OSA  Patient presents for a 1 year follow-up.  Patient has underlying moderate obstructive sleep apnea.  Is on nocturnal CPAP.  Patient says he is doing very well on CPAP.  He feels rested with no significant daytime sleepiness.  Feels that he benefits from CPAP.  CPAP download shows excellent compliance with 100% usage.  Daily average usage at 7 hours.  Patient is on CPAP 11 cm H2O.  AHI is 1.5.  Minimal leaks.  Patient does have exercise-induced asthma.  Says he is followed by his primary care provider.  Uses Symbicort as needed.  No recent flares.    Allergies  Allergen Reactions  . Lisinopril     cough  . Crestor [Rosuvastatin Calcium]     Fingers joint pain  . Wellbutrin [Bupropion]     nightmares    Immunization History  Administered Date(s) Administered  . Influenza Inj Mdck Quad Pf 06/23/2019  . Influenza,inj,Quad PF,6+ Mos 10/13/2016  . Influenza,trivalent, recombinat, inj, PF 08/08/2018  . Influenza-Unspecified 07/06/2015, 08/12/2017  . PFIZER SARS-COV-2 Vaccination 12/03/2019, 12/29/2019  . Pneumococcal Polysaccharide-23 07/14/2019  . Tdap 09/14/2019  . Zoster Recombinat (Shingrix) 07/14/2019, 09/14/2019    Past Medical History:  Diagnosis Date  . Asthma   . Depression   . GERD (gastroesophageal reflux disease)   . Gout    hx of gout  . H/O hiatal hernia   . Hyperlipidemia   . Hypertension   . Proteinuria 2005  . Sleep apnea    uses c-pap    Tobacco History: Social History   Tobacco Use  Smoking Status Former  Smoker  . Types: Cigars  Smokeless Tobacco Never Used  Tobacco Comment   occasional ciagar/ last one 2017   Counseling given: Not Answered Comment: occasional ciagar/ last one 2017   Outpatient Medications Prior to Visit  Medication Sig Dispense Refill  . acetaminophen (TYLENOL) 500 MG tablet Take 1,000 mg by mouth every 6 (six) hours as needed for mild pain.    [name] albuterol (PROAIR HFA) 108 (90 Base) MCG/ACT inhaler Inhale 1-2 puffs into the lungs every 6 (six) hours as needed. (Patient taking differently: Inhale 2 puffs into the lungs every 6 (six) hours as needed for wheezing or shortness of breath. ) 1 Inhaler 5  . allopurinol (ZYLOPRIM) 300 MG tablet Take 1 tablet by mouth once daily 90 tablet 3  . amLODipine (NORVASC) 10 MG tablet Take 1 tablet (10 mg total) by mouth daily. 90 tablet 2  . budesonide-formoterol (SYMBICORT) 80-4.5 MCG/ACT inhaler Inhale 2 puffs into the lungs 2 (two) times daily. 1 Inhaler 5  . clomiPHENE (CLOMID) 50 MG tablet Take 50 mg by mouth daily.    . clotrimazole-betamethasone (LOTRISONE) cream Apply 1 application topically 2 (two) times daily. 30 g 0  . escitalopram (LEXAPRO) 10 MG tablet Take 1 tablet (10 mg total) by mouth daily. 90 tablet 1  . famotidine (PEPCID) 20 MG tablet Take 2 tablets (40 mg total) by mouth daily.    . fluticasone (FLONASE)  50 MCG/ACT nasal spray USE 2 SPRAY(S) IN EACH NOSTRIL ONCE DAILY 16 g 1  . losartan (COZAAR) 100 MG tablet Take 1 tablet (100 mg total) by mouth daily. 90 tablet 2  . Multiple Vitamin (MULTIVITAMIN) tablet Take 1 tablet by mouth daily.    . nebivolol (BYSTOLIC) 5 MG tablet Take 1 tablet (5 mg total) by mouth daily. 90 tablet 0  . pravastatin (PRAVACHOL) 10 MG tablet Take 1 tablet (10 mg total) by mouth daily. 30 tablet 5  . Semaglutide (RYBELSUS) 7 MG TABS Take 7 mg by mouth daily before breakfast. 90 tablet 1  . Turmeric 500 MG CAPS Take by mouth daily.     No facility-administered medications prior to visit.      Review of Systems:   Constitutional:   No  weight loss, night sweats,  Fevers, chills, fatigue, or  lassitude.  HEENT:   No headaches,  Difficulty swallowing,  Tooth/dental problems, or  Sore throat,                No sneezing, itching, ear ache, nasal congestion, post nasal drip,   CV:  No chest pain,  Orthopnea, PND, swelling in lower extremities, anasarca, dizziness, palpitations, syncope.   GI  No heartburn, indigestion, abdominal pain, nausea, vomiting, diarrhea, change in bowel habits, loss of appetite, bloody stools.   Resp: No shortness of breath with exertion or at rest.  No excess mucus, no productive cough,  No non-productive cough,  No coughing up of blood.  No change in color of mucus.  No wheezing.  No chest wall deformity  Skin: no rash or lesions.  GU: no dysuria, change in color of urine, no urgency or frequency.  No flank pain, no hematuria   MS:  No joint pain or swelling.  No decreased range of motion.  No back pain.    Physical Exam  BP 124/70 (BP Location: Left Arm, Patient Position: Sitting, Cuff Size: Large)   Pulse 74   Temp 98.3 F (36.8 C) (Temporal)   Ht 5\' 10"  (1.778 m)   Wt 264 lb 6.4 oz (119.9 kg)   SpO2 96%   BMI 37.94 kg/m   GEN: A/Ox3; pleasant , NAD, well nourished    HEENT:  Frederick Fox/AT,   NOSE-clear, THROAT-clear, no lesions, no postnasal drip or exudate noted.  Class II MP airway  NECK:  Supple w/ fair ROM; no JVD; normal carotid impulses w/o bruits; no thyromegaly or nodules palpated; no lymphadenopathy.    RESP  Clear  P & A; w/o, wheezes/ rales/ or rhonchi. no accessory muscle use, no dullness to percussion  CARD:  RRR, no m/r/g, no peripheral edema, pulses intact, no cyanosis or clubbing.  GI:   Soft & nt; nml bowel sounds; no organomegaly or masses detected.   Musco: Warm bil, no deformities or joint swelling noted.   Neuro: alert, no focal deficits noted.    Skin: Warm, no lesions or rashes    Lab  Results:    BNP No results found for: BNP  ProBNP No results found for: PROBNP  Imaging: No results found.    No flowsheet data found.  No results found for: NITRICOXIDE      Assessment & Plan:   OSA (obstructive sleep apnea) Excellent control and compliance on nocturnal CPAP  Plan  Patient Instructions  Continue on CPAP At bedtime   Keep up good work  Work on [employer]  Do not drive if sleepy  Flu shot today  Follow up with Dr. Elsworth Soho  In 1 year and As needed        Morbid obesity ([place]) Encouraged on healthy weight loss  Exercise-induced asthma Appears to be well controlled     Rexene Edison, NP 07/12/2020

## 2020-07-12 NOTE — Assessment & Plan Note (Signed)
Appears to be well controlled.   

## 2020-07-12 NOTE — Assessment & Plan Note (Signed)
Encouraged on healthy weight loss 

## 2020-07-12 NOTE — Patient Instructions (Addendum)
Continue on CPAP At bedtime   Keep up good work  Work on [employer]  Do not drive if sleepy  Flu shot today  Follow up with Dr. Elsworth Soho  In 1 year and As needed

## 2020-07-12 NOTE — Assessment & Plan Note (Signed)
Excellent control and compliance on nocturnal CPAP  Plan  Patient Instructions  Continue on CPAP At bedtime   Keep up good work  Work on [employer]  Do not drive if sleepy  Flu shot today  Follow up with Dr. Elsworth Soho  In 1 year and As needed

## 2020-07-14 ENCOUNTER — Encounter: Payer: Self-pay | Admitting: Internal Medicine

## 2020-07-14 MED ORDER — NEBIVOLOL HCL 5 MG PO TABS
5.0000 mg | ORAL_TABLET | Freq: Every day | ORAL | 1 refills | Status: DC
Start: 2020-07-14 — End: 2021-03-04

## 2020-07-14 MED ORDER — ESCITALOPRAM OXALATE 20 MG PO TABS: 20.0000 mg | ORAL_TABLET | Freq: Every day | ORAL | 1 refills | Status: DC

## 2020-07-15 ENCOUNTER — Encounter: Payer: Self-pay | Admitting: Internal Medicine

## 2020-07-17 MED ORDER — RYBELSUS 14 MG PO TABS: 14.0000 mg | ORAL_TABLET | Freq: Every day | ORAL | 1 refills | Status: DC

## 2020-08-06 ENCOUNTER — Other Ambulatory Visit: Payer: Self-pay

## 2020-08-06 ENCOUNTER — Ambulatory Visit (INDEPENDENT_AMBULATORY_CARE_PROVIDER_SITE_OTHER): Payer: BC Managed Care – PPO | Admitting: Family Medicine

## 2020-08-06 ENCOUNTER — Encounter (INDEPENDENT_AMBULATORY_CARE_PROVIDER_SITE_OTHER): Payer: Self-pay | Admitting: Family Medicine

## 2020-08-06 VITALS — BP 121/73 | HR 70 | Temp 98.6°F | Ht 69.0 in | Wt 256.0 lb

## 2020-08-06 DIAGNOSIS — Z9189 Other specified personal risk factors, not elsewhere classified: Secondary | ICD-10-CM | POA: Insufficient documentation

## 2020-08-06 DIAGNOSIS — R5383 Other fatigue: Secondary | ICD-10-CM | POA: Diagnosis not present

## 2020-08-06 DIAGNOSIS — Z9989 Dependence on other enabling machines and devices: Secondary | ICD-10-CM

## 2020-08-06 DIAGNOSIS — F32A Depression, unspecified: Secondary | ICD-10-CM | POA: Insufficient documentation

## 2020-08-06 DIAGNOSIS — E559 Vitamin D deficiency, unspecified: Secondary | ICD-10-CM | POA: Diagnosis not present

## 2020-08-06 DIAGNOSIS — E7849 Other hyperlipidemia: Secondary | ICD-10-CM | POA: Diagnosis not present

## 2020-08-06 DIAGNOSIS — F3289 Other specified depressive episodes: Secondary | ICD-10-CM

## 2020-08-06 DIAGNOSIS — R0602 Shortness of breath: Secondary | ICD-10-CM | POA: Diagnosis not present

## 2020-08-06 DIAGNOSIS — R7303 Prediabetes: Secondary | ICD-10-CM | POA: Insufficient documentation

## 2020-08-06 DIAGNOSIS — Z0289 Encounter for other administrative examinations: Secondary | ICD-10-CM

## 2020-08-06 DIAGNOSIS — Z6837 Body mass index (BMI) 37.0-37.9, adult: Secondary | ICD-10-CM

## 2020-08-06 DIAGNOSIS — G4733 Obstructive sleep apnea (adult) (pediatric): Secondary | ICD-10-CM

## 2020-08-06 DIAGNOSIS — I10 Essential (primary) hypertension: Secondary | ICD-10-CM

## 2020-08-06 DIAGNOSIS — E785 Hyperlipidemia, unspecified: Secondary | ICD-10-CM | POA: Insufficient documentation

## 2020-08-07 LAB — TSH: TSH: 1.06 u[IU]/mL (ref 0.450–4.500)

## 2020-08-07 LAB — INSULIN, RANDOM: INSULIN: 42 u[IU]/mL — ABNORMAL HIGH (ref 2.6–24.9)

## 2020-08-07 LAB — VITAMIN B12: Vitamin B-12: 797 pg/mL (ref 232–1245)

## 2020-08-07 LAB — FOLATE: Folate: 13.9 ng/mL (ref >3.0)

## 2020-08-07 LAB — T3: T3, Total: 116 ng/dL (ref 71–180)

## 2020-08-07 LAB — T4: T4, Total: 7 ug/dL (ref 4.5–12.0)

## 2020-08-07 NOTE — Progress Notes (Signed)
Dear Dr. Quay Burow,   Thank you for referring Frederick Fox to our clinic. The following note includes my evaluation and treatment recommendations.  Chief Complaint:   OBESITY Frederick Fox (MR# [id number]) is a 54 y.o. male who presents for evaluation and treatment of obesity and related comorbidities. Current BMI is Body mass index is 37.8 kg/m. Frederick Fox has been struggling with his weight for many years and has been unsuccessful in either losing weight, maintaining weight loss, or reaching his healthy weight goal.  Frederick Fox is currently in the action stage of change and ready to dedicate time achieving and maintaining a healthier weight. Frederick Fox is interested in becoming our patient and working on intensive lifestyle modifications including (but not limited to) diet and exercise for weight loss.  Frederick Fox lives with his wife, Anderson Malta, 70, and his 2 step-sons, 84 and 53.  He is a [employer].  He has had increasing weight with his depression symptoms.  "Sneaks eating."  Frederick Fox's habits were reviewed today and are as follows: His family eats meals together, he thinks his family will eat healthier with him, his desired weight loss is 70 pounds, he has been heavy most of his life, he started gaining excessive weight recently, his heaviest weight ever was 270 pounds, he craves sushi, pretzels, and ice cream, he is trying to follow a vegetarian diet, he frequently makes poor food choices, he frequently eats larger portions than normal and he struggles with emotional eating.  This is the patient's first visit at [facility].  The patient's NEW PATIENT PACKET that they filled out prior to today's office visit was reviewed at length and information from that paperwork was included within the following office visit note.    Included in the packet: current and past health history, medications, allergies, ROS, gynecologic history (women only), surgical history, family history,  social history, weight history, weight loss surgery history (for those that have had weight loss surgery), nutritional evaluation, mood and food questionnaire along with a depression screening (PHQ9) on all patients, an Epworth questionnaire, sleep habits questionnaire, patient life and health improvement goals questionnaire. These will all be scanned into the patient's chart under media.   During the visit, I independently reviewed the patient's EKG, bioimpedance scale results, and indirect calorimeter results. I used this information to tailor a meal plan for the patient that will help Frederick Fox to lose weight and will improve his obesity-related conditions going forward.  I performed a medically necessary appropriate examination and/or evaluation. I discussed the assessment and treatment plan with the patient. The patient was provided an opportunity to ask questions and all were answered. The patient agreed with the plan and demonstrated an understanding of the instructions. Labs were ordered today (unless patient declined them) and will be reviewed with the patient at our next visit unless more critical results need to be addressed immediately. Clinical information was updated and documented in the EMR.  Time spent on visit including pre-visit chart review and post-visit care was estimated to be 60-74 minutes.  A separate 15 minutes was spent on risk counseling (see above/below).   Depression Screen Wali's Food and Mood (modified PHQ-9) score was 23.  Depression screen [facility] 2/9 08/06/2020  Decreased Interest 3  Down, Depressed, Hopeless 3  PHQ - 2 Score 6  Altered sleeping 3  Tired, decreased energy 3  Change in appetite 3  Feeling bad or failure about yourself  2  Trouble concentrating 1  Moving  slowly or fidgety/restless 2  Suicidal thoughts 3  PHQ-9 Score 23  Difficult doing work/chores Very difficult   Assessment/Plan:   Lab Orders     Vitamin B12     Folate     Insulin, random      T3     T4     TSH   1. Other fatigue Glenroy denies daytime somnolence and reports waking up still tired. Patent has a history of symptoms of morning fatigue and snoring. Alexandra generally gets 6 or 7 hours of sleep per night, and states that he has generally restful sleep. Snoring is present. Apneic episodes are not present. Epworth Sleepiness Score is 8.  Frederick Fox does feel that his weight is causing his energy to be lower than it should be. Fatigue may be related to obesity, depression or many other causes. Labs will be ordered, and in the meanwhile, Frederick Fox will focus on self care including making healthy food choices, increasing physical activity and focusing on stress reduction.  - EKG 12-Lead - Vitamin B12 - Folate - T3 - T4 - TSH  2. SOB (shortness of breath) on exertion Frederick Fox notes increasing shortness of breath with exercising and seems to be worsening over time with weight gain. He notes getting out of breath sooner with activity than he used to. This has gotten worse recently. Frederick Fox denies shortness of breath at rest or orthopnea.  Frederick Fox does feel that he gets out of breath more easily that he used to when he exercises. Frederick Fox's shortness of breath appears to be obesity related and exercise induced. He has agreed to work on weight loss and gradually increase exercise to treat his exercise induced shortness of breath. Will continue to monitor closely.  3. Essential hypertension Frederick Fox is taking Bystolic, Norvasc, and Cozaar for blood pressure control.    Plan:  At goal.  Continue medications.  Will check labs today.  BP Readings from Last 3 Encounters:  08/06/20 121/73  07/12/20 124/70  06/04/20 138/86   4. Other hyperlipidemia He is taking Pravachol 10 mg daily.  Recently had FLP.  No side effects of medication.  Plan:  Continue Pravachol 10 mg daily.  Lab Results  Component Value Date   ALT 22 06/04/2020   AST 22 06/04/2020   ALKPHOS 86 12/06/2019   BILITOT 0.3  06/04/2020   Lab Results  Component Value Date   CHOL 201 (H) 06/04/2020   HDL 38 (L) 06/04/2020   LDLCALC 111 (H) 06/04/2020   LDLDIRECT 121.0 07/14/2019   TRIG 365 (H) 06/04/2020   CHOLHDL 5.3 (H) 06/04/2020   5. Prediabetes Frederick Fox was started on Rybelsus on 04/05/2020.  He is now taking 14 mg daily.  No family history of diabetes, he says.  A1c is 6.0.  Plan:  Continue Rybelsus.  Will check insulin level today.  Lab Results  Component Value Date   HGBA1C 6.0 (H) 06/04/2020   Lab Results  Component Value Date   INSULIN 42.0 (H) 08/06/2020   - Insulin, random  6. OSA on CPAP Frederick Fox has a diagnosis of sleep apnea. He reports that he is using a CPAP regularly.   7. Vitamin D deficiency Frederick Fox has a history of Vitamin D deficiency with resultant generalized fatigue as his primary symptom.  he is taking OTC vitamin D 5,000 IU daily for this deficiency and tolerating it well without side-effect.  Most recent Vitamin D lab reviewed-  level: 26.0 on 06/04/2020.  Plan:   - Discussed importance  of vitamin D (as well as calcium) to their health and wellbeing.   - Educated pt that weight loss will likely improve availability of vitamin D, thus encouraged Yahsir to continue with meal plan and their weight loss efforts to further improve this condition.  - I recommend checking vitamin D level today.  He says he is "at the end of the bottle" of his OTC supplement. -  If vitamin D is not in the 50-70 range, start weekly vitamin D.       8. Other depression with emotional eating He is taking Lexapro 20 mg daily.  PHQ-9 is 23.  Denies SI.  No medication side effects or other concerns. He sees a Social worker, Animal nutritionist, in Biomedical engineer, every 2 weeks.  Plan:  Continue medication, he denies need for changes to txmnt plan.  Continue counseling.  9. At risk for impaired metabolic function Due to Frederick Fox's current state of health and prediabetes and low testosterone, they are  at a significantly higher risk for impaired metabolic function.   This further also puts the patient at much greater risk to also subsequently develop cardiopulmonary conditions that can negatively affect patient's quality of life as well.  At least 26 minutes was spent on counseling Alhaji about these concerns today and I stressed the importance of reversing these risks factors.   Initial goal is to lose at least 5-10% of starting weight to help reduce risk factors.   Counseling: Intensive lifestyle modifications discussed with Syd as most appropriate first line treatment.  he will continue to work on diet, exercise and weight loss efforts.  We will continue to reassess these conditions on a fairly regular basis in an attempt to decrease patient's overall morbidity and mortality  10. Class 2 severe obesity with serious comorbidity and body mass index (BMI) of 37.0 to 37.9 in adult, unspecified obesity type ([name]  Frederick Fox is currently in the action stage of change and his goal is to continue with weight loss efforts. I recommend Frederick Fox begin the structured treatment plan as follows:  He has agreed to the Category 3 Plan.  Exercise goals: As is.   Behavioral modification strategies: increasing lean protein intake, no skipping meals and planning for success.  He was informed of the importance of frequent follow-up visits to maximize his success with intensive lifestyle modifications for his multiple health conditions. He was informed we would discuss his lab results at his next visit unless there is a critical issue that needs to be addressed sooner. Frederick Fox agreed to keep his next visit at the agreed upon time to discuss these results.  Objective:   Blood pressure 121/73, pulse 70, temperature 98.6 F (37 C), height 5\' 9"  (1.753 m), weight 256 lb (116.1 kg), SpO2 96 %. Body mass index is 37.8 kg/m.  EKG: Normal sinus rhythm, rate 73 bpm.  Indirect Calorimeter completed today shows a VO2 of 339  and a REE of 2363.  His calculated basal metabolic rate is 7353 thus his basal metabolic rate is better than expected.  General: Cooperative, alert, well developed, in no acute distress. HEENT: Conjunctivae and lids unremarkable. Cardiovascular: Regular rhythm.  Lungs: Normal work of breathing. Neurologic: No focal deficits.   Lab Results  Component Value Date   CREATININE 2.68 (H) 06/04/2020   BUN 26 (H) 06/04/2020   NA 140 06/04/2020   K 4.2 06/04/2020   CL 105 06/04/2020   CO2 29 06/04/2020   Lab Results  Component Value Date  ALT 22 06/04/2020   AST 22 06/04/2020   ALKPHOS 86 12/06/2019   BILITOT 0.3 06/04/2020   Lab Results  Component Value Date   HGBA1C 6.0 (H) 06/04/2020   HGBA1C 6.1 12/06/2019   HGBA1C 6.4 07/14/2019   HGBA1C 6.7 (H) 12/23/2018   HGBA1C 6.6 (H) 10/24/2018   Lab Results  Component Value Date   INSULIN 42.0 (H) 08/06/2020   Lab Results  Component Value Date   TSH 1.060 08/06/2020   Lab Results  Component Value Date   CHOL 201 (H) 06/04/2020   HDL 38 (L) 06/04/2020   LDLCALC 111 (H) 06/04/2020   LDLDIRECT 121.0 07/14/2019   TRIG 365 (H) 06/04/2020   CHOLHDL 5.3 (H) 06/04/2020   Lab Results  Component Value Date   WBC 9.4 06/04/2020   HGB 13.8 06/04/2020   HCT 41.8 06/04/2020   MCV 84.4 06/04/2020   PLT 210 06/04/2020   Attestation Statements:   Reviewed by clinician on day of visit: allergies, medications, problem list, medical history, surgical history, family history, social history, and previous encounter notes.  I, Water quality scientist, CMA, am acting as Location manager for [employer], DO.  I have reviewed the above documentation for accuracy and completeness, and I agree with the above. Marjory Sneddon, D.O.  The [place] was signed into law in 2016 which includes the topic of electronic health records.  This provides immediate access to information in MyChart.  This includes consultation notes, operative  notes, office notes, lab results and pathology reports.  If you have any questions about what you read please let us know at your next visit so we can discuss your concerns and take corrective action if need be.  We are right here with you.

## 2020-08-20 ENCOUNTER — Ambulatory Visit (INDEPENDENT_AMBULATORY_CARE_PROVIDER_SITE_OTHER): Payer: BC Managed Care – PPO | Admitting: Family Medicine

## 2020-08-20 ENCOUNTER — Other Ambulatory Visit: Payer: Self-pay

## 2020-08-20 VITALS — BP 106/68 | HR 77 | Temp 98.5°F | Ht 69.0 in | Wt 257.0 lb

## 2020-08-20 DIAGNOSIS — E1169 Type 2 diabetes mellitus with other specified complication: Secondary | ICD-10-CM | POA: Diagnosis not present

## 2020-08-20 DIAGNOSIS — Z6838 Body mass index (BMI) 38.0-38.9, adult: Secondary | ICD-10-CM

## 2020-08-20 DIAGNOSIS — E559 Vitamin D deficiency, unspecified: Secondary | ICD-10-CM | POA: Diagnosis not present

## 2020-08-20 DIAGNOSIS — N1831 Chronic kidney disease, stage 3a: Secondary | ICD-10-CM | POA: Diagnosis not present

## 2020-08-20 DIAGNOSIS — E1159 Type 2 diabetes mellitus with other circulatory complications: Secondary | ICD-10-CM | POA: Diagnosis not present

## 2020-08-20 DIAGNOSIS — I152 Hypertension secondary to endocrine disorders: Secondary | ICD-10-CM

## 2020-08-20 DIAGNOSIS — G4733 Obstructive sleep apnea (adult) (pediatric): Secondary | ICD-10-CM

## 2020-08-20 DIAGNOSIS — Z9989 Dependence on other enabling machines and devices: Secondary | ICD-10-CM

## 2020-08-20 DIAGNOSIS — Z9189 Other specified personal risk factors, not elsewhere classified: Secondary | ICD-10-CM

## 2020-08-20 DIAGNOSIS — E785 Hyperlipidemia, unspecified: Secondary | ICD-10-CM

## 2020-08-20 MED ORDER — VITAMIN D (ERGOCALCIFEROL) 1.25 MG (50000 UNIT) PO CAPS: 50000.0000 [IU] | ORAL_CAPSULE | ORAL | 0 refills | Status: DC

## 2020-08-21 ENCOUNTER — Encounter (INDEPENDENT_AMBULATORY_CARE_PROVIDER_SITE_OTHER): Payer: Self-pay | Admitting: Family Medicine

## 2020-08-21 DIAGNOSIS — E785 Hyperlipidemia, unspecified: Secondary | ICD-10-CM | POA: Insufficient documentation

## 2020-08-21 DIAGNOSIS — Z9189 Other specified personal risk factors, not elsewhere classified: Secondary | ICD-10-CM | POA: Insufficient documentation

## 2020-08-21 DIAGNOSIS — N179 Acute kidney failure, unspecified: Secondary | ICD-10-CM | POA: Insufficient documentation

## 2020-08-21 DIAGNOSIS — N1831 Chronic kidney disease, stage 3a: Secondary | ICD-10-CM | POA: Insufficient documentation

## 2020-08-21 DIAGNOSIS — I152 Hypertension secondary to endocrine disorders: Secondary | ICD-10-CM | POA: Insufficient documentation

## 2020-08-21 DIAGNOSIS — E1169 Type 2 diabetes mellitus with other specified complication: Secondary | ICD-10-CM | POA: Insufficient documentation

## 2020-08-21 DIAGNOSIS — E1159 Type 2 diabetes mellitus with other circulatory complications: Secondary | ICD-10-CM | POA: Insufficient documentation

## 2020-08-21 DIAGNOSIS — E119 Type 2 diabetes mellitus without complications: Secondary | ICD-10-CM | POA: Insufficient documentation

## 2020-08-21 NOTE — Progress Notes (Signed)
Chief Complaint:   OBESITY Frederick Fox is here to discuss his progress with his obesity treatment plan along with follow-up of his obesity related diagnoses. Frederick Fox is on the Category 3 Plan and states he is following his eating plan approximately 100% of the time. Frederick Fox states he is exercising for 0 minutes 0 times per week.  Today's visit was #: 2 Starting weight: 256 lbs Starting date: 08/06/2020 Today's weight: 257 lbs Today's date: 08/20/2020 Total lbs lost to date: +1 lb Total lbs lost since last in-office visit: +1 lb  Interim History: Frederick Fox says that things have been going well, but he has been dealing with constipation for the past 3-4 days.  He drinks 50-60 ounces per day of water.  Not as tired.  Sleeping well.  Hunger and cravings are controlled.  He says he has never been told in the past by any doctor to limit his water intake.  Here to review all recent labs with [name].  Plan:  Today, with review of labs, he meets certain criteria for diabetes mellitus diagnosis with A1c of 6.6 in 10/2018 and 6.7 in 12/2018.  Also, his lab review revealed he has CKD and he will need to ask his renal doctor about protein intake per day restrictions and water restrictions, if any.  Assessment/Plan:   No orders of the defined types were placed in this encounter.  Meds ordered this encounter  Medications  . Vitamin D, Ergocalciferol, (DRISDOL) 1.25 MG (50000 UNIT) CAPS capsule    Sig: Take 1 capsule (50,000 Units total) by mouth every 7 (seven) days.    Dispense:  4 capsule    Refill:  0    1. Vitamin D deficiency New.  Discussed labs with patient today.    Frederick Fox's Vitamin D level was 26.0 on 06/04/2020. He is currently taking OTC vitamin D each day. He denies nausea, vomiting or muscle weakness.  He was told to take an OTC vitamin D supplement.  Plan:  Change from OTC to prescription vitamin D.  Recheck in 3-4 months.  -Start Vitamin D, Ergocalciferol, (DRISDOL) 1.25 MG (50000 UNIT)  CAPS capsule; Take 1 capsule (50,000 Units total) by mouth every 7 (seven) days.  Dispense: 4 capsule; Refill: 0    2. Type 2 diabetes mellitus with other specified complication, without long-term current use of insulin ([place]) New.   Discussed labs with patient today.    He is on Rybelsus, which was started in June by his PCP.  Increased dose on 07/16/2020.  He was not told to check his blood sugars and was never given a glucometer.  Denies lows or concerns.  Plan:  Treatment per PCP.  In lieu of new information of diabetes and CKD stage 3-4, importance of blood sugar and blood pressure control stressed to him today.  Cut simple carbs and weight loss via prudent nutritional plan.  Lab Results  Component Value Date   HGBA1C 6.0 (H) 06/04/2020   HGBA1C 6.1 12/06/2019   HGBA1C 6.4 07/14/2019   Lab Results  Component Value Date   MICROALBUR 251.7 (H) 10/09/2008   LDLCALC 111 (H) 06/04/2020   CREATININE 2.68 (H) 06/04/2020   Lab Results  Component Value Date   INSULIN 42.0 (H) 08/06/2020     3. Hypertension associated with type 2 diabetes mellitus ([place]) Discussed labs with patient today.    No symptoms or concerns.  No hypotension signs.  Does not check at home.  He has a long history of poorly  controlled blood pressure for many years, and I suspect this is initial course of CKD.  Plan:  Keep an eye on blood pressure while he loses weight.  Told to use home blood pressure monitor and check daily.  Follow treatment plan per Renal and PCP.  Follow-up with them.  Low salt, exercise  BP Readings from Last 3 Encounters:  08/20/20 106/68  08/06/20 121/73  07/12/20 124/70     4. Hyperlipidemia associated with type 2 diabetes mellitus ([place]) New.   Discussed labs with patient today.   Started on Pravachol at the end of August by his PCP.  Tolerating well.  Plan:  He is to follow-up with his PCP regarding treatment of chronic conditions including HLD.  Goal LDL is <70 with diabetes.  Education done  Lab Results  Component Value Date   ALT 22 06/04/2020   AST 22 06/04/2020   ALKPHOS 86 12/06/2019   BILITOT 0.3 06/04/2020   Lab Results  Component Value Date   CHOL 201 (H) 06/04/2020   HDL 38 (L) 06/04/2020   LDLCALC 111 (H) 06/04/2020   LDLDIRECT 121.0 07/14/2019   TRIG 365 (H) 06/04/2020   CHOLHDL 5.3 (H) 06/04/2020     5. Stage 3a chronic kidney disease ([place]) New.   Worsening.    Discussed labs with patient today.  Sees [facility], which he just told me about today.  Not sure what caused his kidneys to fail, but creatinine 1.4 on 10/09/2008, then 1.7 on 11/14 with GFR 47.6 at that time.  Plan:  I told him to ask his renal doctor, who he has a follow-up with in the near future) about it and if protein should be restricted to 95 grams or less and also what water restrictions he should follow, if any.   These questions were written down and handed to him for him to ask Renal team.  Lab Results  Component Value Date   CREATININE 2.68 (H) 06/04/2020   CREATININE 2.8 (A) 05/13/2020   CREATININE 2.46 (H) 12/06/2019   Lab Results  Component Value Date   CREATININE 2.68 (H) 06/04/2020   BUN 26 (H) 06/04/2020   NA 140 06/04/2020   K 4.2 06/04/2020   CL 105 06/04/2020   CO2 29 06/04/2020    6. OSA on CPAP Discussed labs with patient today.    Frederick Fox has a diagnosis of sleep apnea. He reports that he is using a CPAP regularly.  He saw Dr. Elsworth Soho of Pulmonology last on 07/13/2019.  He was initially diagnosed on 01/2016.  Plan:  He was told to follow-up in 1 year with his sleep medicine doctor.  He is due for a follow-up appointment- told him to call for this.  Continue CPAP per Sleep Medicine.  Important to have nightly compliance. Education done    7. At risk for heart disease Due to Frederick Fox's current state of health and medical condition(s), he is at a higher risk for heart disease.   This puts the patient at much greater risk to subsequently develop  cardiopulmonary conditions that can significantly affect patient's quality of life in a negative manner as well.    At least 30+ minutes was spent on counseling Azaan about these concerns today and I stressed the importance of reversing risks factors of obesity, esp truncal and visceral fat, hypertension, hyperlipidemia, pre-diabetes.   Initial goal is to lose at least 5-10% of starting weight to help reduce these risk factors.   Counseling: Intensive lifestyle modifications discussed  with Krish as most appropriate first line treatment.  he will continue to work on diet, exercise and weight loss efforts.  We will continue to reassess these conditions on a fairly regular basis in an attempt to decrease patient's overall morbidity and mortality.  Evidence-based interventions for health behavior change were utilized today including the discussion of self monitoring techniques, problem-solving barriers and SMART goal setting techniques.  Specifically regarding patient's less desirable eating habits and patterns, we employed the technique of small changes when Daegon has not been able to fully commit to his prudent nutritional plan.  8. Class 2 severe obesity with serious comorbidity and body mass index (BMI) of 38.0 to 38.9 in adult, unspecified obesity type ([name]  Daegon is currently in the action stage of change. As such, his goal is to continue with weight loss efforts. He has agreed to the Category 3 Plan.   Exercise goals: All adults should avoid inactivity. Some physical activity is better than none, and adults who participate in any amount of physical activity gain some health benefits.  Start walking 10 minutes per day.  Behavioral modification strategies: decreasing simple carbohydrates, increasing water intake from 50 to 80-100 ounces per day, decreasing sodium intake, meal planning and cooking strategies and planning for success.  Recardo has agreed to follow-up with our clinic in 2 weeks. He  was informed of the importance of frequent follow-up visits to maximize his success with intensive lifestyle modifications for his multiple health conditions.   Objective:   Blood pressure 106/68, pulse 77, temperature 98.5 F (36.9 C), height 5\' 9"  (1.753 m), weight 257 lb (116.6 kg), SpO2 97 %. Body mass index is 37.95 kg/m.  General: Cooperative, alert, well developed, in no acute distress. HEENT: Conjunctivae and lids unremarkable. Cardiovascular: Regular rhythm.  Lungs: Normal work of breathing. Neurologic: No focal deficits.   Lab Results  Component Value Date   CREATININE 2.68 (H) 06/04/2020   BUN 26 (H) 06/04/2020   NA 140 06/04/2020   K 4.2 06/04/2020   CL 105 06/04/2020   CO2 29 06/04/2020   Lab Results  Component Value Date   ALT 22 06/04/2020   AST 22 06/04/2020   ALKPHOS 86 12/06/2019   BILITOT 0.3 06/04/2020   Lab Results  Component Value Date   HGBA1C 6.0 (H) 06/04/2020   HGBA1C 6.1 12/06/2019   HGBA1C 6.4 07/14/2019   HGBA1C 6.7 (H) 12/23/2018   HGBA1C 6.6 (H) 10/24/2018   Lab Results  Component Value Date   INSULIN 42.0 (H) 08/06/2020   Lab Results  Component Value Date   TSH 1.060 08/06/2020   Lab Results  Component Value Date   CHOL 201 (H) 06/04/2020   HDL 38 (L) 06/04/2020   LDLCALC 111 (H) 06/04/2020   LDLDIRECT 121.0 07/14/2019   TRIG 365 (H) 06/04/2020   CHOLHDL 5.3 (H) 06/04/2020   Lab Results  Component Value Date   WBC 9.4 06/04/2020   HGB 13.8 06/04/2020   HCT 41.8 06/04/2020   MCV 84.4 06/04/2020   PLT 210 06/04/2020   Attestation Statements:   Reviewed by clinician on day of visit: allergies, medications, problem list, medical history, surgical history, family history, social history, and previous encounter notes.  I, Water quality scientist, CMA, am acting as Location manager for [employer], DO.  I have reviewed the above documentation for accuracy and completeness, and I agree with the above. Marjory Sneddon,  D.O.  The [place] was signed into law in  2016 which includes the topic of electronic health records.  This provides immediate access to information in MyChart.  This includes consultation notes, operative notes, office notes, lab results and pathology reports.  If you have any questions about what you read please let us know at your next visit so we can discuss your concerns and take corrective action if need be.  We are right here with you.

## 2020-08-22 ENCOUNTER — Other Ambulatory Visit (INDEPENDENT_AMBULATORY_CARE_PROVIDER_SITE_OTHER): Payer: Self-pay

## 2020-09-02 ENCOUNTER — Other Ambulatory Visit: Payer: Self-pay

## 2020-09-02 ENCOUNTER — Ambulatory Visit (INDEPENDENT_AMBULATORY_CARE_PROVIDER_SITE_OTHER): Payer: BC Managed Care – PPO | Admitting: Family Medicine

## 2020-09-02 ENCOUNTER — Encounter (INDEPENDENT_AMBULATORY_CARE_PROVIDER_SITE_OTHER): Payer: Self-pay | Admitting: Family Medicine

## 2020-09-02 VITALS — BP 116/71 | HR 61 | Temp 98.4°F | Ht 69.0 in | Wt 253.0 lb

## 2020-09-02 DIAGNOSIS — E1169 Type 2 diabetes mellitus with other specified complication: Secondary | ICD-10-CM | POA: Diagnosis not present

## 2020-09-02 DIAGNOSIS — N1831 Chronic kidney disease, stage 3a: Secondary | ICD-10-CM | POA: Diagnosis not present

## 2020-09-02 DIAGNOSIS — E559 Vitamin D deficiency, unspecified: Secondary | ICD-10-CM

## 2020-09-02 DIAGNOSIS — I152 Hypertension secondary to endocrine disorders: Secondary | ICD-10-CM

## 2020-09-02 DIAGNOSIS — E1159 Type 2 diabetes mellitus with other circulatory complications: Secondary | ICD-10-CM

## 2020-09-03 ENCOUNTER — Encounter: Payer: Self-pay | Admitting: Internal Medicine

## 2020-09-03 ENCOUNTER — Encounter (INDEPENDENT_AMBULATORY_CARE_PROVIDER_SITE_OTHER): Payer: Self-pay | Admitting: Family Medicine

## 2020-09-03 DIAGNOSIS — E1169 Type 2 diabetes mellitus with other specified complication: Secondary | ICD-10-CM

## 2020-09-03 MED ORDER — BLOOD GLUCOSE MONITOR KIT: PACK | 0 refills | Status: DC

## 2020-09-09 LAB — CBC AND DIFFERENTIAL
HCT: 42 (ref 41–53)
Hemoglobin: 13.8 (ref 13.5–17.5)
Neutrophils Absolute: 5.8
Platelets: 225 (ref 150–399)
WBC: 8.9

## 2020-09-09 LAB — BASIC METABOLIC PANEL
BUN: 59 — AB (ref 4–21)
CO2: 22 (ref 13–22)
Chloride: 105 (ref 99–108)
Creatinine: 2.9 — AB (ref 0.6–1.3)
Glucose: 125
Potassium: 4.8 (ref 3.4–5.3)
Sodium: 140 (ref 137–147)

## 2020-09-09 LAB — IRON,TIBC AND FERRITIN PANEL
%SAT: 12
Ferritin: 59
Iron: 33
TIBC: 276
UIBC: 243

## 2020-09-09 LAB — COMPREHENSIVE METABOLIC PANEL
Albumin: 4 (ref 3.5–5.0)
Calcium: 9.2 (ref 8.7–10.7)
GFR calc Af Amer: 27
GFR calc non Af Amer: 23

## 2020-09-09 LAB — VITAMIN D 25 HYDROXY (VIT D DEFICIENCY, FRACTURES): Vit D, 25-Hydroxy: 41

## 2020-09-09 LAB — CBC: RBC: 4.99 (ref 3.87–5.11)

## 2020-09-09 NOTE — Progress Notes (Signed)
Chief Complaint:   OBESITY Frederick Fox is here to discuss his progress with his obesity treatment plan along with follow-up of his obesity related diagnoses. Frederick Fox is on the Category 3 Plan and states he is following his eating plan approximately 100% of the time. Frederick Fox states he is exercising on the stationary bike 30 minutes 3 times per week.  Today's visit was #: 3 Starting weight: 256 lbs Starting date: 08/06/2020 Today's weight: 253 lbs Today's date: 09/02/2020 Total lbs lost to date: 3 lbs Total lbs lost since last in-office visit: 4 lbs Total weight loss percentage to date: -1.17%  Interim History: Frederick Fox is biking 30 minutes 3 day a week. Meal plan is going well. He has no hunger or cravings. His snacks include pretzels and halo ice pop (40-90 cal per pop). Frederick Fox is drinking 80 oz of water a day. [place]. He is not worried, as he has been meal planning already with his wife and doesn't plan on "blowing it."  Assessment/Plan:   1. Stage 3a chronic kidney disease (Frederick Fox) Frederick Fox sees [facility] on 09/09/2020 and will ask if he needs to be on protein restriction.  Plan: Frederick Fox will bring in nephrology/renal documentation of recent labs and office visit, if possible. Management, per nephrology. Serum creatinine obtained about renal after 1 month or so, per patient.  2. Hypertension associated with type 2 diabetes mellitus (Frederick Fox) Frederick Fox denies headaches. He has a way to check his blood pressure at home now. Everyday his blood pressures are 110/68, 100/60, 120/70. He is on ARB.  Plan: Frederick Fox will continue all blood pressure medications. His blood pressure is at goal.  3. Type 2 diabetes mellitus with other specified complication, without long-term current use of insulin ([name] Medications reviewed.   Lab Results  Component Value Date   HGBA1C 6.0 (H) 06/04/2020   HGBA1C 6.1 12/06/2019   HGBA1C 6.4 07/14/2019   Lab Results  Component  Value Date   MICROALBUR 251.7 (H) 10/09/2008   LDLCALC 111 (H) 06/04/2020   CREATININE 2.68 (H) 06/04/2020   Lab Results  Component Value Date   INSULIN 42.0 (H) 08/06/2020   Plan: Check A1c at next office visit.  - Hemoglobin A1c  4. Vitamin D deficiency Frederick Fox's Vitamin D level was 26 on 06/04/2020. He is currently taking prescription vitamin D 50,000 IU each week. He denies nausea, vomiting or muscle weakness.  Plan: Continue prescription Vit D. Low Vitamin D level contributes to fatigue and are associated with obesity, breast, and colon cancer. He agrees to continue to take prescription Vitamin D @50 ,000 IU every week and will follow-up for routine testing of Vitamin D, at least 2-3 times per year to avoid over-replacement.  5. Class 2 severe obesity with serious comorbidity and body mass index (BMI) of 37.0 to 37.9 in adult, unspecified obesity type ([name] Frederick Fox is currently in the action stage of change. As such, his goal is to continue with weight loss efforts. He has agreed to the Category 3 Plan.   Exercise goals: Increase exercise to 150 minutes weekly. For substantial health benefits, adults should do at least 150 minutes (2 hours and 30 minutes) a week of moderate-intensity, or 75 minutes (1 hour and 15 minutes) a week of vigorous-intensity aerobic physical activity, or an equivalent combination of moderate- and vigorous-intensity aerobic activity. Aerobic activity should be performed in episodes of at least 10 minutes, and preferably, it should be spread throughout the week.  Behavioral  modification strategies: increasing lean protein intake, decreasing simple carbohydrates, meal planning and cooking strategies, holiday eating strategies  and celebration eating strategies.  Frederick Fox has agreed to follow-up with our clinic in 2 weeks. At his next office visit, we will check Frederick Fox's A1c and BMP with GFR, if no renal notes or recent labs have been done. He was informed of the  importance of frequent follow-up visits to maximize his success with intensive lifestyle modifications for his multiple health conditions.   Objective:   Blood pressure 116/71, pulse 61, temperature 98.4 F (36.9 C), height 5\' 9"  (1.753 m), weight 253 lb (114.8 kg), SpO2 98 %. Body mass index is 37.36 kg/m.  General: Cooperative, alert, well developed, in no acute distress. HEENT: Conjunctivae and lids unremarkable. Cardiovascular: Regular rhythm.  Lungs: Normal work of breathing. Neurologic: No focal deficits.   Lab Results  Component Value Date   CREATININE 2.68 (H) 06/04/2020   BUN 26 (H) 06/04/2020   NA 140 06/04/2020   K 4.2 06/04/2020   CL 105 06/04/2020   CO2 29 06/04/2020   Lab Results  Component Value Date   ALT 22 06/04/2020   AST 22 06/04/2020   ALKPHOS 86 12/06/2019   BILITOT 0.3 06/04/2020   Lab Results  Component Value Date   HGBA1C 6.0 (H) 06/04/2020   HGBA1C 6.1 12/06/2019   HGBA1C 6.4 07/14/2019   HGBA1C 6.7 (H) 12/23/2018   HGBA1C 6.6 (H) 10/24/2018   Lab Results  Component Value Date   INSULIN 42.0 (H) 08/06/2020   Lab Results  Component Value Date   TSH 1.060 08/06/2020   Lab Results  Component Value Date   CHOL 201 (H) 06/04/2020   HDL 38 (L) 06/04/2020   LDLCALC 111 (H) 06/04/2020   LDLDIRECT 121.0 07/14/2019   TRIG 365 (H) 06/04/2020   CHOLHDL 5.3 (H) 06/04/2020   Lab Results  Component Value Date   WBC 9.4 06/04/2020   HGB 13.8 06/04/2020   HCT 41.8 06/04/2020   MCV 84.4 06/04/2020   PLT 210 06/04/2020    Attestation Statements:   Reviewed by clinician on day of visit: allergies, medications, problem list, medical history, surgical history, family history, social history, and previous encounter notes.  Time spent on visit including pre-visit chart review and post-visit care and charting was 30 minutes.   Coral Ceo, am acting as Location manager for [employer], DO.  I have reviewed the above documentation  for accuracy and completeness, and I agree with the above. Marjory Sneddon, D.O.  The [place] was signed into law in 2016 which includes the topic of electronic health records.  This provides immediate access to information in MyChart.  This includes consultation notes, operative notes, office notes, lab results and pathology reports.  If you have any questions about what you read please let us know at your next visit so we can discuss your concerns and take corrective action if need be.  We are right here with you.

## 2020-09-14 ENCOUNTER — Encounter: Payer: Self-pay | Admitting: Internal Medicine

## 2020-09-14 ENCOUNTER — Other Ambulatory Visit: Payer: Self-pay | Admitting: Internal Medicine

## 2020-09-14 MED ORDER — LOSARTAN POTASSIUM 100 MG PO TABS
100.0000 mg | ORAL_TABLET | Freq: Every day | ORAL | 2 refills | Status: DC
Start: 2020-09-14 — End: 2021-09-22

## 2020-09-16 ENCOUNTER — Ambulatory Visit (INDEPENDENT_AMBULATORY_CARE_PROVIDER_SITE_OTHER): Payer: BC Managed Care – PPO | Admitting: Family Medicine

## 2020-09-16 ENCOUNTER — Encounter (INDEPENDENT_AMBULATORY_CARE_PROVIDER_SITE_OTHER): Payer: Self-pay | Admitting: Family Medicine

## 2020-09-16 ENCOUNTER — Other Ambulatory Visit: Payer: Self-pay

## 2020-09-16 VITALS — BP 129/71 | HR 72 | Temp 98.6°F | Ht 69.0 in | Wt 250.0 lb

## 2020-09-16 DIAGNOSIS — N1831 Chronic kidney disease, stage 3a: Secondary | ICD-10-CM

## 2020-09-16 DIAGNOSIS — Z9189 Other specified personal risk factors, not elsewhere classified: Secondary | ICD-10-CM | POA: Insufficient documentation

## 2020-09-16 DIAGNOSIS — E1169 Type 2 diabetes mellitus with other specified complication: Secondary | ICD-10-CM | POA: Diagnosis not present

## 2020-09-16 DIAGNOSIS — E559 Vitamin D deficiency, unspecified: Secondary | ICD-10-CM | POA: Diagnosis not present

## 2020-09-16 DIAGNOSIS — E1159 Type 2 diabetes mellitus with other circulatory complications: Secondary | ICD-10-CM

## 2020-09-16 DIAGNOSIS — Z6837 Body mass index (BMI) 37.0-37.9, adult: Secondary | ICD-10-CM

## 2020-09-16 DIAGNOSIS — I152 Hypertension secondary to endocrine disorders: Secondary | ICD-10-CM

## 2020-09-16 MED ORDER — VITAMIN D (ERGOCALCIFEROL) 1.25 MG (50000 UNIT) PO CAPS: 50000.0000 [IU] | ORAL_CAPSULE | ORAL | 0 refills | Status: DC

## 2020-09-17 NOTE — Progress Notes (Signed)
Chief Complaint:   OBESITY Frederick Fox is here to discuss his progress with his obesity treatment plan along with follow-up of his obesity related diagnoses. Frederick Fox is on the Category 3 Plan and states he is following his eating plan approximately 100% of the time. Frederick Fox states he is doing the stationary bike 30 minutes 4-5 times per week.  Today's visit was #: 4 Starting weight: 256 lbs Starting date: 08/06/2020 Today's weight: 250 lbs Today's date: 09/16/2020 Total lbs lost to date: 250 lbs Total lbs lost since last in-office visit: 3 lbs Total weight loss percentage to date: -2.34%  Interim History: Frederick Fox increased his exercise from 3 days a week at 20 minutes to 5 days a week at 30 minutes. He is only hungry when he wakes up.  Assessment/Plan:   1. Stage 3a chronic kidney disease ([place]) Frederick Fox spoke with nephrology and reports no need for a change in the plan, but he is to drink no more than 80 oz of water a day.  Plan: Continue current treatment plan, per nephrology. Creatinine is stable. Obtain labs.  2. Type 2 diabetes mellitus with other specified complication, without long-term current use of insulin ([name] Frederick Fox is prescribed Rybelsus 14 mg. He is checking his blood sugars at home now. He notes a high of 159 and low of 99, and is averaging around 109 with fasting blood sugar and 2 hours post-prandial. Medications reviewed.  Lab Results  Component Value Date   HGBA1C 6.0 (H) 06/04/2020   HGBA1C 6.1 12/06/2019   HGBA1C 6.4 07/14/2019   Lab Results  Component Value Date   MICROALBUR 251.7 (H) 10/09/2008   LDLCALC 111 (H) 06/04/2020   CREATININE 2.9 (A) 09/09/2020   Lab Results  Component Value Date   INSULIN 42.0 (H) 08/06/2020   Plan: Good blood sugar control is important to decrease the likelihood of diabetic complications such as nephropathy, neuropathy, limb loss, blindness, coronary artery disease, and death. Intensive lifestyle modification including diet,  exercise and weight loss are the first line of treatment for diabetes.   3. Hypertension associated with type 2 diabetes mellitus ([place]) Frederick Fox is prescribed Norvasc 10 mg, losartan 921 mg, and Bystolic 5 mg. His reports that his blood pressure is well controlled at home; readings are about what they are in the clinic.  Review: taking medications as instructed, no medication side effects noted, no chest pain on exertion, no dyspnea on exertion, no swelling of ankles.   BP Readings from Last 3 Encounters:  09/16/20 129/71  09/02/20 116/71  08/20/20 106/68   Plan: Continue current treatment plan, per PCP. Frederick Fox is working on healthy weight loss and exercise to improve blood pressure control. We will watch for signs of hypotension as he continues his lifestyle modifications.  4. Vitamin D deficiency Frederick Fox Vitamin D level was 26 on 06/04/2020. He is currently taking prescription vitamin D 50,000 IU each week. He denies nausea, vomiting or muscle weakness.  Plan: Refill Vit D for 1 month, as per below. Low Vitamin D level contributes to fatigue and are associated with obesity, breast, and colon cancer. He agrees to continue to take prescription Vitamin D @50 ,000 IU every week and will follow-up for routine testing of Vitamin D, at least 2-3 times per year to avoid over-replacement.  Refill- Vitamin D, Ergocalciferol, (DRISDOL) 1.25 MG (50000 UNIT) CAPS capsule; Take 1 capsule (50,000 Units total) by mouth every 7 (seven) days.  Dispense: 4 capsule; Refill: 0  5. At risk for hypoglycemia  Frederick Fox was given approximately 15 minutes of counseling today regarding prevention of hypoglycemia. He was advised of symptoms of hypoglycemia. Frederick Fox was instructed to avoid skipping meals, and to eat regular protein-rich meals as prescribed on the meal plan.  Have readily available- low calorie snacks as needed ie- [employer] fruit snack packs if BS goes too low.   6. Class 2 severe obesity with serious comorbidity  and body mass index (BMI) of 37.0 to 37.9 in adult, unspecified obesity type ([name] Frederick Fox is currently in the action stage of change. As such, his goal is to continue with weight loss efforts. He has agreed to the Category 3 Plan.   Exercise goals: As is  Behavioral modification strategies: increasing lean protein intake, decreasing simple carbohydrates, meal planning and cooking strategies and planning for success.  Frederick Fox has agreed to follow-up with our clinic in 4 weeks. He was informed of the importance of frequent follow-up visits to maximize his success with intensive lifestyle modifications for his multiple health conditions.   Objective:   Blood pressure 129/71, pulse 72, temperature 98.6 F (37 C), height 5\' 9"  (1.753 m), weight 250 lb (113.4 kg), SpO2 96 %. Body mass index is 36.92 kg/m.  General: Cooperative, alert, well developed, in no acute distress. HEENT: Conjunctivae and lids unremarkable. Cardiovascular: Regular rhythm.  Lungs: Normal work of breathing. Neurologic: No focal deficits.   Lab Results  Component Value Date   CREATININE 2.9 (A) 09/09/2020   BUN 59 (A) 09/09/2020   NA 140 09/09/2020   K 4.8 09/09/2020   CL 105 09/09/2020   CO2 22 09/09/2020   Lab Results  Component Value Date   ALT 22 06/04/2020   AST 22 06/04/2020   ALKPHOS 86 12/06/2019   BILITOT 0.3 06/04/2020   Lab Results  Component Value Date   HGBA1C 6.0 (H) 06/04/2020   HGBA1C 6.1 12/06/2019   HGBA1C 6.4 07/14/2019   HGBA1C 6.7 (H) 12/23/2018   HGBA1C 6.6 (H) 10/24/2018   Lab Results  Component Value Date   INSULIN 42.0 (H) 08/06/2020   Lab Results  Component Value Date   TSH 1.060 08/06/2020   Lab Results  Component Value Date   CHOL 201 (H) 06/04/2020   HDL 38 (L) 06/04/2020   LDLCALC 111 (H) 06/04/2020   LDLDIRECT 121.0 07/14/2019   TRIG 365 (H) 06/04/2020   CHOLHDL 5.3 (H) 06/04/2020   Lab Results  Component Value Date   WBC 8.9 09/09/2020   HGB 13.8  09/09/2020   HCT 42 09/09/2020   MCV 84.4 06/04/2020   PLT 225 09/09/2020    Attestation Statements:   Reviewed by clinician on day of visit: allergies, medications, problem list, medical history, surgical history, family history, social history, and previous encounter notes.  Coral Ceo, am acting as Location manager for [employer], DO.  I have reviewed the above documentation for accuracy and completeness, and I agree with the above. Marjory Sneddon, D.O.  The [place] was signed into law in 2016 which includes the topic of electronic health records.  This provides immediate access to information in MyChart.  This includes consultation notes, operative notes, office notes, lab results and pathology reports.  If you have any questions about what you read please let us know at your next visit so we can discuss your concerns and take corrective action if need be.  We are right here with you.

## 2020-09-19 ENCOUNTER — Other Ambulatory Visit (INDEPENDENT_AMBULATORY_CARE_PROVIDER_SITE_OTHER): Payer: Self-pay

## 2020-10-14 ENCOUNTER — Other Ambulatory Visit: Payer: Self-pay

## 2020-10-14 ENCOUNTER — Encounter (INDEPENDENT_AMBULATORY_CARE_PROVIDER_SITE_OTHER): Payer: Self-pay | Admitting: Family Medicine

## 2020-10-14 ENCOUNTER — Encounter: Payer: Self-pay | Admitting: Internal Medicine

## 2020-10-14 ENCOUNTER — Ambulatory Visit (INDEPENDENT_AMBULATORY_CARE_PROVIDER_SITE_OTHER): Payer: BC Managed Care – PPO | Admitting: Family Medicine

## 2020-10-14 VITALS — BP 119/69 | HR 68 | Temp 98.3°F | Ht 69.0 in | Wt 246.0 lb

## 2020-10-14 DIAGNOSIS — E559 Vitamin D deficiency, unspecified: Secondary | ICD-10-CM

## 2020-10-14 DIAGNOSIS — F39 Unspecified mood [affective] disorder: Secondary | ICD-10-CM

## 2020-10-14 DIAGNOSIS — E1159 Type 2 diabetes mellitus with other circulatory complications: Secondary | ICD-10-CM

## 2020-10-14 DIAGNOSIS — Z6836 Body mass index (BMI) 36.0-36.9, adult: Secondary | ICD-10-CM

## 2020-10-14 DIAGNOSIS — Z9189 Other specified personal risk factors, not elsewhere classified: Secondary | ICD-10-CM

## 2020-10-14 DIAGNOSIS — E1169 Type 2 diabetes mellitus with other specified complication: Secondary | ICD-10-CM | POA: Diagnosis not present

## 2020-10-14 DIAGNOSIS — I152 Hypertension secondary to endocrine disorders: Secondary | ICD-10-CM

## 2020-10-14 MED ORDER — VITAMIN D (ERGOCALCIFEROL) 1.25 MG (50000 UNIT) PO CAPS: 50000.0000 [IU] | ORAL_CAPSULE | ORAL | 0 refills | Status: DC

## 2020-10-14 NOTE — Progress Notes (Signed)
Chief Complaint:   OBESITY Frederick Fox is here to discuss his progress with his obesity treatment plan along with follow-up of his obesity related diagnoses. Frederick Fox is on the Category 3 Plan and states he is following his eating plan approximately 100% of the time. Frederick Fox states he is using the stationary bike 30 minutes 3 times per week.  Today's visit was #: 5 Starting weight: 256 lbs Starting date: 08/06/2020 Today's weight: 246 lbs Today's date: 10/14/2020 Total lbs lost to date: 10 lbs Total lbs lost since last in-office visit: 4 lbs Total weight loss percentage to date: -3.91%  Interim History: Frederick Fox reports that he used PC/[place] over the holidays. He followed the plan 100% of the time, except for the holiday dinners and some holiday dessert when he did PC.  Working well for him, no concerns with meal plan.  When follows it, hunger and cravings- controlled.    Assessment/Plan:   1. HTN assoc with DM Frederick Fox reports his at home blood pressure runs around 110/80, especially since starting Bystolic. He is prescribed Norvasc, Cozaar, Bystolic, per Nephrology.   Plan: [name]'s blood pressure is at goal. Continue prudent nutritional plan and weight loss. Continue current treatment plan, per nephrology and PCP and we'll continue to closely monitor as patient loses weight.     2. Vitamin D deficiency Frederick Fox's Vitamin D level was 26 on 06/04/2020. He is currently taking prescription vitamin D 50,000 IU each week. He denies nausea, vomiting or muscle weakness.  Ref. Range 06/04/2020 16:49  Vitamin D, 25-Hydroxy Latest Ref Range: 30 - 100 ng/mL 26 (L)   Plan:  - Reiterated importance of vitamin D (as well as calcium) to their health and wellbeing.  - Reminded Frederick Fox that weight loss will likely improve availability of vitamin D, thus encouraged him to continue with meal plan and their weight loss efforts to further improve this condition. - I recommend patient continue to take  weekly prescription vit D 50,000 IU - Informed patient this may be a lifelong thing, and he was encouraged to continue to take the medicine until told otherwise.   - we will need to monitor levels regularly (every 3-4 mo on average) to keep levels within normal limits.  - weight loss will likely improve availability of vitamin D, thus encouraged Frederick Fox to continue with meal plan and their weight loss efforts to further improve this condition - pt's questions and concerns regarding this condition addressed.  Refill- Vitamin D, Ergocalciferol, (DRISDOL) 1.25 MG (50000 UNIT) CAPS capsule; Take 1 capsule (50,000 Units total) by mouth every 7 (seven) days.  Dispense: 4 capsule; Refill: 0   3. Type 2 diabetes mellitus with other specified complication, without long-term current use of insulin ([name] Frederick Fox reports blood sugar at home is usually 100-110's but had a high of 159. His last A1c was 6.0 approximately 4 months ago. Frederick Fox is prescribed Rybelsus.  Lab Results  Component Value Date   HGBA1C 6.0 (H) 06/04/2020   HGBA1C 6.1 12/06/2019   HGBA1C 6.4 07/14/2019   Lab Results  Component Value Date   MICROALBUR 251.7 (H) 10/09/2008   LDLCALC 111 (H) 06/04/2020   CREATININE 2.9 (A) 09/09/2020   Lab Results  Component Value Date   INSULIN 42.0 (H) 08/06/2020   Plan: [name] will continue Rybelsus. Continue home blood pressure monitoring. We will check A1c today.  Orders - Hemoglobin A1c   4. Mood disorder (Frederick Fox), with emotional eating Emotional eating was an issue in the  past but [name] is doing very well and denies problems currently.   Plan: Frederick Fox will continue Lexapro 20 mg daily. He denies sexual side effects and feels his daily supplements (Clomid) help with that. Frederick Fox feels that Rybelsus really helps with emotional eating and appetite control.    5. At risk for heart disease Due to Frederick Fox's current state of health and medical condition(s), he is at a higher risk for heart  disease.   This puts the patient at much greater risk to subsequently develop cardiopulmonary conditions that can significantly affect patient's quality of life in a negative manner as well.    At least 11 minutes was spent on counseling [name] about these concerns today and I stressed the importance of reversing risks factors of obesity, esp truncal and visceral fat, hypertension, hyperlipidemia, pre-diabetes.   Initial goal is to lose at least 5-10% of starting weight to help reduce these risk factors.   Counseling: Intensive lifestyle modifications discussed with [name] as most appropriate first line treatment.  he will continue to work on diet, exercise and weight loss efforts.  We will continue to reassess these conditions on a fairly regular basis in an attempt to decrease patient's overall morbidity and mortality.  Evidence-based interventions for health behavior change were utilized today including the discussion of self monitoring techniques, problem-solving barriers and SMART goal setting techniques.  Specifically regarding patient's less desirable eating habits and patterns, we employed the technique of small changes when [name] has not been able to fully commit to his prudent nutritional plan.   6. Class 2 severe obesity with serious comorbidity and body mass index (BMI) of 36.0 to 36.9 in adult, unspecified obesity type ([name] Frederick Fox is currently in the action stage of change. As such, his goal is to continue with weight loss efforts. He has agreed to the Category 3 Plan.   Exercise goals: For substantial health benefits, adults should do at least 150 minutes (2 hours and 30 minutes) a week of moderate-intensity, or 75 minutes (1 hour and 15 minutes) a week of vigorous-intensity aerobic physical activity, or an equivalent combination of moderate- and vigorous-intensity aerobic activity. Aerobic activity should be performed in episodes of at least 10 minutes, and preferably, it should be spread  throughout the week.  Behavioral modification strategies: meal planning and cooking strategies, emotional eating strategies and planning for success.  Frederick Fox has agreed to follow-up with our clinic in 2-3 weeks. He was informed of the importance of frequent follow-up visits to maximize his success with intensive lifestyle modifications for his multiple health conditions.   Frederick Fox was informed we would discuss his lab results at his next visit unless there is a critical issue that needs to be addressed sooner. Frederick Fox agreed to keep his next visit at the agreed upon time to discuss these results.   Objective:   Blood pressure 119/69, pulse 68, temperature 98.3 F (36.8 C), height 5\' 9"  (1.753 m), weight 246 lb (111.6 kg), SpO2 97 %. Body mass index is 36.33 kg/m.  General: Cooperative, alert, well developed, in no acute distress. HEENT: Conjunctivae and lids unremarkable. Cardiovascular: Regular rhythm.  Lungs: Normal work of breathing. Neurologic: No focal deficits.   Lab Results  Component Value Date   CREATININE 2.9 (A) 09/09/2020   BUN 59 (A) 09/09/2020   NA 140 09/09/2020   K 4.8 09/09/2020   CL 105 09/09/2020   CO2 22 09/09/2020   Lab Results  Component Value Date   ALT 22 06/04/2020  AST 22 06/04/2020   ALKPHOS 86 12/06/2019   BILITOT 0.3 06/04/2020   Lab Results  Component Value Date   HGBA1C 6.0 (H) 06/04/2020   HGBA1C 6.1 12/06/2019   HGBA1C 6.4 07/14/2019   HGBA1C 6.7 (H) 12/23/2018   HGBA1C 6.6 (H) 10/24/2018   Lab Results  Component Value Date   INSULIN 42.0 (H) 08/06/2020   Lab Results  Component Value Date   TSH 1.060 08/06/2020   Lab Results  Component Value Date   CHOL 201 (H) 06/04/2020   HDL 38 (L) 06/04/2020   LDLCALC 111 (H) 06/04/2020   LDLDIRECT 121.0 07/14/2019   TRIG 365 (H) 06/04/2020   CHOLHDL 5.3 (H) 06/04/2020   Lab Results  Component Value Date   WBC 8.9 09/09/2020   HGB 13.8 09/09/2020   HCT 42 09/09/2020   MCV 84.4  06/04/2020   PLT 225 09/09/2020   Lab Results  Component Value Date   IRON 33 09/09/2020   TIBC 276 09/09/2020   FERRITIN 59 09/09/2020    Attestation Statements:   Reviewed by clinician on day of visit: allergies, medications, problem list, medical history, surgical history, family history, social history, and previous encounter notes.  Coral Ceo, am acting as Location manager for [employer], DO.  I have reviewed the above documentation for accuracy and completeness, and I agree with the above. Marjory Sneddon, D.O.  The [place] was signed into law in 2016 which includes the topic of electronic health records.  This provides immediate access to information in MyChart.  This includes consultation notes, operative notes, office notes, lab results and pathology reports.  If you have any questions about what you read please let us know at your next visit so we can discuss your concerns and take corrective action if need be.  We are right here with you.

## 2020-10-15 LAB — HEMOGLOBIN A1C
Est. average glucose Bld gHb Est-mCnc: 117 mg/dL
Hgb A1c MFr Bld: 5.7 % — ABNORMAL HIGH (ref 4.8–5.6)

## 2020-10-15 MED ORDER — ALBUTEROL SULFATE HFA 108 (90 BASE) MCG/ACT IN AERS: 2.0000 | INHALATION_SPRAY | Freq: Four times a day (QID) | RESPIRATORY_TRACT | 5 refills | Status: AC | PRN

## 2020-10-17 MED ORDER — BUDESONIDE-FORMOTEROL FUMARATE 80-4.5 MCG/ACT IN AERO
2.0000 | INHALATION_SPRAY | Freq: Two times a day (BID) | RESPIRATORY_TRACT | 5 refills | Status: AC
Start: 2020-10-17 — End: ?

## 2020-10-17 NOTE — Addendum Note (Signed)
Addended by: Binnie Rail on: 10/17/2020 09:16 AM   Modules accepted: Orders

## 2020-10-22 ENCOUNTER — Telehealth: Payer: Self-pay | Admitting: Pulmonary Disease

## 2020-10-22 MED ORDER — PREDNISONE 10 MG PO TABS: ORAL_TABLET | ORAL | 0 refills | Status: DC

## 2020-10-22 NOTE — Telephone Encounter (Signed)
Called and spoke with pt who states he began having problems with his asthma 1 week ago. Pt said that he has not used his rescue inhaler but states instead of using his Symbicort inhaler bid as prescribed, he has started using it tid.  Pt has had some wheezing and also has had issues with coughing and chest tightness. Pt has not tried any OTC meds due to his high BP.  Pt denies any complaints of fever, has not checked his temp but states that he does not feel feverish.  Pt wants to know if there is anything we could recommend to help with his symptoms. Dr. Elsworth Soho, please advise.

## 2020-10-22 NOTE — Telephone Encounter (Signed)
Maximum dose on Symbicort is 2 puffs twice daily Prednisone 10 mg tabs  Take 2 tabs daily with food x 5ds, then 1 tab daily with food x 5ds then STOP  If no better, needs office visit with APP/me

## 2020-10-22 NOTE — Telephone Encounter (Signed)
Spoke with the pt and notified of response per Dr Elsworth Soho and he verbalized understanding Rx for pred sent to pharm  Pt to call if not improving

## 2020-10-29 ENCOUNTER — Encounter (INDEPENDENT_AMBULATORY_CARE_PROVIDER_SITE_OTHER): Payer: Self-pay

## 2020-10-30 ENCOUNTER — Encounter (INDEPENDENT_AMBULATORY_CARE_PROVIDER_SITE_OTHER): Payer: Self-pay | Admitting: Family Medicine

## 2020-10-30 ENCOUNTER — Telehealth (INDEPENDENT_AMBULATORY_CARE_PROVIDER_SITE_OTHER): Payer: Self-pay

## 2020-10-30 ENCOUNTER — Other Ambulatory Visit: Payer: Self-pay

## 2020-10-30 ENCOUNTER — Telehealth (INDEPENDENT_AMBULATORY_CARE_PROVIDER_SITE_OTHER): Payer: BC Managed Care – PPO | Admitting: Family Medicine

## 2020-10-30 VITALS — BP 116/76 | Wt 244.0 lb

## 2020-10-30 DIAGNOSIS — E1169 Type 2 diabetes mellitus with other specified complication: Secondary | ICD-10-CM

## 2020-10-30 DIAGNOSIS — Z9189 Other specified personal risk factors, not elsewhere classified: Secondary | ICD-10-CM

## 2020-10-30 DIAGNOSIS — E559 Vitamin D deficiency, unspecified: Secondary | ICD-10-CM

## 2020-10-30 DIAGNOSIS — Z6836 Body mass index (BMI) 36.0-36.9, adult: Secondary | ICD-10-CM

## 2020-10-30 MED ORDER — VITAMIN D (ERGOCALCIFEROL) 1.25 MG (50000 UNIT) PO CAPS
50000.0000 [IU] | ORAL_CAPSULE | ORAL | 0 refills | Status: DC
Start: 2020-10-30 — End: 2020-12-24

## 2020-10-30 NOTE — Telephone Encounter (Signed)
Verbal consent obtained to conduct video visit, via telehealth 

## 2020-11-01 ENCOUNTER — Telehealth: Payer: Self-pay | Admitting: Pulmonary Disease

## 2020-11-01 NOTE — Telephone Encounter (Signed)
Pt called back and he stated that the prednisone has not really helped.  He is having the pain in his throat and chest.  He is trying to with hold the cough while taking the prednisone.  Dry cough only.   Pt is requesting further recs at this time.  RA please advise. Thanks

## 2020-11-01 NOTE — Telephone Encounter (Signed)
I have called and LM on VM for the pt to call us back.  

## 2020-11-02 NOTE — Progress Notes (Signed)
TeleHealth Visit:  Due to the COVID-19 pandemic, this visit was completed with telemedicine (audio/video) technology to reduce patient and provider exposure as well as to preserve personal protective equipment.   Frederick Fox has verbally consented to this TeleHealth visit. The patient is located at home, the provider is located at the [facility] office. The participants in this visit include the listed provider and patient. The visit was conducted today via video.  Chief Complaint: OBESITY Frederick Fox is here to discuss his progress with his obesity treatment plan along with follow-up of his obesity related diagnoses. Frederick Fox is on the Category 3 Plan and states he is following his eating plan approximately 100% of the time. Frederick Fox states he is riding stationary bike 30 minutes 3 times per week.  Today's visit was #: 6 Starting weight: 256 lbs Starting date: 08/06/2020  Interim History: Last OV 10/14/2020. Pt had a recent asthma flare up and started on prednisone 10 mg x 10 days, started 10/22/2020. This increased her hunger a bit, but BS's have remained well controlled. Covid negative. Working closely with his pulmonologist. Pt slowly improving.   Assessment/Plan:   1. Type 2 diabetes mellitus with other specified complication, without long-term current use of insulin (Frederick Fox) Discussed labs with patient today. Pt's FBS lowest 101 and highest 115 at home. Recent A1c 5.7 (prior 6.0, 6.1, 6.4, and 6.7). No issues or concerns. Medications reviewed. Diabetic ROS: no polyuria or polydipsia, no chest pain, dyspnea or TIA's, no numbness, tingling or pain in extremities.   Lab Results  Component Value Date   HGBA1C 5.7 (H) 10/14/2020   HGBA1C 6.0 (H) 06/04/2020   HGBA1C 6.1 12/06/2019   Lab Results  Component Value Date   MICROALBUR 251.7 (H) 10/09/2008   LDLCALC 111 (H) 06/04/2020   CREATININE 2.9 (A) 09/09/2020   Lab Results  Component Value Date   INSULIN 42.0 (H) 08/06/2020    Plan: Continue meds. A1c at goal. Increase exercise and prudent nutritional plan for even better control of blood sugar, blood pressure, etc. Good blood sugar control is important to decrease the likelihood of diabetic complications such as nephropathy, neuropathy, limb loss, blindness, coronary artery disease, and death. Intensive lifestyle modification including diet, exercise and weight loss are the first line of treatment for diabetes.   2. Vitamin D deficiency Frederick Fox's Vitamin D level was 41 on 09/09/2020. He is currently taking prescription vitamin D 50,000 IU each week. He denies nausea, vomiting or muscle weakness.  Ref. Range 09/09/2020 00:00  Vitamin D, 25-Hydroxy Unknown 41.0   Plan: Refill Vit D for 1 month, as per below. Low Vitamin D level contributes to fatigue and are associated with obesity, breast, and colon cancer. He agrees to continue to take prescription Vitamin D @50 ,000 IU every week and will follow-up for routine testing of Vitamin D, at least 2-3 times per year to avoid over-replacement.  Refill- Vitamin D, Ergocalciferol, (DRISDOL) 1.25 MG (50000 UNIT) CAPS capsule; Take 1 capsule (50,000 Units total) by mouth every 7 (seven) days.  Dispense: 4 capsule; Refill: 0  3. At risk for impaired metabolic function Frederick Fox was given approximately 15 minutes of impaired  metabolic function prevention counseling today. We discussed intensive lifestyle modifications today with an emphasis on specific nutrition and exercise instructions and strategies.   4. Class 2 severe obesity with serious comorbidity and body mass index (BMI) of 36.0 to 36.9 in adult, unspecified obesity type ([name] Frederick Fox is currently in the action stage of change. As  such, his goal is to continue with weight loss efforts. He has agreed to the Category 3 Plan.   Exercise goals: Increase to: For substantial health benefits, adults should do at least 150 minutes (2 hours and 30 minutes) a week of  moderate-intensity, or 75 minutes (1 hour and 15 minutes) a week of vigorous-intensity aerobic physical activity, or an equivalent combination of moderate- and vigorous-intensity aerobic activity. Aerobic activity should be performed in episodes of at least 10 minutes, and preferably, it should be spread throughout the week.  Behavioral modification strategies: meal planning and cooking strategies, better snacking choices and planning for success.  Frederick Fox has agreed to follow-up with our clinic in 2-3 weeks on 11/18/2020 at 1520. He was informed of the importance of frequent follow-up visits to maximize his success with intensive lifestyle modifications for his multiple health conditions.  Objective:   VITALS: Per patient if applicable, see vitals. GENERAL: Alert and in no acute distress. CARDIOPULMONARY: No increased WOB. Speaking in clear sentences.  PSYCH: Pleasant and cooperative. Speech normal rate and rhythm. Affect is appropriate. Insight and judgement are appropriate. Attention is focused, linear, and appropriate.  NEURO: Oriented as arrived to appointment on time with no prompting.   Lab Results  Component Value Date   CREATININE 2.9 (A) 09/09/2020   BUN 59 (A) 09/09/2020   NA 140 09/09/2020   K 4.8 09/09/2020   CL 105 09/09/2020   CO2 22 09/09/2020   Lab Results  Component Value Date   ALT 22 06/04/2020   AST 22 06/04/2020   ALKPHOS 86 12/06/2019   BILITOT 0.3 06/04/2020   Lab Results  Component Value Date   HGBA1C 5.7 (H) 10/14/2020   HGBA1C 6.0 (H) 06/04/2020   HGBA1C 6.1 12/06/2019   HGBA1C 6.4 07/14/2019   HGBA1C 6.7 (H) 12/23/2018   Lab Results  Component Value Date   INSULIN 42.0 (H) 08/06/2020   Lab Results  Component Value Date   TSH 1.060 08/06/2020   Lab Results  Component Value Date   CHOL 201 (H) 06/04/2020   HDL 38 (L) 06/04/2020   LDLCALC 111 (H) 06/04/2020   LDLDIRECT 121.0 07/14/2019   TRIG 365 (H) 06/04/2020   CHOLHDL 5.3 (H) 06/04/2020    Lab Results  Component Value Date   WBC 8.9 09/09/2020   HGB 13.8 09/09/2020   HCT 42 09/09/2020   MCV 84.4 06/04/2020   PLT 225 09/09/2020   Lab Results  Component Value Date   IRON 33 09/09/2020   TIBC 276 09/09/2020   FERRITIN 59 09/09/2020    Attestation Statements:   Reviewed by clinician on day of visit: allergies, medications, problem list, medical history, surgical history, family history, social history, and previous encounter notes.  Coral Ceo, am acting as Location manager for [employer], DO.  I have reviewed the above documentation for accuracy and completeness, and I agree with the above. Marjory Sneddon, D.O.  [place] was signed into law in 2016 which includes the topic of electronic health records.  This provides immediate access to information in MyChart.  This includes consultation notes, operative notes, office notes, lab results and pathology reports.  If you have any questions about what you read please let us know at your next visit so we can discuss your concerns and take corrective action if need be.  We are right here with you.

## 2020-11-04 NOTE — Telephone Encounter (Signed)
Tried calling the pt and had to LMTCB. 

## 2020-11-04 NOTE — Telephone Encounter (Signed)
Called let patient know we contacted Dr. Elsworth Soho again Patient voiced understanding let him know we would call once we heard back from Dr. Elsworth Soho  Dr. Elsworth Soho please advise.

## 2020-11-04 NOTE — Telephone Encounter (Signed)
If no better, needs office visit with APP/me

## 2020-11-04 NOTE — Telephone Encounter (Signed)
Tried calling the pt back- still no answer and no VM picked up this time. WCB.   IF PT CALLS BACK PLEASE LET HIM KNOW DR ALVA WANTS TO HAVE HIM COME IN FOR APPT- PLEASE SCHEDULE WITH APP.

## 2020-11-04 NOTE — Telephone Encounter (Signed)
Tried calling the pt and again, no answer so LMTCB   MAKE HIM APPT WITH APP WHEN CALLS BACK PLEASE.

## 2020-11-05 ENCOUNTER — Ambulatory Visit: Payer: Self-pay

## 2020-11-05 ENCOUNTER — Ambulatory Visit (INDEPENDENT_AMBULATORY_CARE_PROVIDER_SITE_OTHER): Payer: BC Managed Care – PPO

## 2020-11-05 ENCOUNTER — Other Ambulatory Visit: Payer: Self-pay

## 2020-11-05 ENCOUNTER — Encounter: Payer: Self-pay | Admitting: Adult Health

## 2020-11-05 ENCOUNTER — Ambulatory Visit (INDEPENDENT_AMBULATORY_CARE_PROVIDER_SITE_OTHER): Payer: BC Managed Care – PPO | Admitting: Adult Health

## 2020-11-05 DIAGNOSIS — J45990 Exercise induced bronchospasm: Secondary | ICD-10-CM | POA: Diagnosis not present

## 2020-11-05 MED ORDER — PREDNISONE 10 MG PO TABS: ORAL_TABLET | ORAL | 0 refills | Status: DC

## 2020-11-05 MED ORDER — BENZONATATE 200 MG PO CAPS: 200.0000 mg | ORAL_CAPSULE | Freq: Three times a day (TID) | ORAL | 1 refills | Status: DC | PRN

## 2020-11-05 NOTE — Patient Instructions (Addendum)
Covid testing as directed.  Begin Delsym 2 tsp Twice daily  For cough as needed.  Tessalon Three times a day  For cough As needed.  Continue on Symbicort 2 puffs Twice daily, rinse after use.    Albuterol inhaler As needed   Chest xray today.  Prednisone taper over next week.  Follow up in 4 weeks with PFTs with Dr. Elsworth Soho  Or [place] NP  Please contact office for sooner follow up if symptoms do not improve or worsen or seek emergency care

## 2020-11-05 NOTE — Progress Notes (Signed)
Virtual Visit via Telephone Note  I connected with Frederick Fox on 11/05/20 at  3:30 PM EST by telephone and verified that I am speaking with the correct person using two identifiers.  Location: Patient:Home  Provider: Office    I discussed the limitations, risks, security and privacy concerns of performing an evaluation and management service by telephone and the availability of in person appointments. I also discussed with the patient that there may be a patient responsible charge related to this service. The patient expressed understanding and agreed to proceed.   History of Present Illness: 55 year old male followed for obstructive sleep apnea     Today's telemedicine visit is an acute office visit.  Patient complains of 3 weeks of minimally productive cough chest tightness wheezing.   Patient does have underlying and exercise-induced asthma. Managed in past by PCP .  He does have Symbicort to use as needed.  Says over the last 3 weeks he has restarted his Symbicort and is using it every day twice a day.  He is having to use his albuterol at least a couple times a day.  Patient was called in prednisone 20 mg for 5 days.  Patient says it helped for just a little bit.  But he continues to have ongoing symptoms.  Patient did test for COVID-19 at initially 3 weeks ago and had a negative home test.  Patient says cough is minimally productive.  Has no discolored mucus or fever.  No loss of taste or smell.  No chest pain palpitations syncope calf pain leg swelling.  Past Medical History:  Diagnosis Date  . Asthma   . Constipation   . Depression   . GERD (gastroesophageal reflux disease)   . Gout    hx of gout  . H/O hiatal hernia   . Hyperlipidemia   . Hypertension   . Prediabetes   . Proteinuria 2005  . Sleep apnea    uses c-pap  . SOB (shortness of breath) on exertion   . Vitamin D deficiency     Current Outpatient Medications on File Prior to Visit  Medication Sig Dispense  Refill  . acetaminophen (TYLENOL) 500 MG tablet Take 1,000 mg by mouth every 6 (six) hours as needed for mild pain.    [name] albuterol (PROAIR HFA) 108 (90 Base) MCG/ACT inhaler Inhale 2 puffs into the lungs every 6 (six) hours as needed for wheezing or shortness of breath. 1 each 5  . allopurinol (ZYLOPRIM) 300 MG tablet Take 1 tablet by mouth once daily 90 tablet 3  . amLODipine (NORVASC) 10 MG tablet Take 1 tablet by mouth once daily 90 tablet 0  . blood glucose meter kit and supplies KIT Dispense based on patient and insurance preference. Use up to four times daily as directed. (FOR E11.9). 1 each 0  . budesonide-formoterol (SYMBICORT) 80-4.5 MCG/ACT inhaler Inhale 2 puffs into the lungs 2 (two) times daily. 1 each 5  . Cholecalciferol (VITAMIN D3 PO) Take by mouth.    . clomiPHENE (CLOMID) 50 MG tablet Take 50 mg by mouth daily.    [name] escitalopram (LEXAPRO) 20 MG tablet Take 1 tablet (20 mg total) by mouth daily. 90 tablet 1  . famotidine (PEPCID) 20 MG tablet Take 2 tablets (40 mg total) by mouth daily.    [name] losartan (COZAAR) 100 MG tablet Take 1 tablet (100 mg total) by mouth daily. 90 tablet 2  . Multiple Vitamin (MULTIVITAMIN ADULT PO) Take by mouth.    [name]  nebivolol (BYSTOLIC) 5 MG tablet Take 1 tablet (5 mg total) by mouth daily. 90 tablet 1  . pravastatin (PRAVACHOL) 10 MG tablet Take 1 tablet (10 mg total) by mouth daily. 30 tablet 5  . Semaglutide (RYBELSUS) 14 MG TABS Take 14 mg by mouth daily before breakfast. 90 tablet 1  . Turmeric 500 MG CAPS Take by mouth daily.    . Vitamin D, Ergocalciferol, (DRISDOL) 1.25 MG (50000 UNIT) CAPS capsule Take 1 capsule (50,000 Units total) by mouth every 7 (seven) days. 4 capsule 0   No current facility-administered medications on file prior to visit.       Observations/Objective: Split-night study 01/2016 AHI 20/hour with lowest desaturation 84% corrected by CPAP of 9 cm with full facemask  Assessment and Plan: Slow to resolve asthmatic  bronchitic flare. Patient does not appear to have any infectious symptoms with no discolored mucus or fever.  Since he has acute respiratory symptoms we will have him test for COVID-19 1 additional time.  Patient is to treat symptoms with cough.  We will add in Delsym and Tessalon.  Continue on Symbicort.  We will give a short course of prednisone.  Patient does have underlying diabetes.  That is very well controlled.  Advised that this may increase his blood sugars temporarily.  Will check chest x-ray today.  Patient is to come to our office prior to closing today.  Since we mainly manage his obstructive sleep apnea.  We will have him return to the office in few weeks for a follow-up visit and pulmonary function testing to get his baseline lung function.  Plan  Patient Instructions  Covid testing as directed.  Begin Delsym 2 tsp Twice daily  For cough as needed.  Tessalon Three times a day  For cough As needed.  Continue on Symbicort 2 puffs Twice daily, rinse after use.    Albuterol inhaler As needed   Chest xray today.  Prednisone taper over next week.  Follow up in 4 weeks with PFTs with Dr. Elsworth Soho  Or Travonne Schowalter NP  Please contact office for sooner follow up if symptoms do not improve or worsen or seek emergency care         Follow Up Instructions: Follow-up in 4 weeks and as needed   I discussed the assessment and treatment plan with the patient. The patient was provided an opportunity to ask questions and all were answered. The patient agreed with the plan and demonstrated an understanding of the instructions.   The patient was advised to call back or seek an in-person evaluation if the symptoms worsen or if the condition fails to improve as anticipated.  I provided 22  minutes of non-face-to-face time during this encounter.   Rexene Edison, NP

## 2020-11-05 NOTE — Telephone Encounter (Signed)
Spoke with the pt and scheduled for appt with TP for today at 3:30

## 2020-11-07 ENCOUNTER — Telehealth: Payer: Self-pay | Admitting: Adult Health

## 2020-11-07 NOTE — Telephone Encounter (Signed)
Frederick Needles, NP  11/07/2020 2:41 PM EST      Chest xray shows no sign of PNA . Bronchitic/Asthma changes  Cont w/ ov recs and follow up  Please contact office for sooner follow up if symptoms do not improve or worsen or seek emergency care  [place] and spoke with pt letting him know the results of the cxr and he verbalized understanding. Nothing further needed.

## 2020-11-15 ENCOUNTER — Encounter (INDEPENDENT_AMBULATORY_CARE_PROVIDER_SITE_OTHER): Payer: Self-pay | Admitting: Family Medicine

## 2020-11-18 ENCOUNTER — Ambulatory Visit (INDEPENDENT_AMBULATORY_CARE_PROVIDER_SITE_OTHER): Payer: Self-pay | Admitting: Family Medicine

## 2020-11-25 ENCOUNTER — Ambulatory Visit (INDEPENDENT_AMBULATORY_CARE_PROVIDER_SITE_OTHER): Payer: BC Managed Care – PPO | Admitting: Adult Health

## 2020-11-25 ENCOUNTER — Ambulatory Visit (INDEPENDENT_AMBULATORY_CARE_PROVIDER_SITE_OTHER): Payer: BC Managed Care – PPO | Admitting: Family Medicine

## 2020-11-25 ENCOUNTER — Encounter (INDEPENDENT_AMBULATORY_CARE_PROVIDER_SITE_OTHER): Payer: Self-pay

## 2020-11-25 ENCOUNTER — Other Ambulatory Visit: Payer: Self-pay

## 2020-11-25 ENCOUNTER — Encounter (INDEPENDENT_AMBULATORY_CARE_PROVIDER_SITE_OTHER): Payer: Self-pay | Admitting: Adult Health

## 2020-11-25 VITALS — BP 125/65 | HR 81 | Temp 98.4°F | Ht 69.0 in | Wt 249.0 lb

## 2020-11-25 DIAGNOSIS — Z6836 Body mass index (BMI) 36.0-36.9, adult: Secondary | ICD-10-CM

## 2020-11-25 DIAGNOSIS — E1159 Type 2 diabetes mellitus with other circulatory complications: Secondary | ICD-10-CM

## 2020-11-25 DIAGNOSIS — E1169 Type 2 diabetes mellitus with other specified complication: Secondary | ICD-10-CM | POA: Diagnosis not present

## 2020-11-25 DIAGNOSIS — I152 Hypertension secondary to endocrine disorders: Secondary | ICD-10-CM | POA: Diagnosis not present

## 2020-11-26 NOTE — Progress Notes (Signed)
Chief Complaint:   OBESITY Frederick Fox is here to discuss his progress with his obesity treatment plan along with follow-up of his obesity related diagnoses. Frederick Fox is on the Category 3 Plan and states he is following his eating plan approximately 100% of the time. Frederick Fox states he is cardio and stretching 20 minutes 3-4 times per week.  Today's visit was #: 7 Starting weight: 256 lbs Starting date: 08/06/2020 Today's weight: 249 lbs Today's date: 11/25/2020 Total lbs lost to date: 7 lbs Total lbs lost since last in-office visit: 0  Interim History: Frederick Fox has been focusing on protein and healthy carb at each meal. He has substituted 45 calorie bread with 60 calorie carb count wraps.  Pt is on Rybelsus 14mg  QD (T2D).  GLP-1 managed by PCP. Interval goal: lose 1-2 lbs by next OV.  Subjective:   1. Hypertension associated with type 2 diabetes mellitus ([place]) Pt's BP and heart rate are at goal today. CKD is a result of uncontrolled hypertension. He is on losartan 100 mg daily, amlodipine 10 mg daily, and Bystolic 5 mg daily.  BP Readings from Last 3 Encounters:  11/25/20 125/65  10/30/20 116/76  10/14/20 119/69    2. Type 2 diabetes mellitus with other specified complication, without long-term current use of insulin ([name] Pt is on Rybelsus 14mg  QD.  GLP-1 managed by PCP. Pt denies mass in neck, dysphagia, dyspepsia, or persistent hoarseness.  Lab Results  Component Value Date   HGBA1C 5.7 (H) 10/14/2020   HGBA1C 6.0 (H) 06/04/2020   HGBA1C 6.1 12/06/2019   Lab Results  Component Value Date   MICROALBUR 251.7 (H) 10/09/2008   LDLCALC 111 (H) 06/04/2020   CREATININE 2.9 (A) 09/09/2020   Lab Results  Component Value Date   INSULIN 42.0 (H) 08/06/2020    Assessment/Plan:   1. Hypertension associated with type 2 diabetes mellitus ([place]) Frederick Fox is working on healthy weight loss and exercise to improve blood pressure control. We will watch for signs of hypotension as he  continues his lifestyle modifications. Continue current anti-hypertensive regimen.  2. Type 2 diabetes mellitus with other specified complication, without long-term current use of insulin ([name] Good blood sugar control is important to decrease the likelihood of diabetic complications such as nephropathy, neuropathy, limb loss, blindness, coronary artery disease, and death. Intensive lifestyle modification including diet, exercise and weight loss are the first line of treatment for diabetes. Continue Rybelsus and decrease simple carb intake.  3. Class 2 severe obesity with serious comorbidity and body mass index (BMI) of 36.0 to 36.9 in adult, unspecified obesity type ([name] Frederick Fox is currently in the action stage of change. As such, his goal is to continue with weight loss efforts. He has agreed to the Category 3 Plan.   Handouts: Additional Breakfast Options & Microwave Meal List  Exercise goals: As is  Behavioral modification strategies: increasing lean protein intake, decreasing simple carbohydrates, no skipping meals, meal planning and cooking strategies, keeping healthy foods in the home and planning for success.  Frederick Fox has agreed to follow-up with our clinic in 2 weeks. He was informed of the importance of frequent follow-up visits to maximize his success with intensive lifestyle modifications for his multiple health conditions.   Objective:   Blood pressure 125/65, pulse 81, temperature 98.4 F (36.9 C), height 5\' 9"  (1.753 m), weight 249 lb (112.9 kg), SpO2 97 %. Body mass index is 36.77 kg/m.  General: Cooperative, alert, well developed, in no acute distress. HEENT: Conjunctivae and  lids unremarkable. Cardiovascular: Regular rhythm.  Lungs: Normal work of breathing. Neurologic: No focal deficits.   Lab Results  Component Value Date   CREATININE 2.9 (A) 09/09/2020   BUN 59 (A) 09/09/2020   NA 140 09/09/2020   K 4.8 09/09/2020   CL 105 09/09/2020   CO2 22 09/09/2020   Lab  Results  Component Value Date   ALT 22 06/04/2020   AST 22 06/04/2020   ALKPHOS 86 12/06/2019   BILITOT 0.3 06/04/2020   Lab Results  Component Value Date   HGBA1C 5.7 (H) 10/14/2020   HGBA1C 6.0 (H) 06/04/2020   HGBA1C 6.1 12/06/2019   HGBA1C 6.4 07/14/2019   HGBA1C 6.7 (H) 12/23/2018   Lab Results  Component Value Date   INSULIN 42.0 (H) 08/06/2020   Lab Results  Component Value Date   TSH 1.060 08/06/2020   Lab Results  Component Value Date   CHOL 201 (H) 06/04/2020   HDL 38 (L) 06/04/2020   LDLCALC 111 (H) 06/04/2020   LDLDIRECT 121.0 07/14/2019   TRIG 365 (H) 06/04/2020   CHOLHDL 5.3 (H) 06/04/2020   Lab Results  Component Value Date   WBC 8.9 09/09/2020   HGB 13.8 09/09/2020   HCT 42 09/09/2020   MCV 84.4 06/04/2020   PLT 225 09/09/2020   Lab Results  Component Value Date   IRON 33 09/09/2020   TIBC 276 09/09/2020   FERRITIN 59 09/09/2020    Attestation Statements:   Reviewed by clinician on day of visit: allergies, medications, problem list, medical history, surgical history, family history, social history, and previous encounter notes.  Time spent on visit including pre-visit chart review and post-visit care and charting was 32 minutes.   Coral Ceo, am acting as Location manager for Mina Marble, NP.  I have reviewed the above documentation for accuracy and completeness, and I agree with the above. -  Yahsir Wickens d. Jahmar Mckelvy, NP-C

## 2020-11-29 ENCOUNTER — Other Ambulatory Visit: Payer: Self-pay | Admitting: *Deleted

## 2020-11-29 DIAGNOSIS — J45990 Exercise induced bronchospasm: Secondary | ICD-10-CM

## 2020-12-02 ENCOUNTER — Other Ambulatory Visit: Payer: Self-pay

## 2020-12-06 ENCOUNTER — Encounter: Payer: Self-pay | Admitting: Pulmonary Disease

## 2020-12-06 ENCOUNTER — Ambulatory Visit: Payer: BC Managed Care – PPO | Admitting: Internal Medicine

## 2020-12-06 ENCOUNTER — Other Ambulatory Visit: Payer: Self-pay

## 2020-12-06 ENCOUNTER — Ambulatory Visit: Payer: Self-pay | Admitting: Pulmonary Disease

## 2020-12-06 ENCOUNTER — Ambulatory Visit: Payer: BC Managed Care – PPO | Admitting: Pulmonary Disease

## 2020-12-06 DIAGNOSIS — G4733 Obstructive sleep apnea (adult) (pediatric): Secondary | ICD-10-CM

## 2020-12-06 DIAGNOSIS — Z9989 Dependence on other enabling machines and devices: Secondary | ICD-10-CM | POA: Diagnosis not present

## 2020-12-06 DIAGNOSIS — J45990 Exercise induced bronchospasm: Secondary | ICD-10-CM

## 2020-12-06 NOTE — Assessment & Plan Note (Signed)
CPAP download was reviewed 11 cm which objectively confirms compliance , no leak and good control of events.  CPAP recently helped improve his daytime somnolence and fatigue

## 2020-12-06 NOTE — Patient Instructions (Signed)
Stay on symbicort twice daily until April  CPAP is working well on 11 cm

## 2020-12-06 NOTE — Progress Notes (Signed)
   Subjective:    Patient ID: Frederick Fox, male    DOB: 07-11-1966, 55 y.o.   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  Self Covid tested negative Chest x-ray 1/25 independently reviewed showed mild diffuse interstitial thickening He has been taking Symbicort twice daily and is about 90% back to baseline, cough is resolved  Previously he had chronic cough induced by GERD which resolved with famotidine.  Denies obvious heartburn  No problems with mask or pressure, feels well rested denies sleep pressure Significant tests/ events reviewed  NPSG 01/2016 AHI 20/hour with lowest desaturation 84% corrected by CPAP of 9 cm with full facemask  Review of Systems neg for any significant sore throat, dysphagia, itching, sneezing, nasal congestion or excess/ purulent secretions, fever, chills, sweats, unintended wt loss, pleuritic or exertional cp, hempoptysis, orthopnea pnd or change in chronic leg swelling. Also denies presyncope, palpitations, heartburn, abdominal pain, nausea, vomiting, diarrhea or change in bowel or urinary habits, dysuria,hematuria, rash, arthralgias, visual complaints, headache, numbness weakness or ataxia.     Objective:   Physical Exam  Gen. Pleasant, obese, in no distress ENT - no lesions, no post nasal drip Neck: No JVD, no thyromegaly, no carotid bruits Lungs: no use of accessory muscles, no dullness to percussion, decreased without rales or rhonchi  Cardiovascular: Rhythm regular, heart sounds  normal, no murmurs or gallops, no peripheral edema Musculoskeletal: No deformities, no cyanosis or clubbing , no tremors       Assessment & Plan:

## 2020-12-06 NOTE — Assessment & Plan Note (Signed)
Has persistent symptoms unable to identify trigger in his environment.  No obvious heartburn remains on Pepcid. Reasonable to stay on Symbicort twice daily since this has controlled his medications for at least 3 months and then up attempt stepdown in April. Await PFTs in March  If persistent symptoms on follow-up in 6 months, will proceed with RAST testing

## 2020-12-10 ENCOUNTER — Other Ambulatory Visit: Payer: Self-pay

## 2020-12-10 ENCOUNTER — Ambulatory Visit (INDEPENDENT_AMBULATORY_CARE_PROVIDER_SITE_OTHER): Payer: BC Managed Care – PPO | Admitting: Adult Health

## 2020-12-10 ENCOUNTER — Encounter (INDEPENDENT_AMBULATORY_CARE_PROVIDER_SITE_OTHER): Payer: Self-pay | Admitting: Adult Health

## 2020-12-10 VITALS — BP 121/67 | HR 71 | Temp 98.5°F | Ht 70.0 in | Wt 245.0 lb

## 2020-12-10 DIAGNOSIS — E1169 Type 2 diabetes mellitus with other specified complication: Secondary | ICD-10-CM

## 2020-12-10 DIAGNOSIS — E1159 Type 2 diabetes mellitus with other circulatory complications: Secondary | ICD-10-CM

## 2020-12-10 DIAGNOSIS — Z6836 Body mass index (BMI) 36.0-36.9, adult: Secondary | ICD-10-CM

## 2020-12-10 DIAGNOSIS — I152 Hypertension secondary to endocrine disorders: Secondary | ICD-10-CM

## 2020-12-10 DIAGNOSIS — E559 Vitamin D deficiency, unspecified: Secondary | ICD-10-CM

## 2020-12-11 NOTE — Progress Notes (Signed)
Chief Complaint:   OBESITY Frederick Fox is here to discuss his progress with his obesity treatment plan along with follow-up of his obesity related diagnoses. Frederick Fox is on the Category 3 Plan and states he is following his eating plan approximately 100% of the time. Frederick Fox states he is doing cardio and stretching 30 minutes 5 times per week.  Today's visit was #: 8 Starting weight: 256 lbs Starting date: 08/06/2020 Today's weight: 245 lbs Today's date: 12/10/2020 Total lbs lost to date: 11 lbs Total lbs lost since last in-office visit: 4 lbs  Interim History: Frederick Fox has really enjoyed having high protein special K with [employer] milk for breakfast. Lunch and dinner are easy to follow on category 3.  Interval goal: lose another 4 lbs by next OV.  Subjective:   1. Hypertension associated with type 2 diabetes mellitus (Frederick Fox) Frederick Fox BP/HR are excellent in OV today. He is on losartan 100 mg, amlodipine 10 mg daily, and Bystolic 5 mg daily.  BP Readings from Last 3 Encounters:  12/10/20 121/67  12/06/20 124/82  11/25/20 125/65    2. Vitamin D deficiency Frederick Fox's Vitamin D level was 41.0 (below goal of 50) on 09/09/2020. He is currently taking prescription vitamin D 50,000 IU each week. He denies nausea, vomiting or muscle weakness.   Ref. Range 09/09/2020 00:00  Vitamin D, 25-Hydroxy Unknown 41.0   3. Type 2 diabetes mellitus with other specified complication, without long-term current use of insulin ([name] Frederick Fox's ambulatory fasting BG ranges mid 90's- 115. He denies symptoms of hypoglycemia. He is on Rybelsus 14 mg daily and tolerating it well. His A1c was 5.7 on 10/14/2020.  Lab Results  Component Value Date   HGBA1C 5.7 (H) 10/14/2020   HGBA1C 6.0 (H) 06/04/2020   HGBA1C 6.1 12/06/2019   Lab Results  Component Value Date   MICROALBUR 251.7 (H) 10/09/2008   LDLCALC 111 (H) 06/04/2020   CREATININE 2.9 (A) 09/09/2020   Lab Results  Component Value Date   INSULIN 42.0 (H)  08/06/2020    Assessment/Plan:   1. Hypertension associated with type 2 diabetes mellitus ([place]) Frederick Fox is working on healthy weight loss and exercise to improve blood pressure control. We will watch for signs of hypotension as he continues his lifestyle modifications. Continue current anti-hypertensive regimen.  2. Vitamin D deficiency Low Vitamin D level contributes to fatigue and are associated with obesity, breast, and colon cancer. He agrees to continue to take prescription Vitamin D @50 ,000 IU every week and will follow-up for routine testing of Vitamin D, at least 2-3 times per year to avoid over-replacement.  3. Type 2 diabetes mellitus with other specified complication, without long-term current use of insulin ([name] Good blood sugar control is important to decrease the likelihood of diabetic complications such as nephropathy, neuropathy, limb loss, blindness, coronary artery disease, and death. Intensive lifestyle modification including diet, exercise and weight loss are the first line of treatment for diabetes. Continue Rybelsus and monitor for symptoms of hypoglycemia.  4. Class 2 severe obesity with serious comorbidity and body mass index (BMI) of 36.0 to 36.9 in adult, unspecified obesity type ([name] Frederick Fox is currently in the action stage of change. As such, his goal is to continue with weight loss efforts. He has agreed to the Category 3 Plan.   Exercise goals: As is  Behavioral modification strategies: increasing lean protein intake, decreasing simple carbohydrates, meal planning and cooking strategies and planning for success.  Frederick Fox has agreed to follow-up with our clinic  in 2 weeks. He was informed of the importance of frequent follow-up visits to maximize his success with intensive lifestyle modifications for his multiple health conditions.   Objective:   Blood pressure 121/67, pulse 71, temperature 98.5 F (36.9 C), height 5\' 10"  (1.778 m), weight 245 lb (111.1 kg), SpO2  98 %. Body mass index is 35.15 kg/m.  General: Cooperative, alert, well developed, in no acute distress. HEENT: Conjunctivae and lids unremarkable. Cardiovascular: Regular rhythm.  Lungs: Normal work of breathing. Neurologic: No focal deficits.   Lab Results  Component Value Date   CREATININE 2.9 (A) 09/09/2020   BUN 59 (A) 09/09/2020   NA 140 09/09/2020   K 4.8 09/09/2020   CL 105 09/09/2020   CO2 22 09/09/2020   Lab Results  Component Value Date   ALT 22 06/04/2020   AST 22 06/04/2020   ALKPHOS 86 12/06/2019   BILITOT 0.3 06/04/2020   Lab Results  Component Value Date   HGBA1C 5.7 (H) 10/14/2020   HGBA1C 6.0 (H) 06/04/2020   HGBA1C 6.1 12/06/2019   HGBA1C 6.4 07/14/2019   HGBA1C 6.7 (H) 12/23/2018   Lab Results  Component Value Date   INSULIN 42.0 (H) 08/06/2020   Lab Results  Component Value Date   TSH 1.060 08/06/2020   Lab Results  Component Value Date   CHOL 201 (H) 06/04/2020   HDL 38 (L) 06/04/2020   LDLCALC 111 (H) 06/04/2020   LDLDIRECT 121.0 07/14/2019   TRIG 365 (H) 06/04/2020   CHOLHDL 5.3 (H) 06/04/2020   Lab Results  Component Value Date   WBC 8.9 09/09/2020   HGB 13.8 09/09/2020   HCT 42 09/09/2020   MCV 84.4 06/04/2020   PLT 225 09/09/2020   Lab Results  Component Value Date   IRON 33 09/09/2020   TIBC 276 09/09/2020   FERRITIN 59 09/09/2020    Attestation Statements:   Reviewed by clinician on day of visit: allergies, medications, problem list, medical history, surgical history, family history, social history, and previous encounter notes.  Time spent on visit including pre-visit chart review and post-visit care and charting was 31 minutes.   Coral Ceo, am acting as Location manager for Mina Marble, NP.  I have reviewed the above documentation for accuracy and completeness, and I agree with the above. -  Helem Reesor d. Victory Strollo, NP-C

## 2020-12-12 ENCOUNTER — Other Ambulatory Visit: Payer: Self-pay | Admitting: Pulmonary Disease

## 2020-12-12 ENCOUNTER — Other Ambulatory Visit: Payer: Self-pay

## 2020-12-12 ENCOUNTER — Ambulatory Visit (INDEPENDENT_AMBULATORY_CARE_PROVIDER_SITE_OTHER): Payer: BC Managed Care – PPO | Admitting: Pulmonary Disease

## 2020-12-12 DIAGNOSIS — J45990 Exercise induced bronchospasm: Secondary | ICD-10-CM

## 2020-12-12 LAB — PULMONARY FUNCTION TEST
DL/VA % pred: 96 %
DL/VA: 4.25 ml/min/mmHg/L
DLCO cor % pred: 78 %
DLCO cor: 21.89 ml/min/mmHg
DLCO unc % pred: 78 %
DLCO unc: 21.89 ml/min/mmHg
FEF 25-75 Post: 1.78 L/sec
FEF 25-75 Pre: 1.52 L/sec
FEF2575-%Change-Post: 17 %
FEF2575-%Pred-Post: 55 %
FEF2575-%Pred-Pre: 47 %
FEV1-%Change-Post: 6 %
FEV1-%Pred-Post: 63 %
FEV1-%Pred-Pre: 59 %
FEV1-Post: 2.36 L
FEV1-Pre: 2.21 L
FEV1FVC-%Change-Post: 2 %
FEV1FVC-%Pred-Pre: 90 %
FEV6-%Change-Post: 5 %
FEV6-%Pred-Post: 70 %
FEV6-%Pred-Pre: 67 %
FEV6-Post: 3.28 L
FEV6-Pre: 3.12 L
FEV6FVC-%Change-Post: 0 %
FEV6FVC-%Pred-Post: 103 %
FEV6FVC-%Pred-Pre: 102 %
FVC-%Change-Post: 4 %
FVC-%Pred-Post: 68 %
FVC-%Pred-Pre: 65 %
FVC-Post: 3.31 L
Post FEV1/FVC ratio: 71 %
Post FEV6/FVC ratio: 100 %
Pre FEV1/FVC ratio: 70 %
Pre FEV6/FVC Ratio: 99 %
RV % pred: 121 %
RV: 2.48 L
TLC % pred: 82 %
TLC: 5.59 L

## 2020-12-12 NOTE — Progress Notes (Signed)
PFT done today. 

## 2020-12-13 LAB — PULMONARY FUNCTION TEST
DL/VA % pred: 96 %
DL/VA: 4.25 ml/min/mmHg/L
DLCO cor % pred: 78 %
DLCO cor: 21.89 ml/min/mmHg
DLCO unc % pred: 78 %
DLCO unc: 21.89 ml/min/mmHg
FEF 25-75 Post: 1.78 L/sec
FEF 25-75 Pre: 1.52 L/sec
FEF2575-%Change-Post: 17 %
FEF2575-%Pred-Post: 55 %
FEF2575-%Pred-Pre: 47 %
FEV1-%Change-Post: 6 %
FEV1-%Pred-Post: 63 %
FEV1-%Pred-Pre: 59 %
FEV1-Post: 2.36 L
FEV1-Pre: 2.21 L
FEV1FVC-%Change-Post: 2 %
FEV1FVC-%Pred-Pre: 90 %
FEV6-%Change-Post: 5 %
FEV6-%Pred-Post: 70 %
FEV6-%Pred-Pre: 67 %
FEV6-Post: 3.28 L
FEV6-Pre: 3.12 L
FEV6FVC-%Change-Post: 0 %
FEV6FVC-%Pred-Post: 103 %
FEV6FVC-%Pred-Pre: 102 %
FVC-%Change-Post: 4 %
FVC-%Pred-Post: 68 %
FVC-%Pred-Pre: 65 %
FVC-Post: 3.31 L
Post FEV1/FVC ratio: 71 %
Post FEV6/FVC ratio: 100 %
Pre FEV1/FVC ratio: 70 %
Pre FEV6/FVC Ratio: 99 %
RV % pred: 121 %
RV: 2.48 L
TLC % pred: 82 %
TLC: 5.59 L

## 2020-12-13 NOTE — Progress Notes (Signed)
Called and left message on voicemail to please return phone call to go over PFT results. Contact number provided.

## 2020-12-17 NOTE — Progress Notes (Signed)
Called and left message on voicemail to please return phone call to go over PFT results. Contact number provided.

## 2020-12-18 ENCOUNTER — Telehealth: Payer: Self-pay | Admitting: Pulmonary Disease

## 2020-12-18 NOTE — Telephone Encounter (Signed)
Rigoberto Noel, MD  12/13/2020 3:57 PM EST      Does confirm asthma with lung capacity at 60% Stay on Symbicort for now as we discussed   Spoke with the pt and notified of results and he verbalized understanding and denied any questions.

## 2020-12-22 ENCOUNTER — Encounter: Payer: Self-pay | Admitting: Internal Medicine

## 2020-12-22 NOTE — Progress Notes (Signed)
Subjective:    Patient ID: Frederick Fox, male    DOB: [birth year], 55 y.o.   MRN: [id number]  HPI The patient is here for follow up of their chronic medical problems, including htn, DM, CKD, gout, depression, gerd      Medications and allergies reviewed with patient and updated if appropriate.  Patient Active Problem List   Diagnosis Date Noted  . At risk for hypoglycemia 09/16/2020  . Stage 3a chronic kidney disease ([place]) 09/02/2020  . Diabetes mellitus ([place]) 08/21/2020  . Hypertension associated with type 2 diabetes mellitus ([place]) 08/21/2020  . Hyperlipidemia associated with type 2 diabetes mellitus ([place]) 08/21/2020  . Acute renal failure superimposed on stage 3a chronic kidney disease ([place]) 08/21/2020  . At risk for heart disease 08/21/2020  . Prediabetes 08/06/2020  . Hyperlipidemia 08/06/2020  . Other fatigue 08/06/2020  . SOB (shortness of breath) on exertion 08/06/2020  . OSA on CPAP 08/06/2020  . Depression 08/06/2020  . At risk for impaired metabolic function 88/08/314  . Class 2 severe obesity with serious comorbidity and body mass index (BMI) of 36.0 to 36.9 in adult [facility]) 04/05/2020  . Rash 01/30/2020  . Finger pain, right 12/06/2019  . Joint pain 12/23/2018  . Decreased libido 10/24/2018  . Vitamin D deficiency 10/23/2018  . GERD (gastroesophageal reflux disease) 04/22/2018  . Cervical radiculopathy at C5, chronic 08/19/2017  . Ulnar neuropathy of left upper extremity, mild 08/19/2017  . Bilateral arm weakness 05/18/2017  . Tingling 05/18/2017  . Diabetes ([place]) 10/12/2016  . Neck pain, chronic 05/13/2016  . Exercise-induced asthma 03/24/2016  . Hiatal hernia 03/24/2016  . OSA (obstructive sleep apnea) 01/21/2016  . Chronic renal insufficiency, stage III (moderate) ([place]) 01/31/2015  . High-renin essential hypertension 08/27/2013  . Depression, recurrent ([place]) 08/22/2013  . MICROSCOPIC HEMATURIA 10/23/2010  . GOUT 01/10/2009  . Proteinuria  10/09/2008  . Essential hypertension 03/29/2008    Current Outpatient Medications on File Prior to Visit  Medication Sig Dispense Refill  . acetaminophen (TYLENOL) 500 MG tablet Take 1,000 mg by mouth every 6 (six) hours as needed for mild pain.    [name] albuterol (PROAIR HFA) 108 (90 Base) MCG/ACT inhaler Inhale 2 puffs into the lungs every 6 (six) hours as needed for wheezing or shortness of breath. 1 each 5  . allopurinol (ZYLOPRIM) 300 MG tablet Take 1 tablet by mouth once daily 90 tablet 3  . amLODipine (NORVASC) 10 MG tablet Take 1 tablet by mouth once daily 90 tablet 0  . blood glucose meter kit and supplies KIT Dispense based on patient and insurance preference. Use up to four times daily as directed. (FOR E11.9). 1 each 0  . budesonide-formoterol (SYMBICORT) 80-4.5 MCG/ACT inhaler Inhale 2 puffs into the lungs 2 (two) times daily. 1 each 5  . Cholecalciferol (VITAMIN D3 PO) Take by mouth.    . clomiPHENE (CLOMID) 50 MG tablet Take 50 mg by mouth daily.    [name] escitalopram (LEXAPRO) 20 MG tablet Take 1 tablet (20 mg total) by mouth daily. 90 tablet 1  . famotidine (PEPCID) 20 MG tablet Take 2 tablets (40 mg total) by mouth daily.    [name] losartan (COZAAR) 100 MG tablet Take 1 tablet (100 mg total) by mouth daily. 90 tablet 2  . Multiple Vitamin (MULTIVITAMIN ADULT PO) Take by mouth.    . nebivolol (BYSTOLIC) 5 MG tablet Take 1 tablet (5 mg total) by mouth daily. 90 tablet 1  . pravastatin (PRAVACHOL) 10 MG  tablet Take 1 tablet (10 mg total) by mouth daily. 30 tablet 5  . Semaglutide (RYBELSUS) 14 MG TABS Take 14 mg by mouth daily before breakfast. 90 tablet 1  . Turmeric 500 MG CAPS Take by mouth daily.    . Vitamin D, Ergocalciferol, (DRISDOL) 1.25 MG (50000 UNIT) CAPS capsule Take 1 capsule (50,000 Units total) by mouth every 7 (seven) days. 4 capsule 0   No current facility-administered medications on file prior to visit.    Past Medical History:  Diagnosis Date  . Asthma   .  Constipation   . Depression   . GERD (gastroesophageal reflux disease)   . Gout    hx of gout  . H/O hiatal hernia   . Hyperlipidemia   . Hypertension   . Prediabetes   . Proteinuria 2005  . Sleep apnea    uses c-pap  . SOB (shortness of breath) on exertion   . Vitamin D deficiency     Past Surgical History:  Procedure Laterality Date  . RENAL BIOPSY  2005   protien in urine, no pathology- nephrologist in [facility]  . WISDOM TOOTH EXTRACTION      Social History   Socioeconomic History  . Marital status: Married    Spouse name: Leamon Arnt  . Number of children: 4  . Years of education: 14  . Highest education level: Not on file  Occupational History  . 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 Smoking status: Former Smoker    Types: Cigars  . Smokeless tobacco: Never Used  . Tobacco comment: occasional ciagar/ last one 2017  Vaping Use  . Vaping Use: Never used  Substance and Sexual Activity  . Alcohol use: Yes    Alcohol/week: 2.0 standard drinks    Types: 2 Glasses of wine per week    Comment: 1-2 glasses on the weekend  . Drug use: No  . Sexual activity: Not on file  Other Topics Concern  . Not on file  Social History Narrative   Low salt diet.  Lives with wife and children in a 2 story home.  Has 2 stepchildren and 2 children of his own.     Works as a third [employer].     Education: college.       Social Determinants of Health   Financial Resource Strain: Not on file  Food Insecurity: Not on file  Transportation Needs: Not on file  Physical Activity: Not on file  Stress: Not on file  Social Connections: Not on file    Family History  Problem Relation Age of Onset  . Hypertension Father   . Parkinson's disease Father   . Depression Father   . Anxiety disorder Father   . Obesity Father   . Parkinsonism Father   . Hypertension Mother   . Hyperlipidemia Mother   . Sleep apnea Mother    . Obesity Mother   . Healthy Brother   . Brain cancer Maternal Grandfather   . Heart attack Paternal Grandfather     Review of Systems     Objective:  There were no vitals filed for this visit. BP Readings from Last 3 Encounters:  12/10/20 121/67  12/06/20 124/82  11/25/20 125/65   Wt Readings from Last 3 Encounters:  12/10/20 245 lb (111.1 kg)  12/06/20 253 lb 12.8 oz (115.1 kg)  11/25/20 249 lb (112.9 kg)   There is no height or weight  on file to calculate BMI.   Physical Exam    Constitutional: Appears well-developed and well-nourished. No distress.  HENT:  Head: Normocephalic and atraumatic.  Neck: Neck supple. No tracheal deviation present. No thyromegaly present.  No cervical lymphadenopathy Cardiovascular: Normal rate, regular rhythm and normal heart sounds.   No murmur heard. No carotid bruit .  No edema Pulmonary/Chest: Effort normal and breath sounds normal. No respiratory distress. No has no wheezes. No rales.  Skin: Skin is warm and dry. Not diaphoretic.  Psychiatric: Normal mood and affect. Behavior is normal.      Assessment & Plan:    See Problem List for Assessment and Plan of chronic medical problems.    This visit occurred during the SARS-CoV-2 public health emergency.  Safety protocols were in place, including screening questions prior to the visit, additional usage of staff PPE, and extensive cleaning of exam room while observing appropriate contact time as indicated for disinfecting solutions.    This encounter was created in error - please disregard.

## 2020-12-22 NOTE — Patient Instructions (Signed)
  Blood work was ordered.     Medications changes include :     Your prescription(s) have been submitted to your pharmacy. Please take as directed and contact our office if you believe you are having problem(s) with the medication(s).   A referral was ordered for        Someone from their office will call you to schedule an appointment.    Please followup in 6 months  

## 2020-12-23 ENCOUNTER — Encounter: Payer: BC Managed Care – PPO | Admitting: Internal Medicine

## 2020-12-23 DIAGNOSIS — F3289 Other specified depressive episodes: Secondary | ICD-10-CM

## 2020-12-23 DIAGNOSIS — M1A9XX0 Chronic gout, unspecified, without tophus (tophi): Secondary | ICD-10-CM

## 2020-12-23 DIAGNOSIS — N184 Chronic kidney disease, stage 4 (severe): Secondary | ICD-10-CM

## 2020-12-23 DIAGNOSIS — E1122 Type 2 diabetes mellitus with diabetic chronic kidney disease: Secondary | ICD-10-CM

## 2020-12-23 DIAGNOSIS — E1169 Type 2 diabetes mellitus with other specified complication: Secondary | ICD-10-CM

## 2020-12-23 DIAGNOSIS — I10 Essential (primary) hypertension: Secondary | ICD-10-CM

## 2020-12-23 DIAGNOSIS — K219 Gastro-esophageal reflux disease without esophagitis: Secondary | ICD-10-CM

## 2020-12-24 ENCOUNTER — Encounter (INDEPENDENT_AMBULATORY_CARE_PROVIDER_SITE_OTHER): Payer: Self-pay | Admitting: Adult Health

## 2020-12-24 ENCOUNTER — Ambulatory Visit (INDEPENDENT_AMBULATORY_CARE_PROVIDER_SITE_OTHER): Payer: BC Managed Care – PPO | Admitting: Adult Health

## 2020-12-24 ENCOUNTER — Other Ambulatory Visit: Payer: Self-pay

## 2020-12-24 VITALS — BP 118/71 | HR 65 | Temp 98.3°F | Ht 70.0 in | Wt 249.0 lb

## 2020-12-24 DIAGNOSIS — N184 Chronic kidney disease, stage 4 (severe): Secondary | ICD-10-CM | POA: Diagnosis not present

## 2020-12-24 DIAGNOSIS — E1169 Type 2 diabetes mellitus with other specified complication: Secondary | ICD-10-CM | POA: Diagnosis not present

## 2020-12-24 DIAGNOSIS — Z9189 Other specified personal risk factors, not elsewhere classified: Secondary | ICD-10-CM

## 2020-12-24 DIAGNOSIS — E662 Morbid (severe) obesity with alveolar hypoventilation: Secondary | ICD-10-CM

## 2020-12-24 DIAGNOSIS — E559 Vitamin D deficiency, unspecified: Secondary | ICD-10-CM

## 2020-12-24 DIAGNOSIS — Z6835 Body mass index (BMI) 35.0-35.9, adult: Secondary | ICD-10-CM

## 2020-12-24 MED ORDER — VITAMIN D (ERGOCALCIFEROL) 1.25 MG (50000 UNIT) PO CAPS: 50000.0000 [IU] | ORAL_CAPSULE | ORAL | 0 refills | Status: DC

## 2020-12-25 NOTE — Progress Notes (Signed)
Chief Complaint:   OBESITY Frederick Fox is here to discuss his progress with his obesity treatment plan along with follow-up of his obesity related diagnoses. Frederick Fox is on the Category 3 Plan and states he is following his eating plan approximately 50% of the time. Frederick Fox states he is doing cardio and bike 30-40 minutes 5 times per week.  Today's visit was #: 9 Starting weight: 256 lbs Starting date: 08/06/2020 Today's weight: 249 lbs Today's date: 12/24/2020 Total lbs lost to date: 7 lbs Total lbs lost since last in-office visit: 0  Interim History: Frederick Fox was seen by PCP yesterday and labs were ordered. He will complete them next week. He and his family traveled to [place], [place] for 5 days. He ate off plan during his vacation.  Interval goal: lose at least 2 lbs at next OV.  Subjective:   1. Type 2 diabetes mellitus with other specified complication, without long-term current use of insulin ([name] Frederick Fox is only taking Rybelsus 14 mg QD. He has only been on this prescription for diabetic control. His 10/14/2020 A1c was 5.7.  Lab Results  Component Value Date   HGBA1C 5.7 (H) 10/14/2020   HGBA1C 6.0 (H) 06/04/2020   HGBA1C 6.1 12/06/2019   Lab Results  Component Value Date   MICROALBUR 251.7 (H) 10/09/2008   LDLCALC 111 (H) 06/04/2020   CREATININE 2.9 (A) 09/09/2020   Lab Results  Component Value Date   INSULIN 42.0 (H) 08/06/2020    2. CKD (chronic kidney disease) stage 4, GFR 15-29 ml/min ([name] 11/29/20212 CMP resulted a GFR 23. It has been in the low 20's in the last several checks.   Ref. Range 09/09/2020 00:00  GFR, Est Non African American Unknown 23   Lab Results  Component Value Date   CREATININE 2.9 (A) 09/09/2020   CREATININE 2.68 (H) 06/04/2020   CREATININE 2.8 (A) 05/13/2020   Lab Results  Component Value Date   CREATININE 2.9 (A) 09/09/2020   BUN 59 (A) 09/09/2020   NA 140 09/09/2020   K 4.8 09/09/2020   CL 105 09/09/2020   CO2 22 09/09/2020   3.  Vitamin D deficiency Frederick Fox's Vitamin D level was below goal of 50 at 41.0 on 09/09/2020. He is currently taking prescription vitamin D 50,000 IU each week. He denies nausea, vomiting or muscle weakness.   Ref. Range 09/09/2020 00:00  Vitamin D, 25-Hydroxy Unknown 41.0   4. At risk for fluid volume overload Frederick Fox is at risk for fluid volume overload due to CKD.  Assessment/Plan:   1. Type 2 diabetes mellitus with other specified complication, without long-term current use of insulin ([name] Good blood sugar control is important to decrease the likelihood of diabetic complications such as nephropathy, neuropathy, limb loss, blindness, coronary artery disease, and death. Intensive lifestyle modification including diet, exercise and weight loss are the first line of treatment for diabetes. Continue oral GLP-1.  2. CKD (chronic kidney disease) stage 4, GFR 15-29 ml/min ([name] Lab results and trends reviewed. We discussed several lifestyle modifications today and he will continue to work on diet, exercise and weight loss efforts. Avoid nephrotoxic medications. Orders and follow up as documented in patient record. continue to avoid NSAIDS.  Counseling . Chronic kidney disease (CKD) happens when the kidneys are damaged over a long period of time. . Most of the time, this condition does not go away, but it can usually be controlled. Steps must be taken to slow down the kidney damage or to stop  it from getting worse. . Intensive lifestyle modifications are the first line treatment for this issue.  Frederick Fox Kitchen Avoid buying foods that are: processed, frozen, or prepackaged to avoid excess salt.  3. Vitamin D deficiency Low Vitamin D level contributes to fatigue and are associated with obesity, breast, and colon cancer. He agrees to continue to take prescription Vitamin D @50 ,000 IU every week and will follow-up for routine testing of Vitamin D, at least 2-3 times per year to avoid over-replacement.  - Vitamin D,  Ergocalciferol, (DRISDOL) 1.25 MG (50000 UNIT) CAPS capsule; Take 1 capsule (50,000 Units total) by mouth every 7 (seven) days.  Dispense: 4 capsule; Refill: 0  4. At risk for fluid volume overload Frederick Fox was given approximately 15 minutes of fluid retention prevention counseling today. He is 55 y.o. male and has risk factors for fluid retention including obesity. We discussed intensive lifestyle modifications today with an emphasis on specific weight loss instructions, proper nutrition and exercise strategies.   Repetitive spaced learning was employed today to elicit superior memory formation and behavioral change.  5. Class 2 obesity with alveolar hypoventilation and body mass index (BMI) of 35.0 to 35.9 in adult, unspecified whether serious comorbidity present [facility]) Frederick Fox is currently in the action stage of change. As such, his goal is to continue with weight loss efforts. He has agreed to the Category 3 Plan.   Exercise goals: As is  Behavioral modification strategies: increasing lean protein intake, decreasing simple carbohydrates, meal planning and cooking strategies and planning for success.  Frederick Fox has agreed to follow-up with our clinic in 2 weeks. He was informed of the importance of frequent follow-up visits to maximize his success with intensive lifestyle modifications for his multiple health conditions.   Objective:   Blood pressure 118/71, pulse 65, temperature 98.3 F (36.8 C), height 5\' 10"  (1.778 m), weight 249 lb (112.9 kg), SpO2 97 %. Body mass index is 35.73 kg/m.  General: Cooperative, alert, well developed, in no acute distress. HEENT: Conjunctivae and lids unremarkable. Cardiovascular: Regular rhythm.  Lungs: Normal work of breathing. Neurologic: No focal deficits.   Lab Results  Component Value Date   CREATININE 2.9 (A) 09/09/2020   BUN 59 (A) 09/09/2020   NA 140 09/09/2020   K 4.8 09/09/2020   CL 105 09/09/2020   CO2 22 09/09/2020   Lab Results   Component Value Date   ALT 22 06/04/2020   AST 22 06/04/2020   ALKPHOS 86 12/06/2019   BILITOT 0.3 06/04/2020   Lab Results  Component Value Date   HGBA1C 5.7 (H) 10/14/2020   HGBA1C 6.0 (H) 06/04/2020   HGBA1C 6.1 12/06/2019   HGBA1C 6.4 07/14/2019   HGBA1C 6.7 (H) 12/23/2018   Lab Results  Component Value Date   INSULIN 42.0 (H) 08/06/2020   Lab Results  Component Value Date   TSH 1.060 08/06/2020   Lab Results  Component Value Date   CHOL 201 (H) 06/04/2020   HDL 38 (L) 06/04/2020   LDLCALC 111 (H) 06/04/2020   LDLDIRECT 121.0 07/14/2019   TRIG 365 (H) 06/04/2020   CHOLHDL 5.3 (H) 06/04/2020   Lab Results  Component Value Date   WBC 8.9 09/09/2020   HGB 13.8 09/09/2020   HCT 42 09/09/2020   MCV 84.4 06/04/2020   PLT 225 09/09/2020   Lab Results  Component Value Date   IRON 33 09/09/2020   TIBC 276 09/09/2020   FERRITIN 59 09/09/2020    Attestation Statements:   Reviewed by clinician  on day of visit: allergies, medications, problem list, medical history, surgical history, family history, social history, and previous encounter notes.  Coral Ceo, am acting as Location manager for Mina Marble, NP.  I have reviewed the above documentation for accuracy and completeness, and I agree with the above. -  Katy d. Danford, NP-C

## 2020-12-30 NOTE — Patient Instructions (Addendum)
    Blood work was ordered.      Medications changes include :   none     Please followup in 6 months  

## 2020-12-30 NOTE — Progress Notes (Signed)
Subjective:    Patient ID: Frederick Fox, male    DOB: 07-21-1966, 55 y.o.   MRN: [id number]  HPI The patient is here for follow up of their chronic medical problems, including htn, DM, ckd, gout, depression, gerd, obesity  He is going to the [facility] clinic.     Medications and allergies reviewed with patient and updated if appropriate.  Patient Active Problem List   Diagnosis Date Noted  . At risk for hypoglycemia 09/16/2020  . Hyperlipidemia associated with type 2 diabetes mellitus ([place]) 08/21/2020  . At risk for heart disease 08/21/2020  . Hyperlipidemia 08/06/2020  . SOB (shortness of breath) on exertion 08/06/2020  . OSA on CPAP 08/06/2020  . At risk for impaired metabolic function 59/93/5701  . Class 2 obesity with alveolar hypoventilation and body mass index (BMI) of 35.0 to 35.9 in adult ([place]) 04/05/2020  . Rash 01/30/2020  . Finger pain, right 12/06/2019  . Joint pain 12/23/2018  . Decreased libido 10/24/2018  . Vitamin D deficiency 10/23/2018  . GERD (gastroesophageal reflux disease) 04/22/2018  . Cervical radiculopathy at C5, chronic 08/19/2017  . Ulnar neuropathy of left upper extremity, mild 08/19/2017  . Bilateral arm weakness 05/18/2017  . Tingling 05/18/2017  . Diabetes ([place]) 10/12/2016  . Neck pain, chronic 05/13/2016  . Asthma 03/24/2016  . Hiatal hernia 03/24/2016  . OSA (obstructive sleep apnea) 01/21/2016  . CKD (chronic kidney disease) stage 4, GFR 15-29 ml/min ([name] 01/31/2015  . High-renin essential hypertension 08/27/2013  . Depression 08/22/2013  . MICROSCOPIC HEMATURIA 10/23/2010  . GOUT 01/10/2009  . Proteinuria 10/09/2008  . Essential hypertension 03/29/2008    Current Outpatient Medications on File Prior to Visit  Medication Sig Dispense Refill  . albuterol (PROAIR HFA) 108 (90 Base) MCG/ACT inhaler Inhale 2 puffs into the lungs every 6 (six) hours as needed for wheezing or shortness of breath. 1 each 5  . allopurinol (ZYLOPRIM) 300  MG tablet Take 1 tablet by mouth once daily 90 tablet 3  . amLODipine (NORVASC) 10 MG tablet Take 1 tablet by mouth once daily 90 tablet 0  . budesonide-formoterol (SYMBICORT) 80-4.5 MCG/ACT inhaler Inhale 2 puffs into the lungs 2 (two) times daily. 1 each 5  . Cholecalciferol (VITAMIN D3 PO) Take by mouth.    . clomiPHENE (CLOMID) 50 MG tablet Take 50 mg by mouth daily.    [name] escitalopram (LEXAPRO) 20 MG tablet Take 1 tablet (20 mg total) by mouth daily. 90 tablet 1  . famotidine (PEPCID) 20 MG tablet Take 2 tablets (40 mg total) by mouth daily.    [name] losartan (COZAAR) 100 MG tablet Take 1 tablet (100 mg total) by mouth daily. 90 tablet 2  . Multiple Vitamin (MULTIVITAMIN ADULT PO) Take by mouth.    . nebivolol (BYSTOLIC) 5 MG tablet Take 1 tablet (5 mg total) by mouth daily. 90 tablet 1  . pravastatin (PRAVACHOL) 10 MG tablet Take 1 tablet (10 mg total) by mouth daily. 30 tablet 5  . Semaglutide (RYBELSUS) 14 MG TABS Take 14 mg by mouth daily before breakfast. 90 tablet 1  . Turmeric 500 MG CAPS Take by mouth daily.    . Vitamin D, Ergocalciferol, (DRISDOL) 1.25 MG (50000 UNIT) CAPS capsule Take 1 capsule (50,000 Units total) by mouth every 7 (seven) days. 4 capsule 0   No current facility-administered medications on file prior to visit.    Past Medical History:  Diagnosis Date  . Asthma   . Constipation   .  Depression   . GERD (gastroesophageal reflux disease)   . Gout    hx of gout  . H/O hiatal hernia   . Hyperlipidemia   . Hypertension   . Prediabetes   . Proteinuria 2005  . Sleep apnea    uses c-pap  . SOB (shortness of breath) on exertion   . Vitamin D deficiency     Past Surgical History:  Procedure Laterality Date  . RENAL BIOPSY  2005   protien in urine, no pathology- [facility]rologist in [facility]  . WISDOM TOOTH EXTRACTION      Social History   Socioeconomic History  . Marital status: Married    Spouse name: Leamon Arnt  . Number of children: 4  . Years of  education: 19  . Highest education level: Not on file  Occupational History  . 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 Smoking status: Former Smoker    Types: Cigars  . Smokeless tobacco: Never Used  . Tobacco comment: occasional ciagar/ last one 2017  Vaping Use  . Vaping Use: Never used  Substance and Sexual Activity  . Alcohol use: Yes    Alcohol/week: 2.0 standard drinks    Types: 2 Glasses of wine per week    Comment: 1-2 glasses on the weekend  . Drug use: No  . Sexual activity: Not on file  Other Topics Concern  . Not on file  Social History Narrative   Low salt diet.  Lives with wife and children in a 2 story home.  Has 2 stepchildren and 2 children of his own.     Works as a third [employer].     Education: college.       Social Determinants of Health   Financial Resource Strain: Not on file  Food Insecurity: Not on file  Transportation Needs: Not on file  Physical Activity: Not on file  Stress: Not on file  Social Connections: Not on file    Family History  Problem Relation Age of Onset  . Hypertension Father   . Parkinson's disease Father   . Depression Father   . Anxiety disorder Father   . Obesity Father   . Parkinsonism Father   . Hypertension Mother   . Hyperlipidemia Mother   . Sleep apnea Mother   . Obesity Mother   . Healthy Brother   . Brain cancer Maternal Grandfather   . Heart attack Paternal Grandfather     Review of Systems  Constitutional: Negative for fever.  Respiratory: Positive for cough (asthma), chest tightness (mild today- asthma related) and shortness of breath (asthma related). Negative for wheezing.   Cardiovascular: Negative for chest pain, palpitations and leg swelling.  Gastrointestinal: Positive for constipation. Negative for abdominal pain.  Neurological: Negative for light-headedness and headaches.       Objective:   Vitals:   12/31/20 1547  BP: 120/78   Pulse: 70  Temp: 98.4 F (36.9 C)  SpO2: 95%   BP Readings from Last 3 Encounters:  12/31/20 120/78  12/24/20 118/71  12/10/20 121/67   Wt Readings from Last 3 Encounters:  12/31/20 254 lb (115.2 kg)  12/24/20 249 lb (112.9 kg)  12/10/20 245 lb (111.1 kg)   Body mass index is 36.45 kg/m.   Physical Exam    Constitutional: Appears well-developed and well-nourished. No distress.  HENT:  Head: Normocephalic and atraumatic.  Neck: Neck supple. No tracheal deviation present. No thyromegaly present.  No cervical lymphadenopathy Cardiovascular: Normal rate, regular rhythm and normal heart sounds.   No murmur heard. No carotid bruit .  No edema Pulmonary/Chest: Effort normal and breath sounds normal. No respiratory distress. No has no wheezes. No rales.  Skin: Skin is warm and dry. Not diaphoretic.  Psychiatric: Normal mood and affect. Behavior is normal.      Assessment & Plan:    See Problem List for Assessment and Plan of chronic medical problems.    This visit occurred during the SARS-CoV-2 public health emergency.  Safety protocols were in place, including screening questions prior to the visit, additional usage of staff PPE, and extensive cleaning of exam room while observing appropriate contact time as indicated for disinfecting solutions.

## 2020-12-31 ENCOUNTER — Encounter: Payer: Self-pay | Admitting: Internal Medicine

## 2020-12-31 ENCOUNTER — Other Ambulatory Visit: Payer: Self-pay

## 2020-12-31 ENCOUNTER — Ambulatory Visit: Payer: BC Managed Care – PPO | Admitting: Internal Medicine

## 2020-12-31 VITALS — BP 120/78 | HR 70 | Temp 98.4°F | Ht 70.0 in | Wt 254.0 lb

## 2020-12-31 DIAGNOSIS — M1A9XX0 Chronic gout, unspecified, without tophus (tophi): Secondary | ICD-10-CM

## 2020-12-31 DIAGNOSIS — E7849 Other hyperlipidemia: Secondary | ICD-10-CM

## 2020-12-31 DIAGNOSIS — J45990 Exercise induced bronchospasm: Secondary | ICD-10-CM

## 2020-12-31 DIAGNOSIS — E785 Hyperlipidemia, unspecified: Secondary | ICD-10-CM

## 2020-12-31 DIAGNOSIS — Z125 Encounter for screening for malignant neoplasm of prostate: Secondary | ICD-10-CM

## 2020-12-31 DIAGNOSIS — E1169 Type 2 diabetes mellitus with other specified complication: Secondary | ICD-10-CM

## 2020-12-31 DIAGNOSIS — I10 Essential (primary) hypertension: Secondary | ICD-10-CM | POA: Diagnosis not present

## 2020-12-31 DIAGNOSIS — N184 Chronic kidney disease, stage 4 (severe): Secondary | ICD-10-CM

## 2020-12-31 DIAGNOSIS — K219 Gastro-esophageal reflux disease without esophagitis: Secondary | ICD-10-CM

## 2020-12-31 DIAGNOSIS — E1122 Type 2 diabetes mellitus with diabetic chronic kidney disease: Secondary | ICD-10-CM | POA: Diagnosis not present

## 2020-12-31 DIAGNOSIS — E559 Vitamin D deficiency, unspecified: Secondary | ICD-10-CM | POA: Diagnosis not present

## 2020-12-31 DIAGNOSIS — F3289 Other specified depressive episodes: Secondary | ICD-10-CM

## 2020-12-31 NOTE — Assessment & Plan Note (Signed)
Chronic Check lipid panel  Continue pravastatin 10 mg daily Regular exercise and healthy diet encouraged

## 2020-12-31 NOTE — Assessment & Plan Note (Signed)
Chronic Taking vitamin D daily Check vitamin D level  

## 2020-12-31 NOTE — Assessment & Plan Note (Signed)
Chronic Controlled, stable Continue Lexapro 20 mg daily  

## 2020-12-31 NOTE — Assessment & Plan Note (Signed)
Chronic With chronic kidney disease, stage IV Sugars have been well controlled Check A1c He is exercising regularly, compliant with a diabetic diet and working on weight loss Continue Rybelsus 14 mg daily

## 2020-12-31 NOTE — Assessment & Plan Note (Signed)
Chronic GERD controlled Continue  pepcid 40 mg daily 

## 2020-12-31 NOTE — Assessment & Plan Note (Signed)
Chronic BP well controlled Continue amlodipine 10 mg daily, losartan 167 mg qd, bystolic 5 mg qd cmp

## 2020-12-31 NOTE — Assessment & Plan Note (Signed)
Chronic Denies any gout symptoms since his last visit Continue allopurinol 300 mg daily 

## 2020-12-31 NOTE — Addendum Note (Signed)
Addended by: Boris Lown B on: 12/31/2020 04:31 PM   Modules accepted: Orders

## 2020-12-31 NOTE — Assessment & Plan Note (Signed)
Chronic CMP 

## 2021-01-01 LAB — CBC WITH DIFFERENTIAL/PLATELET
Basophils Absolute: 0.1 10*3/uL (ref 0.0–0.1)
Basophils Relative: 1.2 % (ref 0.0–3.0)
Eosinophils Absolute: 0.2 10*3/uL (ref 0.0–0.7)
Eosinophils Relative: 2.4 % (ref 0.0–5.0)
HCT: 38 % — ABNORMAL LOW (ref 39.0–52.0)
Hemoglobin: 12.7 g/dL — ABNORMAL LOW (ref 13.0–17.0)
Lymphocytes Relative: 13.3 % (ref 12.0–46.0)
Lymphs Abs: 1 10*3/uL (ref 0.7–4.0)
MCHC: 33.5 g/dL (ref 30.0–36.0)
MCV: 81.8 fl (ref 78.0–100.0)
Monocytes Absolute: 0.9 10*3/uL (ref 0.1–1.0)
Monocytes Relative: 11.3 % (ref 3.0–12.0)
Neutro Abs: 5.5 10*3/uL (ref 1.4–7.7)
Neutrophils Relative %: 71.8 % (ref 43.0–77.0)
Platelets: 167 10*3/uL (ref 150.0–400.0)
RBC: 4.65 Mil/uL (ref 4.22–5.81)
RDW: 15.2 % (ref 11.5–15.5)
WBC: 7.7 10*3/uL (ref 4.0–10.5)

## 2021-01-01 LAB — PSA, TOTAL AND FREE
PSA, % Free: 55 % (calc) (ref >25)
PSA, Free: 0.6 ng/mL
PSA, Total: 1.1 ng/mL (ref <=4.0)

## 2021-01-01 LAB — LIPID PANEL
Cholesterol: 164 mg/dL (ref 0–200)
HDL: 38.9 mg/dL — ABNORMAL LOW (ref >39.00)
NonHDL: 124.6
Total CHOL/HDL Ratio: 4
Triglycerides: 332 mg/dL — ABNORMAL HIGH (ref 0.0–149.0)
VLDL: 66.4 mg/dL — ABNORMAL HIGH (ref 0.0–40.0)

## 2021-01-01 LAB — HEMOGLOBIN A1C: Hgb A1c MFr Bld: 6.4 % (ref 4.6–6.5)

## 2021-01-01 LAB — COMPREHENSIVE METABOLIC PANEL
ALT: 18 U/L (ref 0–53)
AST: 19 U/L (ref 0–37)
Albumin: 3.8 g/dL (ref 3.5–5.2)
Alkaline Phosphatase: 51 U/L (ref 39–117)
BUN: 46 mg/dL — ABNORMAL HIGH (ref 6–23)
CO2: 24 mEq/L (ref 19–32)
Calcium: 8.6 mg/dL (ref 8.4–10.5)
Chloride: 107 mEq/L (ref 96–112)
Creatinine, Ser: 2.76 mg/dL — ABNORMAL HIGH (ref 0.40–1.50)
GFR: 25.41 mL/min — ABNORMAL LOW (ref >60.00)
Glucose, Bld: 164 mg/dL — ABNORMAL HIGH (ref 70–99)
Potassium: 4.2 mEq/L (ref 3.5–5.1)
Sodium: 139 mEq/L (ref 135–145)
Total Bilirubin: 0.2 mg/dL (ref 0.2–1.2)
Total Protein: 5.8 g/dL — ABNORMAL LOW (ref 6.0–8.3)

## 2021-01-01 LAB — VITAMIN D 25 HYDROXY (VIT D DEFICIENCY, FRACTURES): VITD: 46.2 ng/mL (ref 30.00–100.00)

## 2021-01-01 LAB — LDL CHOLESTEROL, DIRECT: Direct LDL: 67 mg/dL

## 2021-01-02 ENCOUNTER — Telehealth: Payer: Self-pay | Admitting: *Deleted

## 2021-01-02 NOTE — Telephone Encounter (Signed)
Patient sent email today   I had a asthma flare up, today, March 23. I know you told me not to use the inhaler more than twice a day. But I used it 4 times because it would help me. Any suggestions or thoughts would be much appreciated. Thank you.  ATC patient unable to reach LM to call back office (x1)

## 2021-01-03 MED ORDER — PREDNISONE 20 MG PO TABS: 20.0000 mg | ORAL_TABLET | Freq: Every day | ORAL | 0 refills | Status: DC

## 2021-01-03 NOTE — Telephone Encounter (Signed)
Called and spoke with pt letting him know the info stated by Aaron Edelman and stated to him that I was going to send Rx for prednisone to pharmacy for him. Pt verbalized understanding. Pt has also been scheduled an appt with TP 3/31 at 2:30. Nothing further needed.

## 2021-01-03 NOTE — Telephone Encounter (Signed)
ATC x1.  Left VM to return our call.

## 2021-01-03 NOTE — Telephone Encounter (Signed)
01/03/2021  Patient recently seen by PCP on 12/31/2020 did not show any audible wheezing based off the physical exam.  Patient reporting that symptoms worsened as of 01/01/2021.  Okay to prescribe:  Prednisone 10mg  tablet  >>>Take 2 tablets (20 mg total) daily for the next 5 days >>> Take with food in the morning  Please schedule patient for an office follow-up visit next week to ensure the symptoms have improved.  If symptoms worsen of the over the weekend patient will need to seek emergent evaluation at urgent care or emergency room.  Wyn Quaker, FNP

## 2021-01-03 NOTE — Telephone Encounter (Signed)
Primary Pulmonologist: Elsworth Soho  Last office visit and with whom: 12/06/20 Elsworth Soho What do we see them for (pulmonary problems): Exercise induced asthma, OSA Last OV assessment/plan:  Assessment & Plan:        Assessment & Plan Note by Rigoberto Noel, MD at 12/06/2020 4:02 PM  Author: Rigoberto Noel, MD Author Type: Physician Filed: 12/06/2020 4:03 PM  Note Status: Written Cosign: Cosign Not Required Encounter Date: 12/06/2020  Problem: OSA on CPAP  Editor: Rigoberto Noel, MD (Physician)               CPAP download was reviewed 11 cm which objectively confirms compliance , no leak and good control of events.  CPAP recently helped improve his daytime somnolence and fatigue       Assessment & Plan Note by Rigoberto Noel, MD at 12/06/2020 4:01 PM  Author: Rigoberto Noel, MD Author Type: Physician Filed: 12/06/2020 4:02 PM  Note Status: Written Cosign: Cosign Not Required Encounter Date: 12/06/2020  Problem: Exercise-induced asthma  Editor: Rigoberto Noel, MD (Physician)               Has persistent symptoms unable to identify trigger in his environment.  No obvious heartburn remains on Pepcid. Reasonable to stay on Symbicort twice daily since this has controlled his medications for at least 3 months and then up attempt stepdown in April. Await PFTs in March  If persistent symptoms on follow-up in 6 months, will proceed with RAST testing       Patient Instructions by Rigoberto Noel, MD at 12/06/2020 3:45 PM  Author: Rigoberto Noel, MD Author Type: Physician Filed: 12/06/2020 3:58 PM  Note Status: Signed Cosign: Cosign Not Required Encounter Date: 12/06/2020  Editor: Rigoberto Noel, MD (Physician)               Stay on symbicort twice daily until April  CPAP is working well on 11 cm         Instructions     Return in about 6 months (around 06/05/2021) for TP/ me.  Stay on symbicort twice daily until April  CPAP is working well on 11 cm       Reason for call: Began  having asthma flare up on 01/01/21, has been using Symbicort 80-4.5 2 puffs twice daily.  He has also been having to use his albuteral inhaler 2 puffs twice daily d/t sob and cough.  The cough is dry and is now causing chest discomfort when he coughs.  He is now trying not to cough d/t the chest discomfort. He states he is getting relief with the use of his inhalers.  He is not taking anything otc for the cough d/t his blood pressure.  He is fully vaccinated against covid.  Denies any fever, chills, body aches.  He denies any sick contacts.  Please advise.  Thank you.  (examples of things to ask: : When did symptoms start? Fever? Cough? Productive? Color to sputum? More sputum than usual? Wheezing? Have you needed increased oxygen? Are you taking your respiratory medications? What over the counter measures have you tried?)  Allergies  Allergen Reactions  . Lisinopril     cough  . Crestor [Rosuvastatin Calcium]     Fingers joint pain  . Wellbutrin [Bupropion]     nightmares    Immunization History  Administered Date(s) Administered  . Influenza Inj Mdck Quad Pf 06/23/2019  . Influenza,inj,Quad PF,6+ Mos 10/13/2016, 07/12/2020  . Influenza,trivalent, recombinat, inj,  PF 08/08/2018  . Influenza-Unspecified 07/06/2015, 08/12/2017  . PFIZER(Purple Top)SARS-COV-2 Vaccination 12/03/2019, 12/29/2019, 08/05/2020  . Pneumococcal Polysaccharide-23 07/14/2019  . Tdap 09/14/2019  . Zoster Recombinat (Shingrix) 07/14/2019, 09/14/2019

## 2021-01-08 ENCOUNTER — Encounter (INDEPENDENT_AMBULATORY_CARE_PROVIDER_SITE_OTHER): Payer: Self-pay | Admitting: Adult Health

## 2021-01-08 ENCOUNTER — Other Ambulatory Visit: Payer: Self-pay

## 2021-01-08 ENCOUNTER — Ambulatory Visit (INDEPENDENT_AMBULATORY_CARE_PROVIDER_SITE_OTHER): Payer: BC Managed Care – PPO | Admitting: Adult Health

## 2021-01-08 VITALS — BP 123/74 | HR 75 | Temp 97.8°F | Ht 70.0 in | Wt 246.0 lb

## 2021-01-08 DIAGNOSIS — R61 Generalized hyperhidrosis: Secondary | ICD-10-CM

## 2021-01-08 DIAGNOSIS — E559 Vitamin D deficiency, unspecified: Secondary | ICD-10-CM | POA: Diagnosis not present

## 2021-01-08 DIAGNOSIS — E1169 Type 2 diabetes mellitus with other specified complication: Secondary | ICD-10-CM

## 2021-01-08 DIAGNOSIS — E1159 Type 2 diabetes mellitus with other circulatory complications: Secondary | ICD-10-CM

## 2021-01-08 DIAGNOSIS — Z6838 Body mass index (BMI) 38.0-38.9, adult: Secondary | ICD-10-CM

## 2021-01-08 DIAGNOSIS — E785 Hyperlipidemia, unspecified: Secondary | ICD-10-CM

## 2021-01-08 DIAGNOSIS — I152 Hypertension secondary to endocrine disorders: Secondary | ICD-10-CM

## 2021-01-09 ENCOUNTER — Ambulatory Visit: Payer: BC Managed Care – PPO | Admitting: Adult Health

## 2021-01-09 ENCOUNTER — Encounter: Payer: Self-pay | Admitting: Adult Health

## 2021-01-09 DIAGNOSIS — G4733 Obstructive sleep apnea (adult) (pediatric): Secondary | ICD-10-CM | POA: Diagnosis not present

## 2021-01-09 DIAGNOSIS — J4540 Moderate persistent asthma, uncomplicated: Secondary | ICD-10-CM | POA: Diagnosis not present

## 2021-01-09 NOTE — Assessment & Plan Note (Signed)
Recent asthma flare now improved and back to baseline.  PFTs show moderate restriction and obstruction.  Would continue on Symbicort.  Continue on trigger prevention.  Will control GERD and chronic rhinitis.  Plan  Patient Instructions  Continue on Symbicort 2 puffs Twice daily, rinse after use.  Albuterol inhaler As needed   Asthma action plan .  Increase Pepcid 20mg  Twice daily  .  Continue on Zyrtec 10mg  daily  Continue on CPAP At bedtime  .  Follow up with Dr. Elsworth Soho  In in 3-4 months and As needed

## 2021-01-09 NOTE — Assessment & Plan Note (Signed)
Healthy weight loss discussed 

## 2021-01-09 NOTE — Assessment & Plan Note (Signed)
Controlled on CPAP no changes

## 2021-01-09 NOTE — Progress Notes (Signed)
@Patient  ID: Frederick Fox, male    DOB: 07-30-1966, 55 y.o.   MRN: [id number]  Chief Complaint  Patient presents with  . Follow-up    Referring provider: Binnie Rail, MD  HPI: 55 year old male followed for obstructive sleep apnea and exercise-induced asthma Has chronic kidney disease stage IV  TEST/EVENTS :   01/09/2021 Follow up : Asthma  Patient returns for a 1 month follow-up.  Patient was seen last visit after some intermittent episodes of asthma flare and chronic cough.  Patient says that cough has been better but had a another asthma flare last week.  Was called in prednisone 20 mg daily for 5 days.  Breathing has returned back to normal.  He denies any increased cough or wheezing. He has been started on Symbicort twice daily. Feels it has helped. Denies current cough or wheezing .  Patient was set up for pulmonary function testing completed on December 12, 2020 that showed moderate restriction with minimal airflow obstruction.  FEV1 was 63%, ratio 71, FVC 68%, no significant bronchodilator response, DLCO 78%.  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 Will be out in 2 months.   Working on being active , wants to lose weight .     Allergies  Allergen Reactions  . Lisinopril     cough  . Crestor [Rosuvastatin Calcium]     Fingers joint pain  . Wellbutrin [Bupropion]     nightmares    Immunization History  Administered Date(s) Administered  . Influenza Inj Mdck Quad Pf 06/23/2019  . Influenza,inj,Quad PF,6+ Mos 10/13/2016, 07/12/2020  . Influenza,trivalent, recombinat, inj, PF 08/08/2018  . Influenza-Unspecified 07/06/2015, 08/12/2017  . PFIZER(Purple Top)SARS-COV-2 Vaccination 12/03/2019, 12/29/2019, 08/05/2020  . Pneumococcal Polysaccharide-23 07/14/2019  . Tdap 09/14/2019  . Zoster Recombinat (Shingrix) 07/14/2019, 09/14/2019    Past Medical History:  Diagnosis Date  . Asthma   . Constipation   . Depression   . GERD (gastroesophageal  reflux disease)   . Gout    hx of gout  . H/O hiatal hernia   . Hyperlipidemia   . Hypertension   . Prediabetes   . Proteinuria 2005  . Sleep apnea    uses c-pap  . SOB (shortness of breath) on exertion   . Vitamin D deficiency     Tobacco History: Social History   Tobacco Use  Smoking Status Former Smoker  . Types: Cigars  Smokeless Tobacco Never Used  Tobacco Comment   occasional ciagar/ last one 2017   Counseling given: Not Answered Comment: occasional ciagar/ last one 2017   Outpatient Medications Prior to Visit  Medication Sig Dispense Refill  . albuterol (PROAIR HFA) 108 (90 Base) MCG/ACT inhaler Inhale 2 puffs into the lungs every 6 (six) hours as needed for wheezing or shortness of breath. 1 each 5  . allopurinol (ZYLOPRIM) 300 MG tablet Take 1 tablet by mouth once daily 90 tablet 3  . amLODipine (NORVASC) 10 MG tablet Take 1 tablet by mouth once daily 90 tablet 0  . budesonide-formoterol (SYMBICORT) 80-4.5 MCG/ACT inhaler Inhale 2 puffs into the lungs 2 (two) times daily. 1 each 5  . Cholecalciferol (VITAMIN D3 PO) Take by mouth.    . clomiPHENE (CLOMID) 50 MG tablet Take 50 mg by mouth daily.    [name] escitalopram (LEXAPRO) 20 MG tablet Take 1 tablet (20 mg total) by mouth daily. 90 tablet 1  . famotidine (PEPCID) 20 MG tablet Take 2 tablets (40 mg total) by mouth daily.    [name]  losartan (COZAAR) 100 MG tablet Take 1 tablet (100 mg total) by mouth daily. 90 tablet 2  . Multiple Vitamin (MULTIVITAMIN ADULT PO) Take by mouth.    . nebivolol (BYSTOLIC) 5 MG tablet Take 1 tablet (5 mg total) by mouth daily. 90 tablet 1  . pravastatin (PRAVACHOL) 10 MG tablet Take 1 tablet (10 mg total) by mouth daily. 30 tablet 5  . Semaglutide (RYBELSUS) 14 MG TABS Take 14 mg by mouth daily before breakfast. 90 tablet 1  . Turmeric 500 MG CAPS Take by mouth daily.    . Vitamin D, Ergocalciferol, (DRISDOL) 1.25 MG (50000 UNIT) CAPS capsule Take 1 capsule (50,000 Units total) by mouth every  7 (seven) days. 4 capsule 0  . predniSONE (DELTASONE) 20 MG tablet Take 1 tablet (20 mg total) by mouth daily with breakfast. (Patient not taking: Reported on 01/09/2021) 5 tablet 0   No facility-administered medications prior to visit.     Review of Systems:   Constitutional:   No  weight loss, night sweats,  Fevers, chills,  +fatigue, or  lassitude.  HEENT:   No headaches,  Difficulty swallowing,  Tooth/dental problems, or  Sore throat,                No sneezing, itching, ear ache, nasal congestion, post nasal drip,   CV:  No chest pain,  Orthopnea, PND, swelling in lower extremities, anasarca, dizziness, palpitations, syncope.   GI  No heartburn, indigestion, abdominal pain, nausea, vomiting, diarrhea, change in bowel habits, loss of appetite, bloody stools.   Resp:    No chest wall deformity  Skin: no rash or lesions.  GU: no dysuria, change in color of urine, no urgency or frequency.  No flank pain, no hematuria   MS:  No joint pain or swelling.  No decreased range of motion.  No back pain.    Physical Exam  BP 110/80 (BP Location: Left Arm, Patient Position: Sitting, Cuff Size: Large)   Pulse 79   Temp 97.8 F (36.6 C) (Temporal)   Ht 5\' 10"  (1.778 m)   Wt 253 lb (114.8 kg)   SpO2 97%   BMI 36.30 kg/m   GEN: A/Ox3; pleasant , NAD, well nourished    HEENT:  [place]/AT,  , NOSE-clear, THROAT-clear, no lesions, no postnasal drip or exudate noted.   NECK:  Supple w/ fair ROM; no JVD; normal carotid impulses w/o bruits; no thyromegaly or nodules palpated; no lymphadenopathy.    RESP  Clear  P & A; w/o, wheezes/ rales/ or rhonchi. no accessory muscle use, no dullness to percussion  CARD:  RRR, no m/r/g, no peripheral edema, pulses intact, no cyanosis or clubbing.  GI:   Soft & nt; nml bowel sounds; no organomegaly or masses detected.   Musco: Warm bil, no deformities or joint swelling noted.   Neuro: alert, no focal deficits noted.    Skin: Warm, no lesions or  rashes    Lab Results:  CBC   BNP No results found for: BNP  ProBNP No results found for: PROBNP  Imaging: No results found.    PFT Results Latest Ref Rng & Units 12/12/2020 12/02/2020  FVC-Predicted Pre % 65 65  FVC-Post L 3.31 3.31  FVC-Predicted Post % 68 68  Pre FEV1/FVC % % 70 70  Post FEV1/FCV % % 71 71  FEV1-Pre L 2.21 2.21  FEV1-Predicted Pre % 59 59  FEV1-Post L 2.36 2.36  DLCO uncorrected ml/min/mmHg 21.89 21.89  DLCO UNC% %  78 78  DLCO corrected ml/min/mmHg 21.89 21.89  DLCO COR %Predicted % 78 78  DLVA Predicted % 96 96  TLC L 5.59 5.59  TLC % Predicted % 82 82  RV % Predicted % 121 121    No results found for: NITRICOXIDE      Assessment & Plan:   Asthma Recent asthma flare now improved and back to baseline.  PFTs show moderate restriction and obstruction.  Would continue on Symbicort.  Continue on trigger prevention.  Will control GERD and chronic rhinitis.  Plan  Patient Instructions  Continue on Symbicort 2 puffs Twice daily, rinse after use.  Albuterol inhaler As needed   Asthma action plan .  Increase Pepcid 20mg  Twice daily  .  Continue on Zyrtec 10mg  daily  Continue on CPAP At bedtime  .  Follow up with Dr. Elsworth Soho  In in 3-4 months and As needed        OSA (obstructive sleep apnea) Controlled on CPAP no changes  Class 2 obesity with alveolar hypoventilation and body mass index (BMI) of 35.0 to 35.9 in adult [facility]) Healthy weight loss discussed     Rexene Edison, NP 01/09/2021

## 2021-01-09 NOTE — Patient Instructions (Addendum)
Continue on Symbicort 2 puffs Twice daily, rinse after use.  Albuterol inhaler As needed   Asthma action plan .  Increase Pepcid 20mg  Twice daily  .  Continue on Zyrtec 10mg  daily  Continue on CPAP At bedtime  .  Follow up with Dr. Elsworth Soho  In in 3-4 months and As needed

## 2021-01-13 DIAGNOSIS — R61 Generalized hyperhidrosis: Secondary | ICD-10-CM | POA: Insufficient documentation

## 2021-01-13 NOTE — Progress Notes (Signed)
Chief Complaint:   OBESITY Frederick Fox is here to discuss his progress with his obesity treatment plan along with follow-up of his obesity related diagnoses. [name] is on the Category 3 Plan and states he is following his eating plan approximately 100% of the time. [name] states he is biking and hiking 30-40 minutes 5 times per week.  Today's visit was #: 10 Starting weight: 256 lbs Starting date: 08/06/2020 Today's weight: 246 lbs Today's date: 01/08/2021 Total lbs lost to date: 10 lbs Total lbs lost since last in-office visit: 3 lbs  Interim History: Frederick Fox was seen by his PCP on 12/31/2020 for a chronic medication f/u with labs. Labs were reviewed with pt. He continues to enjoy foods and structure of category 3 meal plan. Ultimate goal is to lose down to 200 lb with BMI 28.7.  Subjective:   1. Hypertension associated with type 2 diabetes mellitus ([place]) Discussed labs with patient today. BP/HR are at goal at [place]. He denies cardiac symptoms. 12/31/2020 CMP is stable.  BP Readings from Last 3 Encounters:  01/09/21 110/80  01/08/21 123/74  12/31/20 120/78    2. Hyperlipidemia associated with type 2 diabetes mellitus ([place]) Discussed labs with patient today. [name]'s 12/31/2020 lipid panel revealed LDL 67- at goal. All other levels at goal with exception of triglycerides of 332 (down from 365 on 06/04/2020). He is on pravastatin 10 mg QD and denies myalgias.  Lab Results  Component Value Date   ALT 18 12/31/2020   AST 19 12/31/2020   ALKPHOS 51 12/31/2020   BILITOT 0.2 12/31/2020   Lab Results  Component Value Date   CHOL 164 12/31/2020   HDL 38.90 (L) 12/31/2020   LDLCALC 111 (H) 06/04/2020   LDLDIRECT 67.0 12/31/2020   TRIG 332.0 (H) 12/31/2020   CHOLHDL 4 12/31/2020    3. Type 2 diabetes mellitus with other specified complication, without long-term current use of insulin ([place]) Discussed labs with patient today. Frederick Fox's 12/31/2020 A1c 6.4- at goal. He is only on Rybelsus 14  mg QD.   Lab Results  Component Value Date   HGBA1C 6.4 12/31/2020   HGBA1C 5.7 (H) 10/14/2020   HGBA1C 6.0 (H) 06/04/2020   Lab Results  Component Value Date   MICROALBUR 251.7 (H) 10/09/2008   LDLCALC 111 (H) 06/04/2020   CREATININE 2.76 (H) 12/31/2020   Lab Results  Component Value Date   INSULIN 42.0 (H) 08/06/2020    4. Vitamin D deficiency Discussed labs with patient today. [name]'s Vitamin D level was below goal of 50, at 46.20 on 12/31/2020. He is currently taking prescription vitamin D 50,000 IU each week. He denies nausea, vomiting or muscle weakness.   Ref. Range 12/31/2020 16:31  VITD Latest Ref Range: 30.00 - 100.00 ng/mL 46.20   5. Unexplained night sweats Frederick Fox is experiencing pronounced night sweats during sleep since 01/03/2021. Sweats have occurred nightly since then- today is 01/08/2021. He denies recent travel outside the [name].  He denies known exposure to TB.   He has not visited anyone incarcerated recently. 12/31/20 CBC- WBC was normal.  Assessment/Plan:   1. Hypertension associated with type 2 diabetes mellitus ([name]) [name] is working on healthy weight loss and exercise to improve blood pressure control. We will watch for signs of hypotension as he continues his lifestyle modifications. Continue current anti-hypertensive regimen.  2. Hyperlipidemia associated with type 2 diabetes mellitus ([place]) Cardiovascular risk and specific lipid/LDL goals reviewed.  We discussed several lifestyle modifications today and [name] will continue  to work on diet, exercise and weight loss efforts. Orders and follow up as documented in patient record. Follow up with PCP regarding elevated triglycerides.  Counseling Intensive lifestyle modifications are the first line treatment for this issue. . Dietary changes: Increase soluble fiber. Decrease simple carbohydrates. . Exercise changes: Moderate to vigorous-intensity aerobic activity 150 minutes per week if  tolerated. . Lipid-lowering medications: see documented in medical record.  3. Type 2 diabetes mellitus with other specified complication, without long-term current use of insulin ([name] Good blood sugar control is important to decrease the likelihood of diabetic complications such as nephropathy, neuropathy, limb loss, blindness, coronary artery disease, and death. Intensive lifestyle modification including diet, exercise and weight loss are the first line of treatment for diabetes. Continue oral GLP-1- managed by PCP.  4. Vitamin D deficiency Low Vitamin D level contributes to fatigue and are associated with obesity, breast, and colon cancer. He agrees to continue to take prescription Vitamin D @50 ,000 IU every week and will follow-up for routine testing of Vitamin D, at least 2-3 times per year to avoid over-replacement.  5. Unexplained night sweats Monitor and if symptoms do not improve, follow up with PCP.  6. Current BMI 35.4 Frederick Fox is currently in the action stage of change. As such, his goal is to continue with weight loss efforts. He has agreed to the Category 3 Plan.   Exercise goals: As is  Behavioral modification strategies: increasing lean protein intake, decreasing simple carbohydrates, meal planning and cooking strategies and planning for success.  Frederick Fox has agreed to follow-up with our clinic in 2-3 weeks. He was informed of the importance of frequent follow-up visits to maximize his success with intensive lifestyle modifications for his multiple health conditions.   Objective:   Blood pressure 123/74, pulse 75, temperature 97.8 F (36.6 C), height 5\' 10"  (1.778 m), weight 246 lb (111.6 kg), SpO2 96 %. Body mass index is 35.3 kg/m.  General: Cooperative, alert, well developed, in no acute distress. HEENT: Conjunctivae and lids unremarkable. Cardiovascular: Regular rhythm.  Lungs: Normal work of breathing. Neurologic: No focal deficits.   Lab Results  Component Value  Date   CREATININE 2.76 (H) 12/31/2020   BUN 46 (H) 12/31/2020   NA 139 12/31/2020   K 4.2 12/31/2020   CL 107 12/31/2020   CO2 24 12/31/2020   Lab Results  Component Value Date   ALT 18 12/31/2020   AST 19 12/31/2020   ALKPHOS 51 12/31/2020   BILITOT 0.2 12/31/2020   Lab Results  Component Value Date   HGBA1C 6.4 12/31/2020   HGBA1C 5.7 (H) 10/14/2020   HGBA1C 6.0 (H) 06/04/2020   HGBA1C 6.1 12/06/2019   HGBA1C 6.4 07/14/2019   Lab Results  Component Value Date   INSULIN 42.0 (H) 08/06/2020   Lab Results  Component Value Date   TSH 1.060 08/06/2020   Lab Results  Component Value Date   CHOL 164 12/31/2020   HDL 38.90 (L) 12/31/2020   LDLCALC 111 (H) 06/04/2020   LDLDIRECT 67.0 12/31/2020   TRIG 332.0 (H) 12/31/2020   CHOLHDL 4 12/31/2020   Lab Results  Component Value Date   WBC 7.7 12/31/2020   HGB 12.7 (L) 12/31/2020   HCT 38.0 (L) 12/31/2020   MCV 81.8 12/31/2020   PLT 167.0 12/31/2020   Lab Results  Component Value Date   IRON 33 09/09/2020   TIBC 276 09/09/2020   FERRITIN 59 09/09/2020    Attestation Statements:   Reviewed by clinician on  day of visit: allergies, medications, problem list, medical history, surgical history, family history, social history, and previous encounter notes.  Time spent on visit including pre-visit chart review and post-visit care and charting was 33 minutes.   Coral Ceo, am acting as Location manager for Mina Marble, NP.  I have reviewed the above documentation for accuracy and completeness, and I agree with the above. -  Christo Hain d. Balen Woolum, NP-C

## 2021-01-14 ENCOUNTER — Encounter: Payer: Self-pay | Admitting: Internal Medicine

## 2021-01-15 ENCOUNTER — Other Ambulatory Visit: Payer: Self-pay | Admitting: Internal Medicine

## 2021-01-17 ENCOUNTER — Other Ambulatory Visit: Payer: Self-pay | Admitting: *Deleted

## 2021-01-17 MED ORDER — PREDNISONE 20 MG PO TABS: 20.0000 mg | ORAL_TABLET | Freq: Every day | ORAL | 0 refills | Status: DC

## 2021-01-17 NOTE — Telephone Encounter (Signed)
Please advise on patient mychart message  Is it possible to go through another round of prednisone, for this cough that is not leaving me. Nothing is coming up when I cough, its a dry cough. Thanks and have a nice weekend.

## 2021-01-17 NOTE — Telephone Encounter (Signed)
Can have Prednisone 20mg  daily for 5 days ,  Has been having frequent flares lately , if not improving will need ov to further evaluate why and see if additional therapies are indicated. /testing needed.   Please contact office for sooner follow up if symptoms do not improve or worsen or seek emergency care

## 2021-01-19 ENCOUNTER — Other Ambulatory Visit: Payer: Self-pay | Admitting: Internal Medicine

## 2021-01-27 DIAGNOSIS — R059 Cough, unspecified: Secondary | ICD-10-CM

## 2021-01-27 NOTE — Telephone Encounter (Signed)
mychart message sent by pt which is posted below:  Persistent cough 01/27/2021 10:28 AM Reply   To: [facility]    From: Laroy Apple    Created: 01/27/2021 10:28 AM     *-*-*This message was handled on 01/27/2021 10:43 AM by Arra Connaughton P*-*-*  After the prednisone treatments, you mentioned getting a CT scan as the next step of my treatment. I think we need to do that. This cough I have is not going away plus when I take a deep breath my chest hurts. Which I know could be the coughing. If you have any other thoughts that would be appreciated. Thank you      Frederick Fox, please advise.

## 2021-01-28 NOTE — Telephone Encounter (Signed)
RA please advise since TP is out of the office this week.  Thanks  mychart message sent by pt which is posted below:  Persistent cough 01/27/2021 10:28 AM Reply   To: [facility]    From: Frederick Fox    Created: 01/27/2021 10:28 AM      *-*-*This message was handled on 01/27/2021 10:43 AM by PINION, EMILY P*-*-*   After the prednisone treatments, you mentioned getting a CT scan as the next step of my treatment. I think we need to do that. This cough I have is not going away plus when I take a deep breath my chest hurts. Which I know could be the coughing. If you have any other thoughts that would be appreciated. Thank you

## 2021-01-29 NOTE — Telephone Encounter (Signed)
He was seen on 3/31 and given prednisone 20 mg for 5 days on 4/8.  Coughing is persistent. Previously this has been attributed to cough variant asthma and GERD. Please confirm that he is compliant with Symbicort twice daily and PPI.  Would recommend that he double up on his PPI and take all precautions for GERD with small meals and avoid foods that increase reflux.  CT scan would be a low yield test for him but previous chest x-ray showing some interstitial thickening so we can proceed with high-resolution CT chest without contrast to rule out ILD

## 2021-01-29 NOTE — Telephone Encounter (Signed)
Can set up for HRCT chest - re chronic cough .   Make sure he has a follow up in in next couple of weeks to review and go in person office visit

## 2021-02-03 ENCOUNTER — Other Ambulatory Visit: Payer: Self-pay

## 2021-02-03 ENCOUNTER — Ambulatory Visit (INDEPENDENT_AMBULATORY_CARE_PROVIDER_SITE_OTHER): Payer: BC Managed Care – PPO | Admitting: Adult Health

## 2021-02-03 ENCOUNTER — Encounter (INDEPENDENT_AMBULATORY_CARE_PROVIDER_SITE_OTHER): Payer: Self-pay | Admitting: Adult Health

## 2021-02-03 VITALS — BP 126/71 | HR 69 | Temp 99.0°F | Ht 70.0 in | Wt 248.0 lb

## 2021-02-03 DIAGNOSIS — E662 Morbid (severe) obesity with alveolar hypoventilation: Secondary | ICD-10-CM

## 2021-02-03 DIAGNOSIS — E1169 Type 2 diabetes mellitus with other specified complication: Secondary | ICD-10-CM

## 2021-02-03 DIAGNOSIS — E559 Vitamin D deficiency, unspecified: Secondary | ICD-10-CM | POA: Diagnosis not present

## 2021-02-03 DIAGNOSIS — Z6835 Body mass index (BMI) 35.0-35.9, adult: Secondary | ICD-10-CM

## 2021-02-03 DIAGNOSIS — Z9189 Other specified personal risk factors, not elsewhere classified: Secondary | ICD-10-CM | POA: Diagnosis not present

## 2021-02-03 DIAGNOSIS — J4540 Moderate persistent asthma, uncomplicated: Secondary | ICD-10-CM

## 2021-02-04 NOTE — Progress Notes (Signed)
Chief Complaint:   OBESITY Frederick Fox is here to discuss his progress with his obesity treatment plan along with follow-up of his obesity related diagnoses. Frederick Fox is on the Category 3 Plan and states he is following his eating plan approximately 75-80% of the time. Frederick Fox states he is not currently exercising.  Today's visit was #: 11 Starting weight: 256 lbs Starting date: 08/06/2020 Today's weight: 248 lbs Today's date: 02/03/2021 Total lbs lost to date: 8 Total lbs lost since last in-office visit: 0  Interim History: Frederick Fox reports increased stress eating due to worsening asthma. He is scheduled for a high resolution CT 02/11/2021- with a follow up with pulmonology.  He can feel overwhelmed at times with all of his medical conditions/co-morbidities. He plans on establishing with a new therapist.   Subjective:   1. Vitamin D deficiency Discussed labs with patient today. Frederick Fox's Vitamin D level was 46.20 on 12/31/2020, which is below goal of 50. He is currently taking prescription vitamin D 50,000 IU each week. He denies nausea, vomiting or muscle weakness.  2. Type 2 diabetes mellitus with other specified complication, without long-term current use of insulin (Frederick Fox) Discussed labs with patient today. 12/31/2020 A1c 6.4, increased from 5.7 on 10/14/2020. Frederick Fox is on Rybelsus 14 mg daily with breakfast and tolerating it well.  Lab Results  Component Value Date   HGBA1C 6.4 12/31/2020   HGBA1C 5.7 (H) 10/14/2020   HGBA1C 6.0 (H) 06/04/2020   Lab Results  Component Value Date   MICROALBUR 251.7 (H) 10/09/2008   LDLCALC 111 (H) 06/04/2020   CREATININE 2.76 (H) 12/31/2020   Lab Results  Component Value Date   INSULIN 42.0 (H) 08/06/2020    3. Moderate persistent asthma without complication Frederick Fox denies tobacco/vape use. He reports persistent cough, wheeze, and reduced ability to exercise.  Recent treatment plan from Pulmonology: Continue on Symbicort 2 puffs Twice daily, rinse  after use.  Albuterol inhaler As needed   Asthma action plan .  Increase Pepcid 20mg  Twice daily  .  Continue on Zyrtec 10mg  daily  Continue on CPAP At bedtime  .  He is scheduled for a high resolution CT 02/11/2021- will then follow up with pulmonology.  4. At risk for osteoporosis Frederick Fox is at higher risk of osteopenia and osteoporosis due to Vitamin D deficiency and obesity.  Assessment/Plan:   1. Vitamin D deficiency Low Vitamin D level contributes to fatigue and are associated with obesity, breast, and colon cancer. He agrees to continue to take prescription Vitamin D @50 ,000 IU every week and will follow-up for routine testing of Vitamin D, at least 2-3 times per year to avoid over-replacement.  2. Type 2 diabetes mellitus with other specified complication, without long-term current use of insulin ([name] Good blood sugar control is important to decrease the likelihood of diabetic complications such as nephropathy, neuropathy, limb loss, blindness, coronary artery disease, and death. Intensive lifestyle modification including diet, exercise and weight loss are the first line of treatment for diabetes. Continue Rybelsus.  3. Moderate persistent asthma without complication Reviewed 0/05/6760 CXR with pt. Keep high resolution CT appointment and follow up with pulmonology as directed.  Continue current medication per pulmonology.  4. At risk for osteoporosis Frederick Fox was given approximately 15 minutes of osteoporosis prevention counseling today. Frederick Fox is at risk for osteopenia and osteoporosis due to his Vitamin D deficiency. He was encouraged to take his Vitamin D and follow his higher calcium diet and increase strengthening exercise to help strengthen his  bones and decrease his risk of osteopenia and osteoporosis.  Repetitive spaced learning was employed today to elicit superior memory formation and behavioral change.  5. Class 2 obesity with alveolar hypoventilation and body mass index  (BMI) of 35.0 to 35.9 in adult, unspecified whether serious comorbidity present [facility]) Frederick Fox is currently in the action stage of change. As such, his goal is to continue with weight loss efforts. He has agreed to the Category 3 Plan.   Exercise goals: Walking 30-45 minutes  Behavioral modification strategies: increasing lean protein intake, decreasing simple carbohydrates, meal planning and cooking strategies and planning for success.  Frederick Fox has agreed to follow-up with our clinic in 2 weeks. He was informed of the importance of frequent follow-up visits to maximize his success with intensive lifestyle modifications for his multiple health conditions.   Objective:   Blood pressure 126/71, pulse 69, temperature 99 F (37.2 C), height 5\' 10"  (1.778 m), weight 248 lb (112.5 kg), SpO2 98 %. Body mass index is 35.58 kg/m.  General: Cooperative, alert, well developed, in no acute distress. HEENT: Conjunctivae and lids unremarkable. Cardiovascular: Regular rhythm.  Lungs: Normal work of breathing. Neurologic: No focal deficits.   Lab Results  Component Value Date   CREATININE 2.76 (H) 12/31/2020   BUN 46 (H) 12/31/2020   NA 139 12/31/2020   K 4.2 12/31/2020   CL 107 12/31/2020   CO2 24 12/31/2020   Lab Results  Component Value Date   ALT 18 12/31/2020   AST 19 12/31/2020   ALKPHOS 51 12/31/2020   BILITOT 0.2 12/31/2020   Lab Results  Component Value Date   HGBA1C 6.4 12/31/2020   HGBA1C 5.7 (H) 10/14/2020   HGBA1C 6.0 (H) 06/04/2020   HGBA1C 6.1 12/06/2019   HGBA1C 6.4 07/14/2019   Lab Results  Component Value Date   INSULIN 42.0 (H) 08/06/2020   Lab Results  Component Value Date   TSH 1.060 08/06/2020   Lab Results  Component Value Date   CHOL 164 12/31/2020   HDL 38.90 (L) 12/31/2020   LDLCALC 111 (H) 06/04/2020   LDLDIRECT 67.0 12/31/2020   TRIG 332.0 (H) 12/31/2020   CHOLHDL 4 12/31/2020   Lab Results  Component Value Date   WBC 7.7 12/31/2020   HGB  12.7 (L) 12/31/2020   HCT 38.0 (L) 12/31/2020   MCV 81.8 12/31/2020   PLT 167.0 12/31/2020   Lab Results  Component Value Date   IRON 33 09/09/2020   TIBC 276 09/09/2020   FERRITIN 59 09/09/2020     Attestation Statements:   Reviewed by clinician on day of visit: allergies, medications, problem list, medical history, surgical history, family history, social history, and previous encounter notes.  Coral Ceo, am acting as Location manager for Mina Marble, NP.  I have reviewed the above documentation for accuracy and completeness, and I agree with the above. -  Merita Hawks d. Arian Mcquitty, NP-C

## 2021-02-07 ENCOUNTER — Encounter (INDEPENDENT_AMBULATORY_CARE_PROVIDER_SITE_OTHER): Payer: Self-pay | Admitting: Adult Health

## 2021-02-10 ENCOUNTER — Other Ambulatory Visit: Payer: Self-pay | Admitting: Urology

## 2021-02-10 ENCOUNTER — Other Ambulatory Visit (HOSPITAL_COMMUNITY): Payer: Self-pay | Admitting: Urology

## 2021-02-10 ENCOUNTER — Other Ambulatory Visit (INDEPENDENT_AMBULATORY_CARE_PROVIDER_SITE_OTHER): Payer: Self-pay | Admitting: Adult Health

## 2021-02-10 DIAGNOSIS — R311 Benign essential microscopic hematuria: Secondary | ICD-10-CM

## 2021-02-10 DIAGNOSIS — E559 Vitamin D deficiency, unspecified: Secondary | ICD-10-CM

## 2021-02-10 MED ORDER — VITAMIN D (ERGOCALCIFEROL) 1.25 MG (50000 UNIT) PO CAPS: 50000.0000 [IU] | ORAL_CAPSULE | ORAL | 0 refills | Status: DC

## 2021-02-10 NOTE — Telephone Encounter (Signed)
For you -

## 2021-02-11 ENCOUNTER — Ambulatory Visit (INDEPENDENT_AMBULATORY_CARE_PROVIDER_SITE_OTHER)
Admission: RE | Admit: 2021-02-11 | Discharge: 2021-02-11 | Disposition: A | Discharge status: Home / Self Care | Payer: BC Managed Care – PPO | Source: Ambulatory Visit | Attending: Adult Health | Admitting: Adult Health

## 2021-02-11 ENCOUNTER — Other Ambulatory Visit: Payer: Self-pay

## 2021-02-11 DIAGNOSIS — R059 Cough, unspecified: Secondary | ICD-10-CM

## 2021-02-13 ENCOUNTER — Encounter: Payer: Self-pay | Admitting: Adult Health

## 2021-02-13 ENCOUNTER — Other Ambulatory Visit: Payer: Self-pay

## 2021-02-13 ENCOUNTER — Ambulatory Visit: Payer: BC Managed Care – PPO | Admitting: Adult Health

## 2021-02-13 DIAGNOSIS — G4733 Obstructive sleep apnea (adult) (pediatric): Secondary | ICD-10-CM

## 2021-02-13 DIAGNOSIS — J4540 Moderate persistent asthma, uncomplicated: Secondary | ICD-10-CM | POA: Diagnosis not present

## 2021-02-13 DIAGNOSIS — R0602 Shortness of breath: Secondary | ICD-10-CM | POA: Diagnosis not present

## 2021-02-13 NOTE — Assessment & Plan Note (Signed)
Recent flare and cough.  Cough has improved with Tessalon and short course of prednisone.  Also with GERD controlled.  Continue on Symbicort for now. Recent chest x-ray/CT chest without acute findings.  Plan  Patient Instructions  Refer to Cardiology .  Continue on Symbicort 2 puffs Twice daily, rinse after use.  Albuterol inhaler As needed   Asthma action plan .  Pepcid 20mg  Twice daily  .  Continue on Zyrtec 10mg  daily  Continue on CPAP At bedtime  .  Do not drive if sleepy .  Follow up with Dr. Elsworth Soho  In in 4-6  months and As needed

## 2021-02-13 NOTE — Progress Notes (Signed)
Patient in office for visit to discuss CT results.  Nothing further needed.

## 2021-02-13 NOTE — Progress Notes (Signed)
Patient has OV today, 02/13/21 with Rexene Edison NP, will discuss results of CT at visit.

## 2021-02-13 NOTE — Progress Notes (Signed)
@Patient  ID: Frederick Fox, male    DOB: [birth year], 55 y.o.   MRN: [id number]  Chief Complaint  Patient presents with  . Follow-up    Referring provider: Binnie Rail, MD  HPI: 55 year old male followed for obstructive sleep apnea and exercise-induced asthma Chronic kidney disease stage IV, DM , HTN and Hyperlipidemia  Patient is a school teacher  TEST/EVENTS :  pulmonary function testing completed on December 12, 2020 that showed moderate restriction with minimal airflow obstruction.  FEV1 was 63%, ratio 71, FVC 68%, no significant bronchodilator response, DLCO 78%.  02/13/2021 Follow up : OSA and Asthma  Patient returns for a 6-week follow-up.  Patient has underlying obstructive sleep apnea is on nocturnal CPAP.  Patient says he is doing well on CPAP.  He denies any daytime sleepiness.  Feels that he benefits from CPAP.  CPAP download shows excellent compliance with daily average usage at 7.5 hours.  AHI 0.6.  Patient is on CPAP 11 cm H2O.  Last visit patient developed a cough shortly after being seen in the office.  He was called in prednisone 20 mg for 5 days.  Previous chest x-ray in January this year showed bronchial wall thickening.  Patient continued to have ongoing cough.  Pepcid was increased and tessalon added. He was set up for high-resolution CT chest that was completed on Feb 12, 2021 that showed no evidence of interstitial lung disease and was normal .  He remains on Symbicort twice daily. Cough has improved . Main issues is gets short of breath with activity -worse for last 3 months , occurs with  exercise. Has some chest tightness with riding bike that resolves with rest. No syncope, palpitations, radiating pains, or diaphoresis .  No dyspnea at rest.  Hx of HTN, DM  and Hyperlipidemia . No FH of heart disease.    Allergies  Allergen Reactions  . Lisinopril     cough  . Crestor [Rosuvastatin Calcium]     Fingers joint pain  . Wellbutrin [Bupropion]      nightmares    Immunization History  Administered Date(s) Administered  . Influenza Inj Mdck Quad Pf 06/23/2019  . Influenza,inj,Quad PF,6+ Mos 10/13/2016, 07/12/2020  . Influenza,trivalent, recombinat, inj, PF 08/08/2018  . Influenza-Unspecified 07/06/2015, 08/12/2017  . PFIZER(Purple Top)SARS-COV-2 Vaccination 12/03/2019, 12/29/2019, 08/05/2020  . Pneumococcal Polysaccharide-23 07/14/2019  . Tdap 09/14/2019  . Zoster Recombinat (Shingrix) 07/14/2019, 09/14/2019    Past Medical History:  Diagnosis Date  . Asthma   . Constipation   . Depression   . GERD (gastroesophageal reflux disease)   . Gout    hx of gout  . H/O hiatal hernia   . Hyperlipidemia   . Hypertension   . Prediabetes   . Proteinuria 2005  . Sleep apnea    uses c-pap  . SOB (shortness of breath) on exertion   . Vitamin D deficiency     Tobacco History: Social History   Tobacco Use  Smoking Status Former Smoker  . Types: Cigars  Smokeless Tobacco Never Used  Tobacco Comment   occasional ciagar/ last one 2017   Counseling given: Not Answered Comment: occasional ciagar/ last one 2017   Outpatient Medications Prior to Visit  Medication Sig Dispense Refill  . albuterol (PROAIR HFA) 108 (90 Base) MCG/ACT inhaler Inhale 2 puffs into the lungs every 6 (six) hours as needed for wheezing or shortness of breath. 1 each 5  . allopurinol (ZYLOPRIM) 300 MG tablet Take 1 tablet by mouth once  daily 90 tablet 3  . amLODipine (NORVASC) 10 MG tablet Take 1 tablet by mouth once daily 90 tablet 0  . budesonide-formoterol (SYMBICORT) 80-4.5 MCG/ACT inhaler Inhale 2 puffs into the lungs 2 (two) times daily. 1 each 5  . Cholecalciferol (VITAMIN D3 PO) Take by mouth.    . clomiPHENE (CLOMID) 50 MG tablet Take 50 mg by mouth daily.    [name] escitalopram (LEXAPRO) 20 MG tablet Take 1 tablet (20 mg total) by mouth daily. 90 tablet 1  . famotidine (PEPCID) 20 MG tablet Take 2 tablets (40 mg total) by mouth daily.    [name] losartan  (COZAAR) 100 MG tablet Take 1 tablet (100 mg total) by mouth daily. 90 tablet 2  . Multiple Vitamin (MULTIVITAMIN ADULT PO) Take by mouth.    . nebivolol (BYSTOLIC) 5 MG tablet Take 1 tablet (5 mg total) by mouth daily. 90 tablet 1  . pravastatin (PRAVACHOL) 10 MG tablet Take 1 tablet by mouth once daily 30 tablet 0  . predniSONE (DELTASONE) 20 MG tablet Take 1 tablet (20 mg total) by mouth daily with breakfast. 5 tablet 0  . Semaglutide (RYBELSUS) 14 MG TABS Take 14 mg by mouth daily before breakfast. 90 tablet 1  . Turmeric 500 MG CAPS Take by mouth daily.    . Vitamin D, Ergocalciferol, (DRISDOL) 1.25 MG (50000 UNIT) CAPS capsule Take 1 capsule (50,000 Units total) by mouth every 7 (seven) days. 4 capsule 0   No facility-administered medications prior to visit.     Review of Systems:   Constitutional:   No  weight loss, night sweats,  Fevers, chills, +fatigue, or  lassitude.  HEENT:   No headaches,  Difficulty swallowing,  Tooth/dental problems, or  Sore throat,                No sneezing, itching, ear ache, nasal congestion, post nasal drip,   CV:  No   Orthopnea, PND, swelling in lower extremities, anasarca, dizziness, palpitations, syncope.   GI  No heartburn, indigestion, abdominal pain, nausea, vomiting, diarrhea, change in bowel habits, loss of appetite, bloody stools.   Resp:  .  No chest wall deformity  Skin: no rash or lesions.  GU: no dysuria, change in color of urine, no urgency or frequency.  No flank pain, no hematuria   MS:  No joint pain or swelling.  No decreased range of motion.  No back pain.    Physical Exam  BP 110/68 (BP Location: Left Arm, Patient Position: Sitting, Cuff Size: Normal)   Pulse 85   Temp 97.7 F (36.5 C) (Temporal)   Ht 5\' 10"  (1.778 m)   Wt 254 lb 3.2 oz (115.3 kg)   SpO2 97%   BMI 36.47 kg/m   GEN: A/Ox3; pleasant , NAD, well nourished    HEENT:  [place]/AT,   NOSE-clear, THROAT-clear, no lesions, no postnasal drip or exudate noted.    NECK:  Supple w/ fair ROM; no JVD; normal carotid impulses w/o bruits; no thyromegaly or nodules palpated; no lymphadenopathy.    RESP  Clear  P & A; w/o, wheezes/ rales/ or rhonchi. no accessory muscle use, no dullness to percussion  CARD:  RRR, no m/r/g, no peripheral edema, pulses intact, no cyanosis or clubbing.  GI:   Soft & nt; nml bowel sounds; no organomegaly or masses detected.   Musco: Warm bil, no deformities or joint swelling noted.   Neuro: alert, no focal deficits noted.    Skin: Warm, no lesions  or rashes    Lab Results:    BNP No results found for: BNP  ProBNP No results found for: PROBNP  Imaging: CT Chest High Resolution  Result Date: 02/12/2021 CLINICAL DATA:  Persistent cough for 1 month, asthma, evaluate for ILD EXAM: CT CHEST WITHOUT CONTRAST TECHNIQUE: Multidetector CT imaging of the chest was performed following the standard protocol without intravenous contrast. High resolution imaging of the lungs, as well as inspiratory and expiratory imaging, was performed. COMPARISON:  None. FINDINGS: Cardiovascular: No significant vascular findings. Normal heart size. No pericardial effusion. Mediastinum/Nodes: No enlarged mediastinal, hilar, or axillary lymph nodes. Thyroid gland, trachea, and esophagus demonstrate no significant findings. Lungs/Pleura: No evidence of fibrotic interstitial lung disease. No significant air trapping on expiratory phase imaging. No pleural effusion or pneumothorax. Upper Abdomen: No acute abnormality. Cyst or hemangioma of the posterior liver dome (series 2, image 125). Musculoskeletal: No chest wall mass or suspicious bone lesions identified. IMPRESSION: No evidence of fibrotic interstitial lung disease. No significant air trapping on expiratory phase imaging. Electronically Signed   By: Eddie Candle M.D.   On: 02/12/2021 09:41      PFT Results Latest Ref Rng & Units 12/12/2020 12/02/2020  FVC-Predicted Pre % 65 65  FVC-Post L 3.31  3.31  FVC-Predicted Post % 68 68  Pre FEV1/FVC % % 70 70  Post FEV1/FCV % % 71 71  FEV1-Pre L 2.21 2.21  FEV1-Predicted Pre % 59 59  FEV1-Post L 2.36 2.36  DLCO uncorrected ml/min/mmHg 21.89 21.89  DLCO UNC% % 78 78  DLCO corrected ml/min/mmHg 21.89 21.89  DLCO COR %Predicted % 78 78  DLVA Predicted % 96 96  TLC L 5.59 5.59  TLC % Predicted % 82 82  RV % Predicted % 121 121    No results found for: NITRICOXIDE      Assessment & Plan:   OSA (obstructive sleep apnea) Excellent control and compliance on nocturnal CPAP  Plan  Patient Instructions  Refer to Cardiology .  Continue on Symbicort 2 puffs Twice daily, rinse after use.  Albuterol inhaler As needed   Asthma action plan .  Pepcid 20mg  Twice daily  .  Continue on Zyrtec 10mg  daily  Continue on CPAP At bedtime  .  Do not drive if sleepy .  Follow up with Dr. Elsworth Soho  In in 4-6  months and As needed        Asthma Recent flare and cough.  Cough has improved with Tessalon and short course of prednisone.  Also with GERD controlled.  Continue on Symbicort for now. Recent chest x-ray/CT chest without acute findings.  Plan  Patient Instructions  Refer to Cardiology .  Continue on Symbicort 2 puffs Twice daily, rinse after use.  Albuterol inhaler As needed   Asthma action plan .  Pepcid 20mg  Twice daily  .  Continue on Zyrtec 10mg  daily  Continue on CPAP At bedtime  .  Do not drive if sleepy .  Follow up with Dr. Elsworth Soho  In in 4-6  months and As needed       SOB (shortness of breath) on exertion Ongoing shortness of breath with activity may be a component of asthma and deconditioning.  Patient does have several risk factors including diabetes hyperlipidemia obesity and hypertension.  High-resolution CT chest was negative.  Pulmonary function testing shows some mild to moderate restriction consistent with asthma He does have some intermittent episodes of chest tightness with heavy exercise such as riding his bike.  We will refer him to cardiology to see if additional work-up is indicated as he has multiple risk factors Patient is advised to avoid heavy exercise until cardiology evaluation.  Patient is advised if he develops chest pain to seek emergency room care immediately.  Plan  Patient Instructions  Refer to Cardiology .  Continue on Symbicort 2 puffs Twice daily, rinse after use.  Albuterol inhaler As needed   Asthma action plan .  Pepcid 20mg  Twice daily  .  Continue on Zyrtec 10mg  daily  Continue on CPAP At bedtime  .  Do not drive if sleepy .  Follow up with Dr. Elsworth Soho  In in 4-6  months and As needed            Rexene Edison, NP 02/13/2021

## 2021-02-13 NOTE — Assessment & Plan Note (Signed)
Excellent control and compliance on nocturnal CPAP  Plan  Patient Instructions  Refer to Cardiology .  Continue on Symbicort 2 puffs Twice daily, rinse after use.  Albuterol inhaler As needed   Asthma action plan .  Pepcid 20mg  Twice daily  .  Continue on Zyrtec 10mg  daily  Continue on CPAP At bedtime  .  Do not drive if sleepy .  Follow up with Dr. Elsworth Soho  In in 4-6  months and As needed

## 2021-02-13 NOTE — Patient Instructions (Addendum)
Refer to Cardiology .  Continue on Symbicort 2 puffs Twice daily, rinse after use.  Albuterol inhaler As needed   Asthma action plan .  Pepcid 20mg  Twice daily  .  Continue on Zyrtec 10mg  daily  Continue on CPAP At bedtime  .  Do not drive if sleepy .  Follow up with Dr. Elsworth Soho  In in 4-6  months and As needed

## 2021-02-13 NOTE — Assessment & Plan Note (Signed)
Ongoing shortness of breath with activity may be a component of asthma and deconditioning.  Patient does have several risk factors including diabetes hyperlipidemia obesity and hypertension.  High-resolution CT chest was negative.  Pulmonary function testing shows some mild to moderate restriction consistent with asthma He does have some intermittent episodes of chest tightness with heavy exercise such as riding his bike.  We will refer him to cardiology to see if additional work-up is indicated as he has multiple risk factors Patient is advised to avoid heavy exercise until cardiology evaluation.  Patient is advised if he develops chest pain to seek emergency room care immediately.  Plan  Patient Instructions  Refer to Cardiology .  Continue on Symbicort 2 puffs Twice daily, rinse after use.  Albuterol inhaler As needed   Asthma action plan .  Pepcid 20mg  Twice daily  .  Continue on Zyrtec 10mg  daily  Continue on CPAP At bedtime  .  Do not drive if sleepy .  Follow up with Dr. Elsworth Soho  In in 4-6  months and As needed

## 2021-02-13 NOTE — Addendum Note (Signed)
Addended by: Vanessa Barbara on: 02/13/2021 04:45 PM   Modules accepted: Orders

## 2021-02-18 ENCOUNTER — Ambulatory Visit (INDEPENDENT_AMBULATORY_CARE_PROVIDER_SITE_OTHER): Payer: BC Managed Care – PPO | Admitting: Adult Health

## 2021-02-19 ENCOUNTER — Ambulatory Visit (HOSPITAL_COMMUNITY): Payer: BC Managed Care – PPO

## 2021-02-24 ENCOUNTER — Other Ambulatory Visit: Payer: Self-pay

## 2021-02-24 ENCOUNTER — Ambulatory Visit (HOSPITAL_COMMUNITY)
Admission: RE | Admit: 2021-02-24 | Discharge: 2021-02-24 | Disposition: A | Discharge status: Home / Self Care | Payer: BC Managed Care – PPO | Source: Ambulatory Visit | Attending: Urology | Admitting: Urology

## 2021-02-24 DIAGNOSIS — R311 Benign essential microscopic hematuria: Secondary | ICD-10-CM | POA: Diagnosis present

## 2021-03-04 ENCOUNTER — Other Ambulatory Visit: Payer: Self-pay | Admitting: Internal Medicine

## 2021-03-18 ENCOUNTER — Ambulatory Visit (INDEPENDENT_AMBULATORY_CARE_PROVIDER_SITE_OTHER): Payer: BC Managed Care – PPO | Admitting: Adult Health

## 2021-03-18 ENCOUNTER — Encounter (INDEPENDENT_AMBULATORY_CARE_PROVIDER_SITE_OTHER): Payer: Self-pay | Admitting: Adult Health

## 2021-03-18 ENCOUNTER — Other Ambulatory Visit: Payer: Self-pay

## 2021-03-18 VITALS — BP 117/72 | HR 80 | Temp 98.7°F | Ht 70.0 in | Wt 251.0 lb

## 2021-03-18 DIAGNOSIS — F3289 Other specified depressive episodes: Secondary | ICD-10-CM

## 2021-03-18 DIAGNOSIS — E1169 Type 2 diabetes mellitus with other specified complication: Secondary | ICD-10-CM

## 2021-03-18 DIAGNOSIS — N184 Chronic kidney disease, stage 4 (severe): Secondary | ICD-10-CM | POA: Diagnosis not present

## 2021-03-18 DIAGNOSIS — E559 Vitamin D deficiency, unspecified: Secondary | ICD-10-CM

## 2021-03-18 DIAGNOSIS — Z6836 Body mass index (BMI) 36.0-36.9, adult: Secondary | ICD-10-CM

## 2021-03-18 NOTE — Progress Notes (Signed)
Office: [phone]  /  Fax: [phone]    Date: March 31, 2021   Appointment Start Time: 3:01pm Duration: 55 minutes Provider: Glennie Fox, Psy.D. Type of Session: Intake for Individual Therapy  Location of Patient: Home Location of Provider: Provider's home (private office) Type of Contact: Telepsychological Visit via MyChart Video Visit  Informed Consent: Prior to proceeding with today's appointment, two pieces of identifying information were obtained. In addition, Frederick Fox's physical location at the time of this appointment was obtained as well a phone number he could be reached at in the event of technical difficulties. Frederick Fox and this provider participated in today's telepsychological service. Of note, today's appointment was switched to a regular telephone call at 3:10pm with Frederick Fox's verbal consent due to technical issues.   The provider's role was explained to Frederick Fox. The provider reviewed and discussed issues of confidentiality, privacy, and limits therein (e.g., reporting obligations). In addition to verbal informed consent, written informed consent for psychological services was obtained prior to the initial appointment. Since the clinic is not a 24/7 crisis Fox, mental health emergency resources were shared and this  provider explained MyChart, e-mail, voicemail, and/or other messaging systems should be utilized only for non-emergency reasons. This provider also explained that information obtained during appointments will be placed in [facility] record and relevant information will be shared with other providers at [facility] for coordination of care. Frederick Fox agreed information may be shared with other [facility] providers as needed for coordination of care and by signing the service agreement document, he provided written consent for coordination of care. Prior to initiating telepsychological services, Frederick Fox completed an informed  consent document, which included the development of a safety plan (i.e., an emergency contact and emergency resources) in the event of an emergency/crisis. Frederick Fox expressed understanding of the rationale of the safety plan. Frederick Fox verbally acknowledged understanding he is ultimately responsible for understanding his insurance benefits for telepsychological and in-person services. This provider also reviewed confidentiality, as it relates to telepsychological services, as well as the rationale for telepsychological services (i.e., to reduce exposure risk to COVID-19). Frederick Fox  acknowledged understanding that appointments cannot be recorded without both party consent and he is aware he is responsible for securing confidentiality on his end of the session. Frederick Fox verbally consented to proceed.  Chief Complaint/HPI: Frederick Fox was referred by Frederick Marble, NP-C due to other depression, with emotional eating. Per the note for the visit with Frederick Marble, NP-C on March 18, 2021, "Frederick Fox will emotionally eat when stressed- I.e: daughter's recent high school graduation. He is on Lexapro 20 mg QD and denies suicidal or homicidal ideations." The note for the initial appointment with Dr. Mellody Dance on August 06, 2020 indicated the following: "His family eats meals together, he thinks his family will eat healthier with him, his desired weight loss is 70 pounds, he has been heavy most of his life, he started gaining excessive weight recently, his heaviest weight ever was 270 pounds, he craves sushi, pretzels, and ice cream, he is trying to follow a vegetarian diet, he frequently makes poor food choices, he frequently eats larger portions than normal and he struggles with emotional eating." Frederick Fox's Food and Mood (modified PHQ-9) score on August 06, 2020 was 23.  During today's appointment, Frederick Fox was verbally administered a questionnaire assessing various behaviors related to emotional eating. Frederick Fox endorsed the following:  experience food cravings on a regular basis, eat certain foods when you are anxious, stressed, depressed, or your  feelings are hurt, use food to help you cope with emotional situations, overeat frequently when you are bored or lonely, not worry about what you eat when you are in a good mood, overeat when you are alone, but eat much less when you are with other people, and eat as a reward. He shared he craves a "good bagel," ice cream, watermelon and cake. Quintavis believes the onset of emotional eating behaviors was likely in childhood, and described the current frequency of emotional eating behaviors as "few times a month." In addition, Cj denied a history of binge eating. Frederick Fox denied a history of purging and engagement in other compensatory strategies for weight loss; however, he discussed a history of restricting food intake (e.g., eating 2xs a day, significantly reducing calorie intake) approximately 10-20 years ago. He stated he has never been diagnosed with an eating disorder. He also denied a history of treatment for emotional eating behaviors. Currently, Frederick Fox indicated he is unsure what triggers emotional eating behaviors, noting, "It happens at different times." Furthermore, Frederick Fox reported he does not have the best relationship with his children.  Mental Status Examination:  Appearance: well groomed and appropriate hygiene  Behavior: appropriate to circumstances Mood: euthymic Affect: mood congruent Speech: normal in rate, volume, and tone Eye Contact: appropriate Psychomotor Activity: unable to assess  Gait: unable to assess Thought Process: linear, logical, and goal directed  Thought Content/Perception: denies suicidal and homicidal ideation, plan, and intent, no hallucinations, delusions, bizarre thinking or behavior reported or observed, and denies ideation and engagement in self-injurious behaviors Orientation: time, person, place, and purpose of appointment Memory/Concentration:  memory, attention, language, and fund of knowledge intact  Insight/Judgment: fair  Family & Psychosocial History: Alam reported he is married and has two children (ages 6 and 74) from his previous marriage. 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 Additionally, Frederick Fox shared his highest level of education obtained is a master's degree. Currently, Frederick Fox social support system consists of his wife and co -workers. Moreover, Frederick Fox stated he resides with his wife.   Medical History:  Past Medical History:  Diagnosis Date   Asthma    Constipation    Depression    GERD (gastroesophageal reflux disease)    Gout    hx of gout   H/O hiatal hernia    Hyperlipidemia    Hypertension    Prediabetes    Proteinuria 2005   Sleep apnea    uses c-pap   SOB (shortness of breath) on exertion    Vitamin D deficiency    Past Surgical History:  Procedure Laterality Date   RENAL BIOPSY  2005   protien in urine, no pathology- nephrologist in [facility] EXTRACTION     Current Outpatient Medications on File Prior to Visit  Medication Sig Dispense Refill   albuterol (PROAIR HFA) 108 (90 Base) MCG/ACT inhaler Inhale 2 puffs into the lungs every 6 (six) hours as needed for wheezing or shortness of breath. 1 each 5   allopurinol (ZYLOPRIM) 300 MG tablet Take 1 tablet by mouth once daily 90 tablet 3   amLODipine (NORVASC) 10 MG tablet Take 1 tablet by mouth once daily 90 tablet 0   budesonide-formoterol (SYMBICORT) 80-4.5 MCG/ACT inhaler Inhale 2 puffs into the lungs 2 (two) times daily. 1 each 5   Cholecalciferol (VITAMIN D3 PO) Take by mouth.     clomiPHENE (CLOMID) 50 MG tablet Take 50 mg by mouth daily.  escitalopram (LEXAPRO) 20 MG tablet Take 1 tablet (20 mg total) by mouth daily. 90 tablet 1   famotidine (PEPCID) 20 MG tablet Take 2 tablets (40 mg total) by mouth daily.     losartan (COZAAR) 100 MG tablet Take 1 tablet (100  mg total) by mouth daily. 90 tablet 2   Multiple Vitamin (MULTIVITAMIN ADULT PO) Take by mouth.     nebivolol (BYSTOLIC) 5 MG tablet Take 1 tablet by mouth once daily 90 tablet 0   pravastatin (PRAVACHOL) 10 MG tablet Take 1 tablet by mouth once daily 30 tablet 0   predniSONE (DELTASONE) 20 MG tablet Take 1 tablet (20 mg total) by mouth daily with breakfast. 5 tablet 0   Semaglutide (RYBELSUS) 14 MG TABS Take 14 mg by mouth daily before breakfast. 90 tablet 1   Turmeric 500 MG CAPS Take by mouth daily.     Vitamin D, Ergocalciferol, (DRISDOL) 1.25 MG (50000 UNIT) CAPS capsule Take 1 capsule (50,000 Units total) by mouth every 7 (seven) days. 4 capsule 0   No current facility-administered medications on file prior to visit.   Mental Health History: Govind reported he first attended therapeutic services around 40 years ago due to his parents' divorce, adding, "I'm not actually 100% sure." He shared he has been in and out of therapy since then. Currently, Maxine stated he is meeting with Frederick Leach, MS, [facility], EMDR-C, [facility], CLC/CHC with [facility] to address depression symptoms, relationship with children and eating concerns. He explained they recently discussed his past (e.g., going to get bagels with his father in childhood) and how it impacts his eating habits, adding they have not discussed coping with eating concerns. They meet every two weeks and the next appointment is April 09, 2021. Currently, his PCP prescribes Lexapro, adding, "That's been really good." He shared he was previously prescribed Fluoxetine and Wellbutrin, noting he occasionally  experienced hallucinations (e.g., "flash" of a tragic incident) and informed his prescribing provider. He denied experiencing hallucinations outside of the aforementioned and they ceased after discontinuation of medications. Stefen reported there is no history of hospitalizations for psychiatric concerns. Mivaan stated his mother  believed his father suffered "mental issues." Mohd reported there is no history of trauma including psychological, physical , and sexual abuse, as well as neglect.   Akon reported approximately nine years ago he experienced suicidal ideation and plan. He explained he was going through a divorce and was experiencing "heavy" depression. Koven stated he had a couple plans: leave home and take antifreeze or carbon monoxide poisoning, adding he did not write a note. He denied experiencing suicide intent nor did he initiate an attempt. Ardie denied a history of suicide attempts. He last experienced suicidal ideation was approximately 9 years. Notably, Aleksandr endorsed item 9 (i.e., "Do you feel that your weight problem is so hopeless that sometimes life doesn't seem worth living?") on the modified PHQ-9 during his initial appointment with Dr. Mellody Dance on August 06, 2020. He clarified he endorsed the item due to feeling stuck with his weight loss due to being on "a yo yo path." He denied endorsing the item due to suicidal ideation. The following protective factors were identified for Fread: children and their milestones and wife. If he were to become overwhelmed in the future, which is a sign that a crisis may occur, he identified the following coping skills he could engage in: meditating; listening to music; taking a drive; and listening to old voicemail from his  wife. It was recommended the aforementioned be written down and developed into a coping card for future reference; he stated he wrote them down. Psychoeducation regarding the importance of reaching out to a trusted individual and/or utilizing emergency resources if there is a change in emotional status and/or there is an inability to ensure safety was provided. Lorence's confidence in reaching out to a trusted individual and/or utilizing emergency resources should there be an intensification in emotional status and/or there is an inability to  ensure safety was assessed on a scale of one to ten where one is not confident and ten is extremely confident. He reported his confidence is a 10. Additionally, Lanson denied current access to firearms and/or weapons.   Earland described his typical mood lately as "fabulous" as he is currently on vacation. Aside from concerns noted above and endorsed on the PHQ-9 and GAD-7, Jmari reported experiencing worry thoughts about his health concerns/current medications and the possibility of becoming homeless (denied financial concerns). He recalled experiencing one panic attack approximately 5-6 years ago while working at [employer]. Jeremih endorsed occasional alcohol use, adding he consumes two standard beverages approximately 1-2xs a month. He denied current tobacco use. He denied illicit/recreational substance use. He denied caffeine intake. Furthermore, Jastin indicated he is not experiencing the following: hallucinations and delusions, paranoia, symptoms of mania , social withdrawal, crying spells, and panic attacks. He also denied current suicidal ideation, plan, and intent; history of and current homicidal ideation, plan, and intent; and history of and current engagement in self-harm.   The following strengths were reported by [facility] humor, caring, and patience. The following strengths were observed by this provider: ability to express thoughts and feelings during the therapeutic session, ability to establish and benefit from a therapeutic relationship, willingness to work toward established goal(s) with the clinic and ability to engage in reciprocal conversation.   Legal History: Frederick Fox reported there is no history of legal involvement.   Structured Assessments Results: The Patient Health Questionnaire-9 (PHQ-9) is a self-report measure that assesses symptoms and severity of depression over the course of the last two weeks. Kainoah obtained a score of 6 suggesting mild depression. Chinedu finds the endorsed  symptoms to be not difficult at all. [0= Not at all; 1= Several days; 2= More than half the days; 3= Nearly every day] Little interest or pleasure in doing things 0  Feeling down, depressed, or hopeless 2  Trouble falling or staying asleep, or sleeping too much 0  Feeling tired or having little energy 0  Poor appetite or overeating 0  Feeling bad about yourself --- or that you are a failure or have let yourself or your family down 1  Trouble concentrating on things, such as reading the newspaper or watching television 0  Moving or speaking so slowly that other people could have noticed? Or the opposite --- being so fidgety or restless that you have been moving around a lot more than usual 3  Thoughts that you would be better off dead or hurting yourself in some way 0  PHQ-9 Score 6    The Generalized Anxiety Disorder-7 (GAD-7) is a brief self-report measure that assesses symptoms of anxiety over the course of the last two weeks. Braidyn obtained a score of 2 suggesting minimal anxiety. Jasir finds the endorsed symptoms to be very difficult. [0= Not at all; 1= Several days; 2= Over half the days; 3= Nearly every day] Feeling nervous, anxious, on edge 0  Not being able to stop or  control worrying 1  Worrying too much about different things 1  Trouble relaxing 0  Being so restless that it's hard to sit still 0  Becoming easily annoyed or irritable 0  Feeling afraid as if something awful might happen 0  GAD-7 Score 2   Interventions:  Conducted a chart review Focused on rapport building Verbally administered PHQ-9 and GAD-7 for symptom monitoring Verbally administered Food & Mood questionnaire to assess various behaviors related to emotional eating Provided emphatic reflections and validation Collaborated with patient on a treatment goal  Psychoeducation provided regarding physical versus emotional hunger Conducted a risk assessment Developed a coping card  Provisional DSM-5  Diagnosis(es): F32.89 Other Specified Depressive Disorder, Emotional Eating Behaviors  Plan: Lorn appears able and willing to participate as evidenced by collaboration on a treatment goal, engagement in reciprocal conversation, and asking questions as needed for clarification. The next appointment will be scheduled in approximately three weeks, which will be via MyChart Video Visit. The following treatment goal was established: increase coping skills. Paxten agreed to sign an authorization for coordination of care with his primary therapist. He was receptive to signing the form when he comes to the clinic for his appointment on April 08, 2021 with Frederick Fox. This provider will regularly review the treatment plan and medical chart to keep informed of status changes. Jullian expressed understanding and agreement with the initial treatment plan of care. Devere will be sent a handout via e-mail to utilize between now and the next appointment to increase awareness of hunger patterns and subsequent eating. Jaceon provided verbal consent during today's appointment for this provider to send the handout via e-mail.

## 2021-03-24 NOTE — Progress Notes (Signed)
Chief Complaint:   OBESITY Frederick Fox is here to discuss his progress with his obesity treatment plan along with follow-up of his obesity related diagnoses. Frederick Fox is on the Category 3 Plan and states he is following his eating plan approximately 60% of the time. Frederick Fox states he is walking 30 minutes 3 times per week.  Today's visit was #: 12 Starting weight: 256 lbs Starting date: 08/06/2020 Today's weight: 251 lbs Today's date: 03/18/2021 Total lbs lost to date: 5 Total lbs lost since last in-office visit: 0  Interim History: Frederick Fox recently was seen by Frederick Fox.  His labs were drawn and nephrology reassured him that category 3 is not too much protein intake for his CKD.  Subjective:   1. Type 2 diabetes mellitus with other specified complication, without long-term current use of insulin ([name] A1c 12/31/2020 was 6.4.  Frederick Fox is on Rybelsus 14 mg QD and tolerating it well. Discussed risks/benefits of GLP-1.  Lab Results  Component Value Date   HGBA1C 6.4 12/31/2020   HGBA1C 5.7 (H) 10/14/2020   HGBA1C 6.0 (H) 06/04/2020   Lab Results  Component Value Date   MICROALBUR 251.7 (H) 10/09/2008   LDLCALC 111 (H) 06/04/2020   CREATININE 2.76 (H) 12/31/2020   Lab Results  Component Value Date   INSULIN 42.0 (H) 08/06/2020   2. Other depression with emotional eating Frederick Fox will emotionally eat when stressed- I.e: daughter's recent high school graduation. He is on Lexapro 20 mg QD and denies suicidal or homicidal ideations.  3. CKD (chronic kidney disease) stage 4, GFR 15-29 ml/min Frederick Fox) Nephrology ran recent labs 02/20/2021.  Creatinine 2.61 and GFR 28.  4. Vitamin D deficiency Frederick Fox Vitamin D level was 39 on 02/20/2021- drawn at nephrology. He is currently taking prescription vitamin D 50,000 IU each week. He denies nausea, vomiting or muscle weakness.   Assessment/Plan:   1. Type 2 diabetes mellitus with other specified complication, without  long-term current use of insulin ([name] Good blood sugar control is important to decrease the likelihood of diabetic complications such as nephropathy, neuropathy, limb loss, blindness, coronary artery disease, and death. Intensive lifestyle modification including diet, exercise and weight loss are the first line of treatment for diabetes.  -Continue Rybelsus as directed.  2. Other depression with emotional eating Frederick Fox is struggling with emotional eating and using food for comfort to the extent that it is negatively impacting his health. He has been working on behavior modification techniques to help reduce his emotional eating. He shows no sign of suicidal or homicidal ideations. -Refer to Dr. Mallie Mussel.  3. CKD (chronic kidney disease) stage 4, GFR 15-29 ml/min ([name] Lab results and trends reviewed. We discussed several lifestyle modifications today and he will continue to work on diet, exercise and weight loss efforts. Avoid nephrotoxic medications. Orders and follow up as documented in patient record.  -Continue to avoid all nephrotoxic substances. Continue with nephrology as directed.  Counseling Chronic kidney disease (CKD) happens when the kidneys are damaged over a long period of time. Most of the time, this condition does not go away, but it can usually be controlled. Steps must be taken to slow down the kidney damage or to stop it from getting worse. Intensive lifestyle modifications are the first line treatment for this issue.  Avoid buying foods that are: processed, frozen, or prepackaged to avoid excess salt.  4. Vitamin D deficiency Low Vitamin D level contributes to fatigue and are associated with obesity, breast, and colon cancer.  He agrees to continue to take prescription Vitamin D @50 ,000 IU every week and will follow-up for routine testing of Vitamin D, at least 2-3 times per year to avoid over-replacement.  5. Class 2 severe obesity with serious comorbidity and body mass index  (BMI) of 36.0 to 36.9 in adult, unspecified obesity type ([name]  Frederick Fox is currently in the action stage of change. As such, his goal is to continue with weight loss efforts. He has agreed to the Category 3 Plan.   Exercise goals:  As is  Behavioral modification strategies: increasing lean protein intake, decreasing simple carbohydrates, meal planning and cooking strategies, keeping healthy foods in the home, better snacking choices, and planning for success.  Frederick Fox has agreed to follow-up with our clinic in 3 weeks, fasting. He was informed of the importance of frequent follow-up visits to maximize his success with intensive lifestyle modifications for his multiple health conditions.   Objective:   Blood pressure 117/72, pulse 80, temperature 98.7 F (37.1 C), height 5\' 10"  (1.778 m), weight 251 lb (113.9 kg), SpO2 99 %. Body mass index is 36.01 kg/m.  General: Cooperative, alert, well developed, in no acute distress. HEENT: Conjunctivae and lids unremarkable. Cardiovascular: Regular rhythm.  Lungs: Normal work of breathing. Neurologic: No focal deficits.   Lab Results  Component Value Date   CREATININE 2.76 (H) 12/31/2020   BUN 46 (H) 12/31/2020   NA 139 12/31/2020   K 4.2 12/31/2020   CL 107 12/31/2020   CO2 24 12/31/2020   Lab Results  Component Value Date   ALT 18 12/31/2020   AST 19 12/31/2020   ALKPHOS 51 12/31/2020   BILITOT 0.2 12/31/2020   Lab Results  Component Value Date   HGBA1C 6.4 12/31/2020   HGBA1C 5.7 (H) 10/14/2020   HGBA1C 6.0 (H) 06/04/2020   HGBA1C 6.1 12/06/2019   HGBA1C 6.4 07/14/2019   Lab Results  Component Value Date   INSULIN 42.0 (H) 08/06/2020   Lab Results  Component Value Date   TSH 1.060 08/06/2020   Lab Results  Component Value Date   CHOL 164 12/31/2020   HDL 38.90 (L) 12/31/2020   LDLCALC 111 (H) 06/04/2020   LDLDIRECT 67.0 12/31/2020   TRIG 332.0 (H) 12/31/2020   CHOLHDL 4 12/31/2020   Lab Results  Component Value  Date   WBC 7.7 12/31/2020   HGB 12.7 (L) 12/31/2020   HCT 38.0 (L) 12/31/2020   MCV 81.8 12/31/2020   PLT 167.0 12/31/2020   Lab Results  Component Value Date   IRON 33 09/09/2020   TIBC 276 09/09/2020   FERRITIN 59 09/09/2020     Attestation Statements:   Reviewed by clinician on day of visit: allergies, medications, problem list, medical history, surgical history, family history, social history, and previous encounter notes.  Time spent on visit including pre-visit chart review and post-visit care and charting was 32 minutes.   Coral Ceo, CMA, am acting as transcriptionist for Mina Marble, NP.  I have reviewed the above documentation for accuracy and completeness, and I agree with the above. -  Frederick Barrington d. Hutch Rhett, Frederick Fox

## 2021-03-31 ENCOUNTER — Telehealth (INDEPENDENT_AMBULATORY_CARE_PROVIDER_SITE_OTHER): Payer: BC Managed Care – PPO | Admitting: Psychology

## 2021-03-31 ENCOUNTER — Encounter: Payer: Self-pay | Admitting: Internal Medicine

## 2021-03-31 DIAGNOSIS — E1122 Type 2 diabetes mellitus with diabetic chronic kidney disease: Secondary | ICD-10-CM

## 2021-03-31 DIAGNOSIS — N184 Chronic kidney disease, stage 4 (severe): Secondary | ICD-10-CM

## 2021-03-31 DIAGNOSIS — F3289 Other specified depressive episodes: Secondary | ICD-10-CM | POA: Diagnosis not present

## 2021-04-02 ENCOUNTER — Encounter: Payer: Self-pay | Admitting: Internal Medicine

## 2021-04-08 ENCOUNTER — Encounter (INDEPENDENT_AMBULATORY_CARE_PROVIDER_SITE_OTHER): Payer: Self-pay | Admitting: Adult Health

## 2021-04-08 ENCOUNTER — Ambulatory Visit (INDEPENDENT_AMBULATORY_CARE_PROVIDER_SITE_OTHER): Payer: BC Managed Care – PPO | Admitting: Adult Health

## 2021-04-08 ENCOUNTER — Other Ambulatory Visit: Payer: Self-pay

## 2021-04-08 VITALS — BP 126/78 | HR 67 | Temp 98.8°F | Ht 70.0 in | Wt 253.0 lb

## 2021-04-08 DIAGNOSIS — E559 Vitamin D deficiency, unspecified: Secondary | ICD-10-CM | POA: Diagnosis not present

## 2021-04-08 DIAGNOSIS — E1169 Type 2 diabetes mellitus with other specified complication: Secondary | ICD-10-CM | POA: Diagnosis not present

## 2021-04-08 DIAGNOSIS — Z6838 Body mass index (BMI) 38.0-38.9, adult: Secondary | ICD-10-CM

## 2021-04-08 DIAGNOSIS — Z9189 Other specified personal risk factors, not elsewhere classified: Secondary | ICD-10-CM | POA: Diagnosis not present

## 2021-04-08 DIAGNOSIS — E785 Hyperlipidemia, unspecified: Secondary | ICD-10-CM | POA: Diagnosis not present

## 2021-04-08 MED ORDER — VITAMIN D (ERGOCALCIFEROL) 1.25 MG (50000 UNIT) PO CAPS: 50000.0000 [IU] | ORAL_CAPSULE | ORAL | 0 refills | Status: DC

## 2021-04-08 NOTE — Progress Notes (Unsigned)
Office: [phone]  /  Fax: [phone]    Date: April 22, 2021   Appointment Start Time: *** Duration: *** minutes Provider: Glennie Isle, Psy.D. Type of Session: Individual Therapy  Location of Patient: {gbptloc:23249} Location of Provider: Provider's home (private office) Type of Contact: Telepsychological Visit via MyChart Video Visit  Session Content: This provider called Frederick Fox at 9:08am as he did not present for today's appointment. *** As such, today's appointment was initiated *** minutes late.  Frederick Fox is a 55 y.o. male presenting for a follow-up appointment to address the previously established treatment goal of increasing coping skills. Today's appointment was a telepsychological visit due to COVID-19. Frederick Fox provided verbal consent for today's telepsychological appointment and he is aware he is responsible for securing confidentiality on his end of the session. Prior to proceeding with today's appointment, Frederick Fox's physical location at the time of this appointment was obtained as well a phone number he could be reached at in the event of technical difficulties. Frederick Fox and this provider participated in today's telepsychological service.   This provider conducted a brief check-in and verbally administered the PHQ-9 and GAD-7. *** Frederick Fox was receptive to today's appointment as evidenced by openness to sharing, responsiveness to feedback, and {gbreceptiveness:23401}.  Mental Status Examination:  Appearance: {Appearance:22431} Behavior: {Behavior:22445} Mood: {gbmood:21757} Affect: {Affect:22436} Speech: {Speech:22432} Eye Contact: {Eye Contact:22433} Psychomotor Activity: unable to assess Gait: unable to assess Thought Process: {thought process:22448}  Thought Content/Perception: {disturbances:22451} Orientation: {Orientation:22437} Memory/Concentration: {gbcognition:22449} Insight/Judgment: {Insight:22446}  Structured Assessments Results: The Patient Health  Questionnaire-9 (PHQ-9) is a self-report measure that assesses symptoms and severity of depression over the course of the last two weeks. Frederick Fox obtained a score of *** suggesting {GBPHQ9SEVERITY:21752}. Frederick Fox finds the endorsed symptoms to be {gbphq9difficulty:21754}. [0= Not at all; 1= Several days; 2= More than half the days; 3= Nearly every day] Little interest or pleasure in doing things ***  Feeling down, depressed, or hopeless ***  Trouble falling or staying asleep, or sleeping too much ***  Feeling tired or having little energy ***  Poor appetite or overeating ***  Feeling bad about yourself --- or that you are a failure or have let yourself or your family down ***  Trouble concentrating on things, such as reading the newspaper or watching television ***  Moving or speaking so slowly that other people could have noticed? Or the opposite --- being so fidgety or restless that you have been moving around a lot more than usual ***  Thoughts that you would be better off dead or hurting yourself in some way ***  PHQ-9 Score ***    The Generalized Anxiety Disorder-7 (GAD-7) is a brief self-report measure that assesses symptoms of anxiety over the course of the last two weeks. Frederick Fox obtained a score of *** suggesting {gbgad7severity:21753}. Frederick Fox finds the endorsed symptoms to be {gbphq9difficulty:21754}. [0= Not at all; 1= Several days; 2= Over half the days; 3= Nearly every day] Feeling nervous, anxious, on edge ***  Not being able to stop or control worrying ***  Worrying too much about different things ***  Trouble relaxing ***  Being so restless that it's hard to sit still ***  Becoming easily annoyed or irritable ***  Feeling afraid as if something awful might happen ***  GAD-7 Score ***   Interventions:  {Interventions:22172}  DSM-5 Diagnosis(es): F32.89 Other Specified Depressive Disorder, Emotional Eating Behaviors  Treatment Goal & Progress: During the initial appointment  with this provider, the following treatment goal was established: increase coping skills. [name]  has demonstrated progress in his goal as evidenced by {gbtxprogress:22839}. [name] also {gbtxprogress2:22951}.  Plan: The next appointment will be scheduled in {gbweeks:21758}, which will be {gbtxmodality:23402}. The next session will focus on {Plan for Next Appointment:23400}.

## 2021-04-10 NOTE — Progress Notes (Signed)
Chief Complaint:   OBESITY Frederick Fox is here to discuss his progress with his obesity treatment plan along with follow-up of his obesity related diagnoses. Frederick Fox is on the Category 3 Plan and states he is following his eating plan approximately 100% of the time. Frederick Fox states he is walking 45-60 minutes 7 times per week.  Today's visit was #: 13 Starting weight: 256 lbs Starting date: 08/06/2020 Today's weight: 253 lbs Today's date: 04/08/2021 Total lbs lost to date: 3 Total lbs lost since last in-office visit: 0  Interim History: Frederick Fox came into contact with poison ivy last Wednesday, started prednisone dose on Thursday, and will complete it this evening. He is up 2 lbs since his last OV.  Reviewed Body Composition with pt.  Subjective:   1. Vitamin D deficiency He is currently taking OTC vitamin D ? each day. He denies nausea, vomiting or muscle weakness.  His Vit D level was 46.2 on 12/31/2020- just below goal of 50.  Vit D level checked with nephrology May 2022- per nephrology, ok to stop ergocalciferol and use OTC "if he wants".  Lab Results  Component Value Date   VD25OH 46.20 12/31/2020   VD25OH 41.0 09/09/2020   VD25OH 26 (L) 06/04/2020   2. Type 2 diabetes mellitus with other specified complication, without long-term current use of insulin ([name] Frederick Fox is on Rybelsus 14 mg QD, managed by PCP. Ambulatory fasting BG runs 100-130.  Lab Results  Component Value Date   HGBA1C 6.4 12/31/2020   HGBA1C 5.7 (H) 10/14/2020   HGBA1C 6.0 (H) 06/04/2020   Lab Results  Component Value Date   MICROALBUR 251.7 (H) 10/09/2008   LDLCALC 111 (H) 06/04/2020   CREATININE 2.76 (H) 12/31/2020   Lab Results  Component Value Date   INSULIN 42.0 (H) 08/06/2020   3. Hyperlipidemia associated with type 2 diabetes mellitus ([place]) PCP manages Pravastatin 20 mg QD. Frederick Fox denies myalgias.  Lab Results  Component Value Date   ALT 18 12/31/2020   AST 19 12/31/2020   ALKPHOS 51  12/31/2020   BILITOT 0.2 12/31/2020   Lab Results  Component Value Date   CHOL 164 12/31/2020   HDL 38.90 (L) 12/31/2020   LDLCALC 111 (H) 06/04/2020   LDLDIRECT 67.0 12/31/2020   TRIG 332.0 (H) 12/31/2020   CHOLHDL 4 12/31/2020   4. At risk for osteoporosis Frederick Fox is at higher risk of osteopenia and osteoporosis due to Vitamin D deficiency and obesity.  Assessment/Plan:   1. Vitamin D deficiency Low Vitamin D level contributes to fatigue and are associated with obesity, breast, and colon cancer. He agrees to continue to take prescription Vitamin D @50 ,000 IU every week and will follow-up for routine testing of Vitamin D, at least 2-3 times per year to avoid over-replacement.  Restart- Vitamin D, Ergocalciferol, (DRISDOL) 1.25 MG (50000 UNIT) CAPS capsule; Take 1 capsule (50,000 Units total) by mouth every 7 (seven) days.  Dispense: 4 capsule; Refill: 0  2. Type 2 diabetes mellitus with other specified complication, without long-term current use of insulin ([name] Good blood sugar control is important to decrease the likelihood of diabetic complications such as nephropathy, neuropathy, limb loss, blindness, coronary artery disease, and death. Intensive lifestyle modification including diet, exercise and weight loss are the first line of treatment for diabetes.  Continue oral GLP.  3. Hyperlipidemia associated with type 2 diabetes mellitus ([place]) Cardiovascular risk and specific lipid/LDL goals reviewed.  We discussed several lifestyle modifications today and Frederick Fox will continue to  work on diet, exercise and weight loss efforts. Orders and follow up as documented in patient record.  Check labs at next OV. Decrease CHO/sugar.  Counseling Intensive lifestyle modifications are the first line treatment for this issue. Dietary changes: Increase soluble fiber. Decrease simple carbohydrates. Exercise changes: Moderate to vigorous-intensity aerobic activity 150 minutes per week if  tolerated. Lipid-lowering medications: see documented in medical record.  4. At risk for osteoporosis Frederick Fox was given approximately 15 minutes of osteoporosis prevention counseling today. Frederick Fox is at risk for osteopenia and osteoporosis due to his Vitamin D deficiency. He was encouraged to take his Vitamin D and follow his higher calcium diet and increase strengthening exercise to help strengthen his bones and decrease his risk of osteopenia and osteoporosis.  Repetitive spaced learning was employed today to elicit superior memory formation and behavioral change.  5. Current BMI 36.3  Frederick Fox is currently in the action stage of change. As such, his goal is to continue with weight loss efforts. He has agreed to the Category 3 Plan.   -Check fasting labs at next OV.  Exercise goals:  As is  Behavioral modification strategies: increasing lean protein intake, decreasing simple carbohydrates, meal planning and cooking strategies, keeping healthy foods in the home, and planning for success.  Frederick Fox has agreed to follow-up with our clinic in 4 weeks. He was informed of the importance of frequent follow-up visits to maximize his success with intensive lifestyle modifications for his multiple health conditions.   Objective:   Blood pressure 126/78, pulse 67, temperature 98.8 F (37.1 C), height 5\' 10"  (1.778 m), weight 253 lb (114.8 kg), SpO2 95 %. Body mass index is 36.3 kg/m.  General: Cooperative, alert, well developed, in no acute distress. HEENT: Conjunctivae and lids unremarkable. Cardiovascular: Regular rhythm.  Lungs: Normal work of breathing. Neurologic: No focal deficits.   Lab Results  Component Value Date   CREATININE 2.76 (H) 12/31/2020   BUN 46 (H) 12/31/2020   NA 139 12/31/2020   K 4.2 12/31/2020   CL 107 12/31/2020   CO2 24 12/31/2020   Lab Results  Component Value Date   ALT 18 12/31/2020   AST 19 12/31/2020   ALKPHOS 51 12/31/2020   BILITOT 0.2 12/31/2020    Lab Results  Component Value Date   HGBA1C 6.4 12/31/2020   HGBA1C 5.7 (H) 10/14/2020   HGBA1C 6.0 (H) 06/04/2020   HGBA1C 6.1 12/06/2019   HGBA1C 6.4 07/14/2019   Lab Results  Component Value Date   INSULIN 42.0 (H) 08/06/2020   Lab Results  Component Value Date   TSH 1.060 08/06/2020   Lab Results  Component Value Date   CHOL 164 12/31/2020   HDL 38.90 (L) 12/31/2020   LDLCALC 111 (H) 06/04/2020   LDLDIRECT 67.0 12/31/2020   TRIG 332.0 (H) 12/31/2020   CHOLHDL 4 12/31/2020   Lab Results  Component Value Date   VD25OH 46.20 12/31/2020   VD25OH 41.0 09/09/2020   VD25OH 26 (L) 06/04/2020   Lab Results  Component Value Date   WBC 7.7 12/31/2020   HGB 12.7 (L) 12/31/2020   HCT 38.0 (L) 12/31/2020   MCV 81.8 12/31/2020   PLT 167.0 12/31/2020   Lab Results  Component Value Date   IRON 33 09/09/2020   TIBC 276 09/09/2020   FERRITIN 59 09/09/2020    Attestation Statements:   Reviewed by clinician on day of visit: allergies, medications, problem list, medical history, surgical history, family history, social history, and previous encounter notes.  Time spent on visit including pre-visit chart review and post-visit care and charting was 30 minutes.   Coral Ceo, CMA, am acting as transcriptionist for Mina Marble, NP.  I have reviewed the above documentation for accuracy and completeness, and I agree with the above. -  Donyetta Ogletree d. Alka Falwell, NP-C

## 2021-04-16 ENCOUNTER — Other Ambulatory Visit: Payer: Self-pay | Admitting: Internal Medicine

## 2021-04-18 ENCOUNTER — Other Ambulatory Visit: Payer: Self-pay

## 2021-04-18 ENCOUNTER — Ambulatory Visit: Payer: BC Managed Care – PPO | Admitting: Internal Medicine

## 2021-04-18 ENCOUNTER — Encounter: Payer: Self-pay | Admitting: Internal Medicine

## 2021-04-18 VITALS — BP 108/70 | HR 66 | Ht 70.0 in | Wt 254.0 lb

## 2021-04-18 DIAGNOSIS — R0789 Other chest pain: Secondary | ICD-10-CM | POA: Diagnosis not present

## 2021-04-18 DIAGNOSIS — R0600 Dyspnea, unspecified: Secondary | ICD-10-CM | POA: Diagnosis not present

## 2021-04-18 DIAGNOSIS — G4733 Obstructive sleep apnea (adult) (pediatric): Secondary | ICD-10-CM

## 2021-04-18 DIAGNOSIS — J4540 Moderate persistent asthma, uncomplicated: Secondary | ICD-10-CM

## 2021-04-18 DIAGNOSIS — I2510 Atherosclerotic heart disease of native coronary artery without angina pectoris: Secondary | ICD-10-CM

## 2021-04-18 DIAGNOSIS — E1159 Type 2 diabetes mellitus with other circulatory complications: Secondary | ICD-10-CM | POA: Diagnosis not present

## 2021-04-18 DIAGNOSIS — I152 Hypertension secondary to endocrine disorders: Secondary | ICD-10-CM

## 2021-04-18 DIAGNOSIS — Z9989 Dependence on other enabling machines and devices: Secondary | ICD-10-CM

## 2021-04-18 DIAGNOSIS — Z8241 Family history of sudden cardiac death: Secondary | ICD-10-CM

## 2021-04-18 DIAGNOSIS — R0609 Other forms of dyspnea: Secondary | ICD-10-CM

## 2021-04-18 DIAGNOSIS — I2584 Coronary atherosclerosis due to calcified coronary lesion: Secondary | ICD-10-CM

## 2021-04-18 NOTE — Progress Notes (Addendum)
Cardiology Office Note:    Date:  04/18/2021   ID:  Frederick Fox, DOB 12/31/1965, MRN [id number]  PCP:  Binnie Rail, MD   [facility] Providers Cardiologist:  Werner Lean, MD     CC: Persistent couch Consulted for the evaluation of shortness of breath at the behest of Binnie Rail, MD  History of Present Illness:    Frederick Fox is a 55 y.o. male with a hx of HTN with DM, OSA on CPAP, Asthma with recent PFTs, Morbid Obesity, HLD with DM who presents for evaluation 04/18/21.  Patient notes that he is feeling a persistent cough that had not improved for months.  (Has improved since waiting for the referral).  Has had no chest pain, chest pressure, chest tightness, chest stinging.  Discomfort occurs with taking a deep breath, and improves with inhaler.  Patient exertion notable for walking and feels no symptoms.  No shortness of breath, DOE .  No PND or orthopnea.  No bendopnea, notices weight gain since COVID-19, leg swelling , or abdominal swelling.  No syncope or near syncope . Notes  no palpitations or funny heart beats.       Past Medical History:  Diagnosis Date   Asthma    Constipation    Depression    GERD (gastroesophageal reflux disease)    Gout    hx of gout   H/O hiatal hernia    Hyperlipidemia    Hypertension    Prediabetes    Proteinuria 2005   Sleep apnea    uses c-pap   SOB (shortness of breath) on exertion    Vitamin D deficiency     Past Surgical History:  Procedure Laterality Date   RENAL BIOPSY  2005   protien in urine, no pathology- nephrologist in [place]   WISDOM TOOTH EXTRACTION      Current Medications: Current Meds  Medication Sig   albuterol (PROAIR HFA) 108 (90 Base) MCG/ACT inhaler Inhale 2 puffs into the lungs every 6 (six) hours as needed for wheezing or shortness of breath.   allopurinol (ZYLOPRIM) 300 MG tablet Take 1 tablet by mouth once daily   amLODipine (NORVASC) 10 MG tablet Take 1 tablet by mouth once  daily   budesonide-formoterol (SYMBICORT) 80-4.5 MCG/ACT inhaler Inhale 2 puffs into the lungs 2 (two) times daily.   Cholecalciferol (VITAMIN D3 PO) Take 5,000 Units by mouth daily.   clomiPHENE (CLOMID) 50 MG tablet Take 50 mg by mouth daily.   escitalopram (LEXAPRO) 20 MG tablet Take 1 tablet by mouth once daily   famotidine (PEPCID) 20 MG tablet Take 2 tablets (40 mg total) by mouth daily.   losartan (COZAAR) 100 MG tablet Take 1 tablet (100 mg total) by mouth daily.   Multiple Vitamin (MULTIVITAMIN ADULT PO) Take by mouth.   nebivolol (BYSTOLIC) 5 MG tablet Take 1 tablet by mouth once daily   pravastatin (PRAVACHOL) 10 MG tablet Take 1 tablet by mouth once daily   Semaglutide (RYBELSUS) 14 MG TABS Take 14 mg by mouth daily before breakfast.   Turmeric 500 MG CAPS Take by mouth daily.   Vitamin D, Ergocalciferol, (DRISDOL) 1.25 MG (50000 UNIT) CAPS capsule Take 1 capsule (50,000 Units total) by mouth every 7 (seven) days.   [DISCONTINUED] predniSONE (DELTASONE) 20 MG tablet Take 1 tablet (20 mg total) by mouth daily with breakfast.     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  Lives with wife and children in a 2 story home.  Has 2 stepchildren and 2 children of his own.     Works as a [employer].     Education: college.       Social Determinants of Health   Financial Resource Strain: Not on file  Food Insecurity: Not on file  Transportation Needs: Not on file  Physical Activity: Not on file  Stress: Not on file  Social Connections: Not on file    Social: From [place], is near vegetarian, he is a [employer] at [place] History: The patient's family history includes Anxiety disorder in his father; Brain cancer in his maternal grandfather; Depression in his father; Healthy in his brother; Heart attack in his paternal grandfather; Hyperlipidemia in his mother; Hypertension in his father and mother; Obesity in his father and mother; Parkinson's disease in his father; Parkinsonism in his father; Sleep apnea in his mother. Grandfather had SCD on an airplane- MI and Dissection not excluded  ROS:   Please see the history of present illness.     All other systems reviewed and are negative.  EKGs/Labs/Other Studies Reviewed:    The following studies were reviewed today:  EKG:  EKG is  ordered today.  The ekg ordered today demonstrates  SR rate 66 LAE  NonCardiac CT : Date: 02/11/2021 Results: LCX calcium Mild Aortic Sclerosis  Recent Labs: 08/06/2020: TSH 1.060 12/31/2020: ALT 18; BUN 46; Creatinine, Ser 2.76; Hemoglobin 12.7; Platelets 167.0; Potassium 4.2; Sodium 139  Recent Lipid Panel    Component Value Date/Time   CHOL 164 12/31/2020 1631   TRIG 332.0 (H) 12/31/2020 1631   HDL 38.90 (L) 12/31/2020 1631   CHOLHDL 4 12/31/2020 1631   VLDL 66.4 (H) 12/31/2020 1631   LDLCALC 111 (H) 06/04/2020 1649   LDLDIRECT 67.0 12/31/2020 [facility]  or Outside Clinic Studies (OSH):  Date: 03/02/21 Cholesterol 164 HDL 39 LDL 59 Tgs 332   Risk Assessment/Calculations:     The 10-year ASCVD risk score [name] DC  Brooke Bonito., et al., 2013) is: 7.6%   Values used to calculate the score:     Age: 3 years     Sex: Male     Is Non-Hispanic African American: No     Diabetic: Yes     Tobacco smoker: No     Systolic Blood Pressure: 536 mmHg     Is BP treated: Yes     HDL Cholesterol: 38.9 mg/dL     Total Cholesterol:  164 mg/dL       Physical Exam:    VS:  BP 108/70   Pulse 66   Ht 5\' 10"  (1.778 m)   Wt 115.2 kg   SpO2 95%   BMI 36.45 kg/m     Wt Readings from Last 3 Encounters:  04/18/21 115.2 kg  04/08/21 114.8 kg  03/18/21 113.9 kg    GEN: Obese well developed in no acute distress HEENT: Normal NECK: No JVD. LYMPHATICS: No lymphadenopathy CARDIAC: RRR, no murmurs, rubs, gallops RESPIRATORY:  Inspiratory wheezes with good air movement ABDOMEN: Soft, non-tender, non-distended MUSCULOSKELETAL:  No edema; No deformity  SKIN: Warm and dry NEUROLOGIC:  Alert and oriented x 3 PSYCHIATRIC:  Normal affect   ASSESSMENT:    1. Atypical chest pain   2. Hypertension associated with type 2 diabetes mellitus ([place])   3. OSA on CPAP   4. Moderate persistent asthma without complication   5. Family history of sudden cardiac death   28. Coronary artery calcification   7. DOE (dyspnea on exertion)    PLAN:    Atypical CP with some DOE HTN with DM OSA on CPAP Asthma with recent PFTs Morbid Obesity Family history of SCD (Grandfather) HLD with DM with CAC and aortic valve calcification - likely CP is non-cardiac in nature; we will get an echocardiogram given this and the aortic valve calcification  - LDL goal < 70  Six months follow up unless new symptoms or abnormal test results warranting change in plan  Would be reasonable for  Video Visit Follow up Would be reasonable for  APP Follow up        Medication Adjustments/Labs and Tests Ordered: Current medicines are reviewed at length with the patient today.  Concerns regarding medicines are outlined above.  Orders Placed This Encounter   Procedures   EKG 12-Lead   ECHOCARDIOGRAM COMPLETE    No orders of the defined types were placed in this encounter.   Patient Instructions  Medication Instructions:  Your physician recommends that you continue on your current medications as directed. Please refer to the Current Medication list given to you today.  *If you need a refill on your cardiac medications before your next appointment, please call your pharmacy*   Lab Work: NONE If you have labs (blood work) drawn today and your tests are completely normal, you will receive your results only by: [place] (if you have MyChart) OR A paper copy in the mail If you have any lab test that is abnormal or we need to change your treatment, we will call you to review the results.   Testing/Procedures: Your physician has requested that you have an echocardiogram. Echocardiography is a painless test that uses sound waves to create images of your heart. It provides your doctor with information about the size and shape of your heart and how well your heart's chambers and valves are working. This procedure takes approximately one hour. There are no restrictions for this procedure.    Follow-Up: At [facility], you and your health needs are our priority.  As part of our continuing mission to provide you with exceptional heart care, we have created designated Provider Care Teams.  These Care Teams include your primary Cardiologist (physician) and Advanced Practice Providers (APPs -  Physician Assistants and Nurse Practitioners) who all work together to provide you with the care you need, when you need it.  Your next appointment:   6 month(s)  The format for your next appointment:  In Person  Provider:   You may see Werner Lean, MD or one of the following Advanced Practice Providers on your designated Care Team:   Melina Copa, PA-C Ermalinda Barrios, PA-C     Signed, Werner Lean, MD  04/18/2021 11:16 AM     [place]

## 2021-04-18 NOTE — Patient Instructions (Signed)
Medication Instructions:  Your physician recommends that you continue on your current medications as directed. Please refer to the Current Medication list given to you today.  *If you need a refill on your cardiac medications before your next appointment, please call your pharmacy*   Lab Work: NONE If you have labs (blood work) drawn today and your tests are completely normal, you will receive your results only by: MyChart Message (if you have MyChart) OR A paper copy in the mail If you have any lab test that is abnormal or we need to change your treatment, we will call you to review the results.   Testing/Procedures: Your physician has requested that you have an echocardiogram. Echocardiography is a painless test that uses sound waves to create images of your heart. It provides your doctor with information about the size and shape of your heart and how well your heart's chambers and valves are working. This procedure takes approximately one hour. There are no restrictions for this procedure.    Follow-Up: At [facility], you and your health needs are our priority.  As part of our continuing mission to provide you with exceptional heart care, we have created designated Provider Care Teams.  These Care Teams include your primary Cardiologist (physician) and Advanced Practice Providers (APPs -  Physician Assistants and Nurse Practitioners) who all work together to provide you with the care you need, when you need it.   Your next appointment:   6 month(s)  The format for your next appointment:   In Person  Provider:   You may see Mahesh A Chandrasekhar, MD or one of the following Advanced Practice Providers on your designated Care Team:   Dayna Dunn, PA-C Michele Lenze, PA-C      

## 2021-04-22 ENCOUNTER — Telehealth (INDEPENDENT_AMBULATORY_CARE_PROVIDER_SITE_OTHER): Payer: BC Managed Care – PPO | Admitting: Psychology

## 2021-04-22 ENCOUNTER — Telehealth (INDEPENDENT_AMBULATORY_CARE_PROVIDER_SITE_OTHER): Payer: Self-pay | Admitting: Psychology

## 2021-04-22 NOTE — Telephone Encounter (Signed)
  Office: [phone]  /  Fax: [phone]  Date of Call: April 22, 2021  Time of Call: 9:08am Duration of Call: ~ 2 minutes Provider: Glennie Isle, PsyD  CONTENT:  This provider called Frederick Fox to check-in as he did not present for today's follow-up appointment via [place] Visit at 9:00am. Frederick Fox shared he forgot about today's appointment, noting he was in [place]. He was receptive to rescheduling and acknowledged understanding that the one time no show fee waiver would be applied for today's appointment. A brief check-in was conducted. Frederick Fox expressed desire to pursue bariatric surgery and was receptive to discussing further at the next appointment. A brief risk assessment was completed. Frederick Fox denied experiencing suicidal, homicidal, and self-injurious ideation, plan, or intent since the last appointment with this provider.   PLAN:  Frederick Fox is scheduled for an appointment on April 29, 2021 at 3:30pm via [place] Visit.

## 2021-04-29 ENCOUNTER — Telehealth (INDEPENDENT_AMBULATORY_CARE_PROVIDER_SITE_OTHER): Payer: BC Managed Care – PPO | Admitting: Psychology

## 2021-04-29 DIAGNOSIS — F3289 Other specified depressive episodes: Secondary | ICD-10-CM

## 2021-04-29 NOTE — Progress Notes (Signed)
Office: [phone]  /  Fax: [phone]    Date: April 29, 2021    Appointment Start Time: 3:30pm Duration: 37 minutes Provider: Glennie Fox, Psy.D. Type of Session: Individual Therapy  Location of Patient: Home Location of Provider: Provider's home (private office) Type of Contact: Telepsychological Visit via MyChart Video Visit  Session Content: Frederick Fox is a 55 y.o. male presenting for a follow-up appointment to address the previously established treatment goal of increasing coping skills. Today's appointment was a telepsychological visit due to COVID-19. Frederick Fox provided verbal consent for today's telepsychological appointment and he is aware he is responsible for securing confidentiality on his end of the session. Prior to proceeding with today's appointment, Frederick Fox's physical location at the time of this appointment was obtained as well a phone number he could be reached at in the event of technical difficulties. Frederick Fox and this provider participated in today's telepsychological service. Of note, today's appointment was switched to a regular telephone call at 3:34pm with Frederick Fox's verbal consent due to technical issues.   This provider conducted a brief check-in. Frederick Fox shared about his trip to [place], adding he made better choices and engaged in portion control. Positive reinforcement was provided. He also discussed walking daily. Frederick Fox discussed a desire to pursue bariatric surgery. Associated thoughts and feelings were briefly processed. He feels surgery is the "easy way out." Brief psychoeducation was provided regarding lifestyle changes required with bariatric surgery. This provider also shared about Frederick Fox's bariatric surgery support groups. Frederick Fox provided verbal consent during today's appointment for this provider to send links for the support groups via e-mail. He added he signed up for a bariatric surgery seminar. Frederick Fox was also encouraged to continue meeting with his  primary therapist and discussed the benefits of supportive psychotherapeutic services while going through the surgery process.   Frederick Fox reported, "I have a drive to eat." Reviewed physical and emotional hunger. Psychoeducation regarding triggers for emotional eating was provided. Frederick Fox was provided a handout, and encouraged to utilize the handout between now and the next appointment to increase awareness of triggers and frequency. Frederick Fox agreed. This provider also discussed behavioral strategies for specific triggers, such as placing the utensil down when conversing to avoid mindless eating. Frederick Fox provided verbal consent during today's appointment for this provider to send a handout about triggers via e-mail. Frederick Fox was receptive to today's appointment as evidenced by openness to sharing, responsiveness to feedback, and willingness to explore triggers for emotional eating.  Mental Status Examination:  Appearance: well groomed and appropriate hygiene  Behavior: appropriate to circumstances Mood: euthymic Affect: mood congruent Speech: normal in rate, volume, and tone Eye Contact: appropriate Psychomotor Activity: unable to assess Gait: unable to assess Thought Process: linear, logical, and goal directed  Thought Content/Perception: no hallucinations, delusions, bizarre thinking or behavior reported or observed and denies suicidal, homicidal, and self-injurious ideation, plan, and intent Orientation: time, person, place, and purpose of appointment Memory/Concentration: memory, attention, language, and fund of knowledge intact  Insight/Judgment: fair  Interventions:  Conducted a brief chart review Provided empathic reflections and validation Processed thoughts and feelings Psychoeducation provided regarding triggers for emotional eating Provided positive reinforcement Employed supportive psychotherapy interventions to facilitate reduced distress, and to improve coping skills with identified  stressors  DSM-5 Diagnosis(es): F32.89 Other Specified Depressive Disorder, Emotional Eating Behaviors  Treatment Goal & Progress: During the initial appointment with this provider, the following treatment goal was established: increase coping skills. Progress is limited, as Frederick Fox has just begun treatment with this provider; however,  he is receptive to the interaction and interventions and rapport is being established.   Plan: The next appointment will be scheduled in 2-3 weeks, which will be via MyChart Video Visit. The next session will focus on working towards the established treatment goal. Additionally, Frederick Fox is scheduled for an appointment with his primary therapist on May 07, 2021.

## 2021-04-30 ENCOUNTER — Ambulatory Visit (INDEPENDENT_AMBULATORY_CARE_PROVIDER_SITE_OTHER): Payer: BC Managed Care – PPO | Admitting: Family Medicine

## 2021-04-30 ENCOUNTER — Encounter (INDEPENDENT_AMBULATORY_CARE_PROVIDER_SITE_OTHER): Payer: Self-pay | Admitting: Family Medicine

## 2021-04-30 ENCOUNTER — Other Ambulatory Visit: Payer: Self-pay

## 2021-04-30 VITALS — BP 120/67 | HR 75 | Temp 98.7°F | Ht 70.0 in | Wt 253.0 lb

## 2021-04-30 DIAGNOSIS — R710 Precipitous drop in hematocrit: Secondary | ICD-10-CM | POA: Diagnosis not present

## 2021-04-30 DIAGNOSIS — E559 Vitamin D deficiency, unspecified: Secondary | ICD-10-CM | POA: Diagnosis not present

## 2021-04-30 DIAGNOSIS — Z6837 Body mass index (BMI) 37.0-37.9, adult: Secondary | ICD-10-CM

## 2021-04-30 DIAGNOSIS — Z794 Long term (current) use of insulin: Secondary | ICD-10-CM

## 2021-04-30 DIAGNOSIS — Z9189 Other specified personal risk factors, not elsewhere classified: Secondary | ICD-10-CM | POA: Diagnosis not present

## 2021-04-30 DIAGNOSIS — E1169 Type 2 diabetes mellitus with other specified complication: Secondary | ICD-10-CM

## 2021-04-30 DIAGNOSIS — E785 Hyperlipidemia, unspecified: Secondary | ICD-10-CM

## 2021-04-30 DIAGNOSIS — E1122 Type 2 diabetes mellitus with diabetic chronic kidney disease: Secondary | ICD-10-CM | POA: Diagnosis not present

## 2021-04-30 DIAGNOSIS — N184 Chronic kidney disease, stage 4 (severe): Secondary | ICD-10-CM

## 2021-05-02 LAB — COMPREHENSIVE METABOLIC PANEL
ALT: 19 IU/L (ref 0–44)
AST: 21 IU/L (ref 0–40)
Albumin/Globulin Ratio: 1.8 (ref 1.2–2.2)
Albumin: 3.9 g/dL (ref 3.8–4.9)
Alkaline Phosphatase: 67 IU/L (ref 44–121)
BUN/Creatinine Ratio: 13 (ref 9–20)
BUN: 34 mg/dL — ABNORMAL HIGH (ref 6–24)
Bilirubin Total: 0.2 mg/dL (ref 0.0–1.2)
CO2: 24 mmol/L (ref 20–29)
Calcium: 8.9 mg/dL (ref 8.7–10.2)
Chloride: 108 mmol/L — ABNORMAL HIGH (ref 96–106)
Creatinine, Ser: 2.61 mg/dL — ABNORMAL HIGH (ref 0.76–1.27)
Globulin, Total: 2.2 g/dL (ref 1.5–4.5)
Glucose: 104 mg/dL — ABNORMAL HIGH (ref 65–99)
Potassium: 4.7 mmol/L (ref 3.5–5.2)
Sodium: 144 mmol/L (ref 134–144)
Total Protein: 6.1 g/dL (ref 6.0–8.5)
eGFR: 28 mL/min/{1.73_m2} — ABNORMAL LOW (ref >59)

## 2021-05-02 LAB — CBC WITH DIFFERENTIAL/PLATELET
Basophils Absolute: 0.1 10*3/uL (ref 0.0–0.2)
Basos: 1 %
EOS (ABSOLUTE): 0.3 10*3/uL (ref 0.0–0.4)
Eos: 4 %
Hematocrit: 41.8 % (ref 37.5–51.0)
Hemoglobin: 13.7 g/dL (ref 13.0–17.7)
Immature Grans (Abs): 0 10*3/uL (ref 0.0–0.1)
Immature Granulocytes: 0 %
Lymphocytes Absolute: 2.2 10*3/uL (ref 0.7–3.1)
Lymphs: 25 %
MCH: 27.5 pg (ref 26.6–33.0)
MCHC: 32.8 g/dL (ref 31.5–35.7)
MCV: 84 fL (ref 79–97)
Monocytes Absolute: 0.6 10*3/uL (ref 0.1–0.9)
Monocytes: 7 %
Neutrophils Absolute: 5.7 10*3/uL (ref 1.4–7.0)
Neutrophils: 63 %
Platelets: 210 10*3/uL (ref 150–450)
RBC: 4.99 x10E6/uL (ref 4.14–5.80)
RDW: 13.6 % (ref 11.6–15.4)
WBC: 8.8 10*3/uL (ref 3.4–10.8)

## 2021-05-02 LAB — LIPID PANEL WITH LDL/HDL RATIO
Cholesterol, Total: 176 mg/dL (ref 100–199)
HDL: 48 mg/dL (ref >39)
LDL Chol Calc (NIH): 79 mg/dL (ref 0–99)
LDL/HDL Ratio: 1.6 ratio (ref 0.0–3.6)
Triglycerides: 302 mg/dL — ABNORMAL HIGH (ref 0–149)
VLDL Cholesterol Cal: 49 mg/dL — ABNORMAL HIGH (ref 5–40)

## 2021-05-02 LAB — VITAMIN D 25 HYDROXY (VIT D DEFICIENCY, FRACTURES): Vit D, 25-Hydroxy: 43.2 ng/mL (ref 30.0–100.0)

## 2021-05-02 LAB — IRON AND TIBC
Iron Saturation: 24 % (ref 15–55)
Iron: 71 ug/dL (ref 38–169)
Total Iron Binding Capacity: 293 ug/dL (ref 250–450)
UIBC: 222 ug/dL (ref 111–343)

## 2021-05-02 LAB — HEMOGLOBIN A1C
Est. average glucose Bld gHb Est-mCnc: 137 mg/dL
Hgb A1c MFr Bld: 6.4 % — ABNORMAL HIGH (ref 4.8–5.6)

## 2021-05-02 LAB — FOLATE: Folate: >20 ng/mL (ref >3.0)

## 2021-05-02 LAB — FERRITIN: Ferritin: 73 ng/mL (ref 30–400)

## 2021-05-02 LAB — VITAMIN B12: Vitamin B-12: 1137 pg/mL (ref 232–1245)

## 2021-05-05 NOTE — Progress Notes (Signed)
  Office: [phone]  /  Fax: [phone]    Date: May 19, 2021   Appointment Start Time: 9:00am Duration: 30 minutes Provider: Glennie Isle, Psy.D. Type of Session: Individual Therapy  Location of Patient: Home Location of Provider: Provider's home (private office) Type of Contact: Telepsychological Visit via MyChart Video Visit  Session Content: Frederick Fox is a 55 y.o. male presenting for a follow-up appointment to address the previously established treatment goal of increasing coping skills. Today's appointment was a telepsychological visit due to COVID-19. Frederick Fox provided verbal consent for today's telepsychological appointment and he is aware he is responsible for securing confidentiality on his end of the session. Prior to proceeding with today's appointment, Frederick Fox physical location at the time of this appointment was obtained as well a phone number he could be reached at in the event of technical difficulties. Frederick Fox and this provider participated in today's telepsychological service.   This provider conducted a brief check-in. Frederick Fox stated he will be meeting with the surgeon for bariatric surgery next week, adding, "It's making me a little anxious." Associated thoughts and feelings were processed. He was encouraged to write down any questions prior to the appointment; he agreed. Frederick Fox added, "I've been taking this whole thing seriously." He indicated he is scheduled to attend [facility]'s bariatric surgery support group this Friday. He plans to inform his primary therapist next week about his plan for surgery. Positive reinforcement was provided.   Moreover, Frederick Fox discussed challenges with sharing with others that he plans to undergo bariatric surgery, especially his mother. This was further explored and processed. Furthermore, reviewed triggers for emotional eating behaviors. Frederick Fox identified experiencing the following trigger: stress. This provider and Frederick Fox discussed other  stress relieving strategies (e.g., reading, listening to music) and he was encouraged to reference the list he developed when experiencing stress to assist with coping. He agreed. Overall, Frederick Fox was receptive to today's appointment as evidenced by openness to sharing, responsiveness to feedback, and willingness to implement discussed strategies .  Mental Status Examination:  Appearance: well groomed and appropriate hygiene  Behavior: appropriate to circumstances Mood: euthymic Affect: mood congruent Speech: normal in rate, volume, and tone Eye Contact: appropriate Psychomotor Activity: unable to assess Gait: unable to assess Thought Process: linear, logical, and goal directed  Thought Content/Perception: no hallucinations, delusions, bizarre thinking or behavior reported or observed and no evidence or endorsement of suicidal and homicidal ideation, plan, and intent Orientation: time, person, place, and purpose of appointment Memory/Concentration: memory, attention, language, and fund of knowledge intact  Insight/Judgment: fair  Interventions:  Conducted a brief chart review Provided empathic reflections and validation Reviewed content from the previous session Processed thoughts and feelings Employed supportive psychotherapy interventions to facilitate reduced distress, and to improve coping skills with identified stressors  DSM-5 Diagnosis(es): F32.89 Other Specified Depressive Disorder, Emotional Eating Behaviors  Treatment Goal & Progress: During the initial appointment with this provider, the following treatment goal was established: increase coping skills. Frederick Fox has demonstrated progress in his goal as evidenced by increased awareness of hunger patterns and increased awareness of triggers for emotional eating behaviors.   Plan: The next appointment will be scheduled in approximately two weeks, which will be via MyChart Video Visit. The next session will focus on working towards the  established treatment goal.

## 2021-05-06 DIAGNOSIS — R710 Precipitous drop in hematocrit: Secondary | ICD-10-CM | POA: Insufficient documentation

## 2021-05-06 NOTE — Progress Notes (Signed)
Chief Complaint:   OBESITY Frederick Fox is here to discuss his progress with his obesity treatment plan along with follow-up of his obesity related diagnoses. Frederick Fox is on the Category 3 Plan and states he is following his eating plan approximately 100% of the time. Frederick Fox states he is walking 2-4 miles 45 minutes 7 times per week.  Today's visit was #: 14 Starting weight: 256 lbs Starting date: 08/06/2020 Today's weight: 253 lbs Today's date: 04/30/2021 Total lbs lost to date: 3 Total lbs lost since last in-office visit: 0  Interim History: Frederick Fox does not feel hunger is an issue for him. However, he does go over 300 calories for snacks at times.  He does admit to eating too many carbs and exceeding calories with emotional eating at times. He does get in the prescribed protein.   Subjective:   1. Type 2 diabetes mellitus with stage 4 chronic kidney disease, with long-term current use of insulin ([name] Well controlled. [name]'s last A1c was 6.4. He is on Rybelsus 14 mg. His fasting CBG's are in the low 100's and 2 hours post prandial 120's. He sees nephrology for CKD.  Lab Results  Component Value Date   HGBA1C 6.4 (H) 05/01/2021   HGBA1C 6.4 12/31/2020   HGBA1C 5.7 (H) 10/14/2020   Lab Results  Component Value Date   MICROALBUR 251.7 (H) 10/09/2008   LDLCALC 79 05/01/2021   CREATININE 2.61 (H) 05/01/2021   Lab Results  Component Value Date   INSULIN 42.0 (H) 08/06/2020   Lab Results  Component Value Date   NA 144 05/01/2021   K 4.7 05/01/2021   CREATININE 2.61 (H) 05/01/2021   GFRNONAA 23 09/09/2020   GFRAA 27 09/09/2020   GLUCOSE 104 (H) 05/01/2021    2. Vitamin D deficiency [name]'s Vit D was 8 at nephrology recently. He is on 5,000 IU daily and weekly 50,000 IU prescription Vit D. He reports fatigue at times.  Lab Results  Component Value Date   VD25OH 43.2 05/01/2021   VD25OH 46.20 12/31/2020   VD25OH 41.0 09/09/2020   3. Hemoglobin decreased His last Hgb  was slightly low at 12.7. He is up to date on colonoscopy and is not on an iron supplement.  4. Hyperlipidemia associated with type 2 diabetes mellitus ([place]) Frederick Fox's last LDL was at goal at 99. His HDL was low at 38 and triglycerides were elevated at 338. He is on pravastatin 10 mg daily.  Lab Results  Component Value Date   ALT 19 05/01/2021   AST 21 05/01/2021   ALKPHOS 67 05/01/2021   BILITOT 0.2 05/01/2021   Lab Results  Component Value Date   CHOL 176 05/01/2021   HDL 48 05/01/2021   LDLCALC 79 05/01/2021   LDLDIRECT 67.0 12/31/2020   TRIG 302 (H) 05/01/2021   CHOLHDL 4 12/31/2020  The 10-year ASCVD risk score Frederick Bussing DC Jr., et al., 2013) is: 8.1%   Values used to calculate the score:     Age: 3 years     Sex: Male     Is Non-Hispanic African American: No     Diabetic: Yes     Tobacco smoker: No     Systolic Blood Pressure: 829 mmHg     Is BP treated: Yes     HDL Cholesterol: 48 mg/dL     Total Cholesterol: 176 mg/dL  5. At risk for hyperglycemia [name] is at risk for hyperglycemia due to diabetes mellitus- too many carbs in diet.  Assessment/Plan:  1. Type 2 diabetes mellitus with stage 4 chronic kidney disease, with long-term current use of insulin ([place]) Continue Rybelsus as directed. Work on reducing carbs overall. Continue to follow up with nephrology as directed.  Check labs today.  - Hemoglobin A1c - Comprehensive metabolic panel  2. Vitamin D deficiency Low Vitamin D level contributes to fatigue and are associated with obesity, breast, and colon cancer. He agrees to continue to take prescription Vitamin D @50 ,000 IU every week and OTC Vit D. He will follow-up for routine testing of Vitamin D, at least 2-3 times per year to avoid over-replacement. Check labs today.  - VITAMIN D 25 Hydroxy (Vit-D Deficiency, Fractures)  3. Hemoglobin decreased Low Hgb is likely related to CKD. Check labs today.  - Vitamin B12 - Folate - Iron and TIBC - Ferritin - CBC  with Differential/Platelet  4. Hyperlipidemia associated with type 2 diabetes mellitus ([place]) Continue pravastatin as directed.    Counseling Intensive lifestyle modifications are the first line treatment for this issue. Dietary changes: Increase soluble fiber. Decrease simple carbohydrates. Exercise changes: Moderate to vigorous-intensity aerobic activity 150 minutes per week if tolerated. Lipid-lowering medications: see documented in medical record. Check labs today.  - Lipid Panel With LDL/HDL Ratio  5. At risk for hyperglycemia Frederick Fox was given approximately 15 minutes of counseling today regarding prevention of hyperglycemia. He was advised of hyperglycemia causes and the fact hyperglycemia is often asymptomatic. Frederick Fox was instructed to avoid skipping meals, eat regular protein rich meals and schedule low calorie but protein rich snacks as needed.   Repetitive spaced learning was employed today to elicit superior memory formation and behavioral change   6. Obesity: Current BMI 36.3  Frederick Fox is currently in the action stage of change. As such, his goal is to continue with weight loss efforts. He has agreed to the Category 3 Plan.   Continue to count extra calories and keep under 300.   Discussed bariatric surgery. He is attending seminar tonight. He was advised that bariatric surgery is a tool for weight loss and will not be successful long-term without significant dietary changes and exercise.  Exercise goals:  As is  Behavioral modification strategies: decreasing simple carbohydrates.  Frederick Fox has agreed to follow-up with our clinic in 2-3 weeks.    Objective:   Blood pressure 120/67, pulse 75, temperature 98.7 F (37.1 C), height 5\' 10"  (1.778 m), weight 253 lb (114.8 kg), SpO2 95 %. Body mass index is 36.3 kg/m.  General: Cooperative, alert, well developed, in no acute distress. HEENT: Conjunctivae and lids unremarkable. Cardiovascular: Regular rhythm.  Lungs: Normal  work of breathing. Neurologic: No focal deficits.   Lab Results  Component Value Date   CREATININE 2.61 (H) 05/01/2021   BUN 34 (H) 05/01/2021   NA 144 05/01/2021   K 4.7 05/01/2021   CL 108 (H) 05/01/2021   CO2 24 05/01/2021   Lab Results  Component Value Date   ALT 19 05/01/2021   AST 21 05/01/2021   ALKPHOS 67 05/01/2021   BILITOT 0.2 05/01/2021   Lab Results  Component Value Date   HGBA1C 6.4 (H) 05/01/2021   HGBA1C 6.4 12/31/2020   HGBA1C 5.7 (H) 10/14/2020   HGBA1C 6.0 (H) 06/04/2020   HGBA1C 6.1 12/06/2019   Lab Results  Component Value Date   INSULIN 42.0 (H) 08/06/2020   Lab Results  Component Value Date   TSH 1.060 08/06/2020   Lab Results  Component Value Date   CHOL 176 05/01/2021  HDL 48 05/01/2021   LDLCALC 79 05/01/2021   LDLDIRECT 67.0 12/31/2020   TRIG 302 (H) 05/01/2021   CHOLHDL 4 12/31/2020   Lab Results  Component Value Date   VD25OH 43.2 05/01/2021   VD25OH 46.20 12/31/2020   VD25OH 41.0 09/09/2020   Lab Results  Component Value Date   WBC 8.8 05/01/2021   HGB 13.7 05/01/2021   HCT 41.8 05/01/2021   MCV 84 05/01/2021   PLT 210 05/01/2021   Lab Results  Component Value Date   IRON 71 05/01/2021   TIBC 293 05/01/2021   FERRITIN 73 05/01/2021    Attestation Statements:   Reviewed by clinician on day of visit: allergies, medications, problem list, medical history, surgical history, family history, social history, and previous encounter notes.  Coral Ceo, CMA, am acting as Location manager for [employer], [place].  I have reviewed the above documentation for accuracy and completeness, and I agree with the above. -  Georgianne Fick, FNP

## 2021-05-12 ENCOUNTER — Other Ambulatory Visit: Payer: Self-pay

## 2021-05-12 ENCOUNTER — Ambulatory Visit (HOSPITAL_COMMUNITY): Payer: BC Managed Care – PPO | Attending: Internal Medicine

## 2021-05-12 DIAGNOSIS — R0600 Dyspnea, unspecified: Secondary | ICD-10-CM | POA: Diagnosis present

## 2021-05-12 DIAGNOSIS — R0609 Other forms of dyspnea: Secondary | ICD-10-CM

## 2021-05-12 LAB — ECHOCARDIOGRAM COMPLETE
Area-P 1/2: 1.96 cm2
S' Lateral: 3.1 cm

## 2021-05-15 ENCOUNTER — Other Ambulatory Visit: Payer: Self-pay | Admitting: Internal Medicine

## 2021-05-16 ENCOUNTER — Encounter: Payer: Self-pay | Admitting: Internal Medicine

## 2021-05-19 ENCOUNTER — Telehealth (INDEPENDENT_AMBULATORY_CARE_PROVIDER_SITE_OTHER): Payer: BC Managed Care – PPO | Admitting: Psychology

## 2021-05-19 DIAGNOSIS — F3289 Other specified depressive episodes: Secondary | ICD-10-CM

## 2021-05-20 ENCOUNTER — Other Ambulatory Visit: Payer: Self-pay

## 2021-05-20 ENCOUNTER — Encounter (INDEPENDENT_AMBULATORY_CARE_PROVIDER_SITE_OTHER): Payer: Self-pay | Admitting: Adult Health

## 2021-05-20 ENCOUNTER — Other Ambulatory Visit: Payer: Self-pay | Admitting: Internal Medicine

## 2021-05-20 ENCOUNTER — Ambulatory Visit (INDEPENDENT_AMBULATORY_CARE_PROVIDER_SITE_OTHER): Payer: BC Managed Care – PPO | Admitting: Adult Health

## 2021-05-20 VITALS — BP 112/61 | HR 75 | Temp 98.1°F | Ht 70.0 in | Wt 253.0 lb

## 2021-05-20 DIAGNOSIS — E559 Vitamin D deficiency, unspecified: Secondary | ICD-10-CM | POA: Diagnosis not present

## 2021-05-20 DIAGNOSIS — Z8241 Family history of sudden cardiac death: Secondary | ICD-10-CM

## 2021-05-20 DIAGNOSIS — E1122 Type 2 diabetes mellitus with diabetic chronic kidney disease: Secondary | ICD-10-CM | POA: Diagnosis not present

## 2021-05-20 DIAGNOSIS — I152 Hypertension secondary to endocrine disorders: Secondary | ICD-10-CM

## 2021-05-20 DIAGNOSIS — E1169 Type 2 diabetes mellitus with other specified complication: Secondary | ICD-10-CM

## 2021-05-20 DIAGNOSIS — Z9189 Other specified personal risk factors, not elsewhere classified: Secondary | ICD-10-CM

## 2021-05-20 DIAGNOSIS — Z6837 Body mass index (BMI) 37.0-37.9, adult: Secondary | ICD-10-CM

## 2021-05-20 DIAGNOSIS — E1159 Type 2 diabetes mellitus with other circulatory complications: Secondary | ICD-10-CM

## 2021-05-20 DIAGNOSIS — N184 Chronic kidney disease, stage 4 (severe): Secondary | ICD-10-CM

## 2021-05-20 DIAGNOSIS — Z794 Long term (current) use of insulin: Secondary | ICD-10-CM

## 2021-05-20 MED ORDER — VITAMIN D (ERGOCALCIFEROL) 1.25 MG (50000 UNIT) PO CAPS: 50000.0000 [IU] | ORAL_CAPSULE | ORAL | 0 refills | Status: DC

## 2021-05-20 NOTE — Progress Notes (Signed)
Office: [phone]  /  Fax: [phone]    Date: June 03, 2021   Appointment Start Time: 8:00am Duration: 32  minutes Provider: Glennie Isle, Psy.D. Type of Session: Individual Therapy  Location of Patient: Work (private room) Location of Provider: Provider's Home (private office) Type of Contact: Telepsychological Visit via MyChart Video Visit  Session Content: Frederick Fox is a 55 y.o. male presenting for a follow-up appointment to address the previously established treatment goal of increasing coping skills. Today's appointment was a telepsychological visit due to COVID-19. Pershing provided verbal consent for today's telepsychological appointment and he is aware he is responsible for securing confidentiality on his end of the session. Prior to proceeding with today's appointment, Frederick Fox's physical location at the time of this appointment was obtained as well a phone number he could be reached at in the event of technical difficulties. [place] and this provider participated in today's telepsychological service.   This provider conducted a brief check-in. Frederick Fox shared about his recent appointment with a bariatric surgeon. He discussed experiencing anxiety about what he has learned about the surgery progress and his ability to "succeed." This was further explored and processed. He was again encouraged to discuss his decision for bariatric surgery with his primary therapist. Frederick Fox acknowledged he was "afraid" to share based on his therapist's "holistic approach." Associated thoughts and feelings were processed. He indicated with more information and time, he feels he will share with his primary therapist.   Frederick Fox indicated a reduction in emotional eating behaviors, which he attributes to increased support. To further assist with coping, psychoeducation regarding pleasurable activities, including its impact on emotional eating and overall well-being was provided. Frederick Fox was provided with a handout  with various options of pleasurable activities, and was encouraged to engage in one activity a day and additional activities as needed when triggered to emotionally eat. Frederick Fox agreed. Frederick Fox provided verbal consent during today's appointment for this provider to send a handout with pleasurable activities via e-mail. Overall, Frederick Fox was receptive to today's appointment as evidenced by openness to sharing, responsiveness to feedback, and willingness to engage in pleasurable activities to assist with coping.  Mental Status Examination:  Appearance: well groomed and appropriate hygiene  Behavior: appropriate to circumstances Mood: euthymic Affect: mood congruent Speech: normal in rate, volume, and tone Eye Contact: appropriate Psychomotor Activity: appropriate Gait: unable to assess Thought Process: linear, logical, and goal directed  Thought Content/Perception: no hallucinations, delusions, bizarre thinking or behavior reported or observed and no evidence or endorsement of suicidal and homicidal ideation, plan, and intent Orientation: time, person, place, and purpose of appointment Memory/Concentration: memory, attention, language, and fund of knowledge intact  Insight/Judgment: fair  Interventions:  Conducted a brief chart review Provided empathic reflections and validation Reviewed content from the previous session Employed supportive psychotherapy interventions to facilitate reduced distress and to improve coping skills with identified stressors Employed motivational interviewing skills to assess patient's willingness/desire to adhere to recommended medical treatments and assignments Psychoeducation provided regarding pleasurable activities  DSM-5 Diagnosis(es): F32.89 Other Specified Depressive Disorder, Emotional Eating Behaviors  Treatment Goal & Progress: During the initial appointment with this provider, the following treatment goal was established: increase coping skills.  Frederick Fox  has demonstrated progress in his goal as evidenced by increased awareness of hunger patterns and increased awareness of triggers for emotional eating behaviors. Frederick Fox also continues to demonstrate willingness to engage in learned skill(s).  Plan: The next appointment will be scheduled in three weeks, which will be via MyChart Video Visit. The  next session will focus on working towards the established treatment goal. Additionally, Frederick Fox indicated his next appointment with his primary therapist is June 18, 2021.

## 2021-05-21 NOTE — Progress Notes (Signed)
Chief Complaint:   OBESITY Frederick Fox is here to discuss his progress with his obesity treatment plan along with follow-up of his obesity related diagnoses. Frederick Fox is on the Category 3 Plan and states he is following his eating plan approximately 100% of the time. Frederick Fox states he is doing cardio/walking for 40-60 minutes 7 times per week.  Today's visit was #: 15 Starting weight: 256 lbs Starting date: 08/06/2020 Today's weight: 251 lbs Today's date: 05/20/2021 Total lbs lost to date: 5 lbs Total lbs lost since last in-office visit: 2 lbs  Interim History:  Frederick Fox continues to enjoy the foods and structure of the Category 3 meal plan.   He recently attended a bariatric surgery seminar - he has a follow-up with Bariatric Surgeon, Dr. Kieth Brightly at [facility].   Subjective:   1. Hypertension associated with type 2 diabetes mellitus ([place]) On May 05, 2021, CMP showed stable electrolytes.  Blood pressure and heart rate excellent at office visit.  BP Readings from Last 3 Encounters:  05/20/21 112/61  04/30/21 120/67  04/18/21 108/70   2. Family history of sudden cardiac death On 05-May-2021, lipid panel was stable with improvement of HDL and Tgs.  His parental grandfather passed away from sudden MI at age 81.  He is on pravastatin 10 mg - recently started by PCP in August 2022.  ASCVD risk stratification is 7.2%. He denies tobacco/vape use.  3. Vitamin D deficiency ON 05-05-2021, vitamin D level was 43.2.  Still below goal.  He is currently taking prescription vitamin D 50,000 IU each week. He denies nausea, vomiting or muscle weakness.  Lab Results  Component Value Date   VD25OH 43.2 May 05, 2021   VD25OH 46.20 12/31/2020   VD25OH 41.0 09/09/2020   4. Type 2 diabetes mellitus with stage 4 chronic kidney disease, with long-term current use of insulin ([place]) He is on Rybelsus 14 mg daily.  Managed by Nephrology.  Lab Results  Component Value Date   HGBA1C 6.4 (H)  2021/05/05   HGBA1C 6.4 12/31/2020   HGBA1C 5.7 (H) 10/14/2020   Lab Results  Component Value Date   MICROALBUR 251.7 (H) 10/09/2008   LDLCALC 79 05-May-2021   CREATININE 2.61 (H) 05-05-21   Lab Results  Component Value Date   INSULIN 42.0 (H) 08/06/2020   5. CKD (chronic kidney disease) stage 4, GFR 15-29 ml/min ([name] On May 05, 2021, CMP showed kidney function slightly improved.  He is on fluid restriction of <64 ounces per day.  Lab Results  Component Value Date   CREATININE 2.61 (H) May 05, 2021   CREATININE 2.76 (H) 12/31/2020   CREATININE 2.9 (A) 09/09/2020   Lab Results  Component Value Date   CREATININE 2.61 (H) 2021-05-05   BUN 34 (H) 2021-05-05   NA 144 05-05-21   K 4.7 May 05, 2021   CL 108 (H) 05/05/21   CO2 24 May 05, 2021   6. At risk for fluid volume overload Frederick Fox is at a higher than average risk for fluid retention due to obesity and CKD.   Assessment/Plan:   1. Hypertension associated with type 2 diabetes mellitus ([place]) Continue antihypertensive therapy.  2. Family history of sudden cardiac death Follow-up with PCP about statin dose.  3. Vitamin D deficiency Refill vitamin D 50,000 IU weekly, as per below.  - Refill Vitamin D, Ergocalciferol, (DRISDOL) 1.25 MG (50000 UNIT) CAPS capsule; Take 1 capsule (50,000 Units total) by mouth every 7 (seven) days.  Dispense: 4 capsule; Refill: 0  4. Type 2 diabetes mellitus with  stage 4 chronic kidney disease, with long-term current use of insulin ([name] Continue oral GLP-1 and decrease simple carbohydrates/sugar.  5. CKD (chronic kidney disease) stage 4, GFR 15-29 ml/min ([name] Continue with Nephrology as directed.  6. At risk for fluid volume overload Frederick Fox was given approximately 15 minutes of fluid retention prevention counseling today. He is 55 y.o. male and has risk factors for fluid retention including obesity. We discussed intensive lifestyle modifications today with an emphasis on specific weight loss  instructions, proper nutrition and exercise strategies.   Repetitive spaced learning was employed today to elicit superior memory formation and behavioral change.   7. Obesity: Current BMI 36.1  Frederick Fox is currently in the action stage of change. As such, his goal is to continue with weight loss efforts. He has agreed to the Category 3 Plan.   Exercise goals:  As is.  Behavioral modification strategies: increasing lean protein intake, decreasing simple carbohydrates, meal planning and cooking strategies, keeping healthy foods in the home, and planning for success.  Frederick Fox has agreed to follow-up with our clinic in 3 weeks. He was informed of the importance of frequent follow-up visits to maximize his success with intensive lifestyle modifications for his multiple health conditions.   Objective:   Blood pressure 112/61, pulse 75, temperature 98.1 F (36.7 C), height 5\' 10"  (1.778 m), weight 253 lb (114.8 kg), SpO2 96 %. Body mass index is 36.3 kg/m.  General: Cooperative, alert, well developed, in no acute distress. HEENT: Conjunctivae and lids unremarkable. Cardiovascular: Regular rhythm.  Lungs: Normal work of breathing. Neurologic: No focal deficits.   Lab Results  Component Value Date   CREATININE 2.61 (H) 05/01/2021   BUN 34 (H) 05/01/2021   NA 144 05/01/2021   K 4.7 05/01/2021   CL 108 (H) 05/01/2021   CO2 24 05/01/2021   Lab Results  Component Value Date   ALT 19 05/01/2021   AST 21 05/01/2021   ALKPHOS 67 05/01/2021   BILITOT 0.2 05/01/2021   Lab Results  Component Value Date   HGBA1C 6.4 (H) 05/01/2021   HGBA1C 6.4 12/31/2020   HGBA1C 5.7 (H) 10/14/2020   HGBA1C 6.0 (H) 06/04/2020   HGBA1C 6.1 12/06/2019   Lab Results  Component Value Date   INSULIN 42.0 (H) 08/06/2020   Lab Results  Component Value Date   TSH 1.060 08/06/2020   Lab Results  Component Value Date   CHOL 176 05/01/2021   HDL 48 05/01/2021   LDLCALC 79 05/01/2021   LDLDIRECT 67.0  12/31/2020   TRIG 302 (H) 05/01/2021   CHOLHDL 4 12/31/2020   Lab Results  Component Value Date   VD25OH 43.2 05/01/2021   VD25OH 46.20 12/31/2020   VD25OH 41.0 09/09/2020   Lab Results  Component Value Date   WBC 8.8 05/01/2021   HGB 13.7 05/01/2021   HCT 41.8 05/01/2021   MCV 84 05/01/2021   PLT 210 05/01/2021   Lab Results  Component Value Date   IRON 71 05/01/2021   TIBC 293 05/01/2021   FERRITIN 73 05/01/2021   Attestation Statements:   Reviewed by clinician on day of visit: allergies, medications, problem list, medical history, surgical history, family history, social history, and previous encounter notes.  I, Water quality scientist, CMA, am acting as Location manager for Mina Marble, NP.  I have reviewed the above documentation for accuracy and completeness, and I agree with the above. -  Jessilyn Catino d. Zuzu Befort, NP-C

## 2021-06-03 ENCOUNTER — Telehealth (INDEPENDENT_AMBULATORY_CARE_PROVIDER_SITE_OTHER): Payer: BC Managed Care – PPO | Admitting: Psychology

## 2021-06-03 DIAGNOSIS — F3289 Other specified depressive episodes: Secondary | ICD-10-CM | POA: Diagnosis not present

## 2021-06-09 ENCOUNTER — Ambulatory Visit (INDEPENDENT_AMBULATORY_CARE_PROVIDER_SITE_OTHER): Payer: BC Managed Care – PPO | Admitting: Adult Health

## 2021-06-09 NOTE — Progress Notes (Unsigned)
  Office: [phone]  /  Fax: [phone]    Date: June 23, 2021   Appointment Start Time: *** Duration: *** minutes Provider: Glennie Isle, Psy.D. Type of Session: Individual Therapy  Location of Patient: {gbptloc:23249} Location of Provider: Provider's Home (private office) Type of Contact: Telepsychological Visit via {MyChart Video Visit  Session Content: Frederick Fox is a 55 y.o. male presenting for a follow-up appointment to address the previously established treatment goal of increasing coping skills. Today's appointment was a telepsychological visit due to COVID-19. Frederick Fox provided verbal consent for today's telepsychological appointment and he is aware he is responsible for securing confidentiality on his end of the session. Prior to proceeding with today's appointment, Frederick Fox's physical location at the time of this appointment was obtained as well a phone number he could be reached at in the event of technical difficulties. Frederick Fox and this provider participated in today's telepsychological service.   This provider conducted a brief check-in. *** Frederick Fox was receptive to today's appointment as evidenced by openness to sharing, responsiveness to feedback, and {gbreceptiveness:23401}.  Mental Status Examination:  Appearance: {Appearance:22431} Behavior: {Behavior:22445} Mood: {gbmood:21757} Affect: {Affect:22436} Speech: {Speech:22432} Eye Contact: {Eye Contact:22433} Psychomotor Activity: {Motor Activity:22434} Gait: {gbgait:23404} Thought Process: {thought process:22448}  Thought Content/Perception: {disturbances:22451} Orientation: {Orientation:22437} Memory/Concentration: {gbcognition:22449} Insight/Judgment: {Insight:22446}  Interventions:  {Interventions for Progress Notes:23405}  DSM-5 Diagnosis(es): F32.89 Other Specified Depressive Disorder, Emotional Eating Behaviors  Treatment Goal & Progress: During the initial appointment with this provider, the following  treatment goal was established: increase coping skills.  Frederick Fox has demonstrated progress in his goal as evidenced by {gbtxprogress:22839}. Frederick Fox also {gbtxprogress2:22951}.  Plan: The next appointment will be scheduled in {gbweeks:21758}, which will be {gbtxmodality:23402}. The next session will focus on {Plan for Next Appointment:23400}.

## 2021-06-22 ENCOUNTER — Encounter (INDEPENDENT_AMBULATORY_CARE_PROVIDER_SITE_OTHER): Payer: Self-pay

## 2021-06-23 ENCOUNTER — Telehealth (INDEPENDENT_AMBULATORY_CARE_PROVIDER_SITE_OTHER): Payer: BC Managed Care – PPO | Admitting: Psychology

## 2021-06-24 NOTE — Progress Notes (Signed)
Office: [phone]  /  Fax: [phone]    Date: July 08, 2021   Appointment Start Time: 4:13pm Duration: 22 minutes Provider: Glennie Fox, Psy.D. Type of Session: Individual Therapy  Location of Patient: Work (private room) Location of Provider: Provider's Home (private office) Type of Contact: Telepsychological Visit via MyChart Video Visit  Session Content: Frederick Fox is a 55 y.o. male presenting for a follow-up appointment to address the previously established treatment goal of increasing coping skills.Today's appointment was a telepsychological visit due to COVID-19. Frederick Fox provided verbal consent for today's telepsychological appointment and he is aware he is responsible for securing confidentiality on his end of the session. Prior to proceeding with today's appointment, Frederick Fox's physical location at the time of this appointment was obtained as well a phone number he could be reached at in the event of technical difficulties. Frederick Fox and this provider participated in today's telepsychological service. Of note, today's appointment was initiated late due to this provider.   This provider conducted a brief check-in. Frederick Fox shared his wife asked for a separation. He indicated he was going to put a pause on bariatric surgery, but decided he will move forward. He further shared he recently moved. Frederick Fox reported a desire to focus on mindfulness, noting he started reading a book about it recently. As such, psychoeducation regarding mindfulness was provided. A handout was provided to Frederick Fox with further information regarding mindfulness, including exercises. This provider also explained the benefit of mindfulness as it relates to emotional eating. Frederick Fox was encouraged to engage in the provided exercises between now and the next appointment with this provider. Frederick Fox agreed. During today's appointment, Frederick Fox was led through a mindfulness exercise involving his senses. Frederick Fox provided verbal  consent during today's appointment for this provider to send a handout about mindfulness via e-mail. In addition, psychoeducation regarding formal (e.g., setting aside a specific time daily to engage in an exercise) and informal (e.g., cultivating awareness in the present moment and taking a non-judgmental approach while engaging in day-to-day tasks) mindfulness was provided. Despite ongoing stressors, Frederick Fox indicated he lost eight pounds and has not engaged in emotional eating behaviors. Positive reinforcement was provided. Overall, Frederick Fox was receptive to today's appointment as evidenced by openness to sharing, responsiveness to feedback, and willingness to engage in mindfulness exercises to assist with coping.  Mental Status Examination:  Appearance: well groomed and appropriate hygiene  Behavior: appropriate to circumstances Mood: sad Affect: mood congruent Speech: normal in rate, volume, and tone Eye Contact: appropriate Psychomotor Activity: appropriate Gait: unable to assess Thought Process: linear, logical, and goal directed  Thought Content/Perception: denies suicidal and homicidal ideation, plan, and intent, no hallucinations, delusions, bizarre thinking or behavior reported or observed, and denies ideation and engagement in self-injurious behaviors Orientation: time, person, place, and purpose of appointment Memory/Concentration: memory, attention, language, and fund of knowledge intact  Insight/Judgment: good  Interventions:  Conducted a brief chart review Provided empathic reflections and validation Employed supportive psychotherapy interventions to facilitate reduced distress and to improve coping skills with identified stressors Psychoeducation provided regarding mindfulness Engaged patient in mindfulness exercise(s)  DSM-5 Diagnosis(es): F32.89 Other Specified Depressive Disorder, Emotional Eating Behaviors  Treatment Goal & Progress: During the initial appointment with  this provider, the following treatment goal was established: increase coping skills. Frederick Fox has demonstrated progress in his goal as evidenced by increased awareness of hunger patterns, increased awareness of triggers for emotional eating behaviors, and reduction in emotional eating behaviors. Frederick Fox also continues to demonstrate willingness to engage in learned skill(s).  Plan: The next appointment will be scheduled in one month, which will be via MyChart Video Visit. The next session will focus on working towards the established treatment goal. Due to ongoing stressors, this provider recommended Frederick Fox continue to meet with his primary therapist. He was receptive and noted their next appointment is on July 15, 2021.

## 2021-06-25 ENCOUNTER — Other Ambulatory Visit: Payer: Self-pay

## 2021-06-25 ENCOUNTER — Ambulatory Visit (AMBULATORY_SURGERY_CENTER): Payer: Self-pay | Admitting: *Deleted

## 2021-06-25 VITALS — Ht 70.0 in | Wt 253.0 lb

## 2021-06-25 DIAGNOSIS — Z860100 Personal history of colon polyps, unspecified: Secondary | ICD-10-CM

## 2021-06-25 MED ORDER — PLENVU 140 G PO SOLR: 1.0000 | Freq: Once | ORAL | 0 refills | Status: AC

## 2021-06-25 NOTE — Progress Notes (Signed)
Patient is here in-person for PV. Patient denies any allergies to eggs or soy. Patient denies any problems with anesthesia/sedation. Patient is not on any oxygen at home. Patient is not taking any diet/weight loss medications or blood thinners. Patient is aware of our care-partner policy and [id number] safety protocol.   EMMI education assigned to the patient for the procedure, sent to [facility].   Patient is COVID-19 vaccinated.  Plenvu Prep Prescription coupon was given to the patient. Pt aware if the cost.

## 2021-06-26 ENCOUNTER — Encounter: Payer: Self-pay | Admitting: Gastroenterology

## 2021-06-30 ENCOUNTER — Encounter (INDEPENDENT_AMBULATORY_CARE_PROVIDER_SITE_OTHER): Payer: Self-pay

## 2021-07-01 ENCOUNTER — Encounter (INDEPENDENT_AMBULATORY_CARE_PROVIDER_SITE_OTHER): Payer: Self-pay | Admitting: Adult Health

## 2021-07-01 ENCOUNTER — Ambulatory Visit (INDEPENDENT_AMBULATORY_CARE_PROVIDER_SITE_OTHER): Payer: BC Managed Care – PPO | Admitting: Adult Health

## 2021-07-01 ENCOUNTER — Other Ambulatory Visit: Payer: Self-pay

## 2021-07-01 VITALS — BP 114/64 | HR 69 | Temp 99.2°F | Ht 70.0 in | Wt 250.0 lb

## 2021-07-01 DIAGNOSIS — N184 Chronic kidney disease, stage 4 (severe): Secondary | ICD-10-CM

## 2021-07-01 DIAGNOSIS — E559 Vitamin D deficiency, unspecified: Secondary | ICD-10-CM | POA: Diagnosis not present

## 2021-07-01 DIAGNOSIS — Z9189 Other specified personal risk factors, not elsewhere classified: Secondary | ICD-10-CM | POA: Diagnosis not present

## 2021-07-01 DIAGNOSIS — Z794 Long term (current) use of insulin: Secondary | ICD-10-CM

## 2021-07-01 DIAGNOSIS — E1122 Type 2 diabetes mellitus with diabetic chronic kidney disease: Secondary | ICD-10-CM | POA: Diagnosis not present

## 2021-07-01 DIAGNOSIS — Z6837 Body mass index (BMI) 37.0-37.9, adult: Secondary | ICD-10-CM

## 2021-07-01 MED ORDER — VITAMIN D (ERGOCALCIFEROL) 1.25 MG (50000 UNIT) PO CAPS: 50000.0000 [IU] | ORAL_CAPSULE | ORAL | 0 refills | Status: DC

## 2021-07-02 NOTE — Progress Notes (Signed)
Chief Complaint:   OBESITY Frederick Fox is here to discuss his progress with his obesity treatment plan along with follow-up of his obesity related diagnoses. Frederick Fox is on the Category 3 Plan and states he is following his eating plan approximately 100% of the time. Frederick Fox states he is doing cardio for 30-60 minutes 7 times per week.  Today's visit was #: 60 Starting weight: 256 lbs Starting date: 08/06/2020 Today's weight: 250 lbs Today's date: 07/01/2021 Total lbs lost to date: 6 lbs Total lbs lost since last in-office visit: 1 lb  Interim History: Frederick Fox has been following Category 3 meal plan 100%.  Down 3 pounds since last office visit. He reports feeling appreciably better when he eats plan.  Subjective:   1. Vitamin D deficiency On 05/01/2021, vitamin D level - 43.2 - just below goal of 50.   He is currently taking prescription ergocalciferol 50,000 IU each week. He denies nausea, vomiting or muscle weakness.  Lab Results  Component Value Date   VD25OH 43.2 05/01/2021   VD25OH 46.20 12/31/2020   VD25OH 41.0 09/09/2020   2. Type 2 diabetes mellitus with stage 4 chronic kidney disease, with long-term current use of insulin ([place]) On 05/01/2021, A1c 6.4 - at goal. Ambulatory fasting BG 110s. He is on Rybelsus 14 mg daily.   He denies mass in neck, dysphagia, dyspepsia, or persistent hoarseness.  Lab Results  Component Value Date   HGBA1C 6.4 (H) 05/01/2021   HGBA1C 6.4 12/31/2020   HGBA1C 5.7 (H) 10/14/2020   Lab Results  Component Value Date   MICROALBUR 251.7 (H) 10/09/2008   LDLCALC 79 05/01/2021   CREATININE 2.61 (H) 05/01/2021   Lab Results  Component Value Date   INSULIN 42.0 (H) 08/06/2020   3. At risk for osteoporosis [place] is at higher risk of osteopenia and osteoporosis due to Vitamin D deficiency and obesity.    Assessment/Plan:   1. Vitamin D deficiency Refill ergocalciferol 50,000 IU once weekly, as per below.  - Refill Vitamin D,  Ergocalciferol, (DRISDOL) 1.25 MG (50000 UNIT) CAPS capsule; Take 1 capsule (50,000 Units total) by mouth every 7 (seven) days.  Dispense: 4 capsule; Refill: 0  2. Type 2 diabetes mellitus with stage 4 chronic kidney disease, with long-term current use of insulin ([name] Continue oral GLP-1.  3. At risk for osteoporosis Frederick Fox was given approximately 15 minutes of osteoporosis prevention counseling today. Frederick Fox is at risk for osteopenia and osteoporosis due to his Vitamin D deficiency. He was encouraged to take his Vitamin D and follow his higher calcium diet and increase strengthening exercise to help strengthen his bones and decrease his risk of osteopenia and osteoporosis.  Repetitive spaced learning was employed today to elicit superior memory formation and behavioral change.   4. Obesity: Current BMI 35.9  Marshun is currently in the action stage of change. As such, his goal is to continue with weight loss efforts. He has agreed to the Category 3 Plan.   Exercise goals:  As is.  Behavioral modification strategies: increasing lean protein intake, decreasing simple carbohydrates, meal planning and cooking strategies, keeping healthy foods in the home, and planning for success.  Frederick Fox has agreed to follow-up with our clinic in 2-3 weeks. He was informed of the importance of frequent follow-up visits to maximize his success with intensive lifestyle modifications for his multiple health conditions.   Objective:   Blood pressure 114/64, pulse 69, temperature 99.2 F (37.3 C), height 5\' 10"  (1.778 m), weight 250  lb (113.4 kg), SpO2 97 %. Body mass index is 35.87 kg/m.  General: Cooperative, alert, well developed, in no acute distress. HEENT: Conjunctivae and lids unremarkable. Cardiovascular: Regular rhythm.  Lungs: Normal work of breathing. Neurologic: No focal deficits.   Lab Results  Component Value Date   CREATININE 2.61 (H) 05/01/2021   BUN 34 (H) 05/01/2021   NA 144 05/01/2021    K 4.7 05/01/2021   CL 108 (H) 05/01/2021   CO2 24 05/01/2021   Lab Results  Component Value Date   ALT 19 05/01/2021   AST 21 05/01/2021   ALKPHOS 67 05/01/2021   BILITOT 0.2 05/01/2021   Lab Results  Component Value Date   HGBA1C 6.4 (H) 05/01/2021   HGBA1C 6.4 12/31/2020   HGBA1C 5.7 (H) 10/14/2020   HGBA1C 6.0 (H) 06/04/2020   HGBA1C 6.1 12/06/2019   Lab Results  Component Value Date   INSULIN 42.0 (H) 08/06/2020   Lab Results  Component Value Date   TSH 1.060 08/06/2020   Lab Results  Component Value Date   CHOL 176 05/01/2021   HDL 48 05/01/2021   LDLCALC 79 05/01/2021   LDLDIRECT 67.0 12/31/2020   TRIG 302 (H) 05/01/2021   CHOLHDL 4 12/31/2020   Lab Results  Component Value Date   VD25OH 43.2 05/01/2021   VD25OH 46.20 12/31/2020   VD25OH 41.0 09/09/2020   Lab Results  Component Value Date   WBC 8.8 05/01/2021   HGB 13.7 05/01/2021   HCT 41.8 05/01/2021   MCV 84 05/01/2021   PLT 210 05/01/2021   Lab Results  Component Value Date   IRON 71 05/01/2021   TIBC 293 05/01/2021   FERRITIN 73 05/01/2021   Attestation Statements:   Reviewed by clinician on day of visit: allergies, medications, problem list, medical history, surgical history, family history, social history, and previous encounter notes.  I, Water quality scientist, CMA, am acting as Location manager for Mina Marble, NP.  I have reviewed the above documentation for accuracy and completeness, and I agree with the above. -  Keyanah Kozicki d. Mc Bloodworth, NP-C

## 2021-07-07 ENCOUNTER — Ambulatory Visit: Payer: BC Managed Care – PPO | Admitting: Internal Medicine

## 2021-07-08 ENCOUNTER — Telehealth (INDEPENDENT_AMBULATORY_CARE_PROVIDER_SITE_OTHER): Payer: BC Managed Care – PPO | Admitting: Psychology

## 2021-07-08 DIAGNOSIS — F3289 Other specified depressive episodes: Secondary | ICD-10-CM

## 2021-07-09 ENCOUNTER — Encounter: Payer: Self-pay | Admitting: Gastroenterology

## 2021-07-09 ENCOUNTER — Ambulatory Visit (AMBULATORY_SURGERY_CENTER): Payer: BC Managed Care – PPO | Admitting: Gastroenterology

## 2021-07-09 VITALS — BP 156/84 | HR 51 | Temp 98.0°F | Resp 18 | Ht 70.0 in | Wt 253.0 lb

## 2021-07-09 DIAGNOSIS — Z86010 Personal history of colonic polyps: Secondary | ICD-10-CM | POA: Diagnosis present

## 2021-07-09 DIAGNOSIS — D123 Benign neoplasm of transverse colon: Secondary | ICD-10-CM

## 2021-07-09 MED ORDER — SODIUM CHLORIDE 0.9 % IV SOLN: 500.0000 mL | Freq: Once | INTRAVENOUS | Status: DC

## 2021-07-09 NOTE — Progress Notes (Signed)
To PACU, VSS. Report to [email or web address] 

## 2021-07-09 NOTE — Patient Instructions (Signed)
Handouts given for polyps, diverticulosis and hemorrhoids. Await pathology results. Resume regular medications and diet today.   YOU HAD AN ENDOSCOPIC PROCEDURE TODAY AT [facility]:   Refer to the procedure report that was given to you for any specific questions about what was found during the examination.  If the procedure report does not answer your questions, please call your gastroenterologist to clarify.  If you requested that your care partner not be given the details of your procedure findings, then the procedure report has been included in a sealed envelope for you to review at your convenience later.  YOU SHOULD EXPECT: Some feelings of bloating in the abdomen. Passage of more gas than usual.  Walking can help get rid of the air that was put into your GI tract during the procedure and reduce the bloating. If you had a lower endoscopy (such as a colonoscopy or flexible sigmoidoscopy) you may notice spotting of blood in your stool or on the toilet paper. If you underwent a bowel prep for your procedure, you may not have a normal bowel movement for a few days.  Please Note:  You might notice some irritation and congestion in your nose or some drainage.  This is from the oxygen used during your procedure.  There is no need for concern and it should clear up in a day or so.  SYMPTOMS TO REPORT IMMEDIATELY:  Following lower endoscopy (colonoscopy or flexible sigmoidoscopy):  Excessive amounts of blood in the stool  Significant tenderness or worsening of abdominal pains  Swelling of the abdomen that is new, acute  Fever of 100F or higher  For urgent or emergent issues, a gastroenterologist can be reached at any hour by calling [phone]. Do not use MyChart messaging for urgent concerns.    DIET:  We do recommend a small meal at first, but then you may proceed to your regular diet.  Drink plenty of fluids but you should avoid alcoholic beverages for 24  hours.  ACTIVITY:  You should plan to take it easy for the rest of today and you should NOT DRIVE or use heavy machinery until tomorrow (because of the sedation medicines used during the test).    FOLLOW UP: Our staff will call the number listed on your records 48-72 hours following your procedure to check on you and address any questions or concerns that you may have regarding the information given to you following your procedure. If we do not reach you, we will leave a message.  We will attempt to reach you two times.  During this call, we will ask if you have developed any symptoms of COVID 19. If you develop any symptoms (ie: fever, flu-like symptoms, shortness of breath, cough etc.) before then, please call [phone].  If you test positive for Covid 19 in the 2 weeks post procedure, please call and report this information to [name].    If any biopsies were taken you will be contacted by phone or by letter within the next 1-3 weeks.  Please call us at [phone] if you have not heard about the biopsies in 3 weeks.    SIGNATURES/CONFIDENTIALITY: You and/or your care partner have signed paperwork which will be entered into your electronic medical record.  These signatures attest to the fact that that the information above on your After Visit Summary has been reviewed and is understood.  Full responsibility of the confidentiality of this discharge information lies with you and/or your care-partner.

## 2021-07-09 NOTE — Progress Notes (Signed)
Called to room to assist during endoscopic procedure.  Patient ID and intended procedure confirmed with present staff. Received instructions for my participation in the procedure from the performing physician.  

## 2021-07-09 NOTE — Op Note (Signed)
[facility] Patient Name: Frederick Fox Procedure Date: 07/09/2021 3:26 PM MRN: [id number] Endoscopist: Mauri Pole , MD Age: 55 Referring MD:  Date of Birth: [birth year] Gender: Male Account #: [id number] Procedure:                Colonoscopy Indications:              High risk colon cancer surveillance: Personal                            history of colonic polyps, High risk colon cancer                            surveillance: Personal history of adenoma (10 mm or                            greater in size), High risk colon cancer                            surveillance: Personal history of multiple (3 or                            more) adenomas Medicines:                Monitored Anesthesia Care Procedure:                Pre-Anesthesia Assessment:                           - Prior to the procedure, a History and Physical                            was performed, and patient medications and                            allergies were reviewed. The patient's tolerance of                            previous anesthesia was also reviewed. The risks                            and benefits of the procedure and the sedation                            options and risks were discussed with the patient.                            All questions were answered, and informed consent                            was obtained. Prior Anticoagulants: The patient has                            taken no previous anticoagulant or antiplatelet  agents. ASA Grade Assessment: II - A patient with                            mild systemic disease. After reviewing the risks                            and benefits, the patient was deemed in                            satisfactory condition to undergo the procedure.                           After obtaining informed consent, the colonoscope                            was passed under direct vision. Throughout the                             procedure, the patient's blood pressure, pulse, and                            oxygen saturations were monitored continuously. The                            Olympus PCF-H190DL (#[id number]) Colonoscope was                            introduced through the anus and advanced to the the                            cecum, identified by appendiceal orifice and                            ileocecal valve. The colonoscopy was performed                            without difficulty. The patient tolerated the                            procedure well. The quality of the bowel                            preparation was excellent. The ileocecal valve,                            appendiceal orifice, and rectum were photographed. Scope In: 3:36:50 PM Scope Out: 5:46:50 PM Scope Withdrawal Time: 0 hours 6 minutes 9 seconds  Total Procedure Duration: 0 hours 10 minutes 6 seconds  Findings:                 The perianal and digital rectal examinations were                            normal.  A 16 mm polyp was found in the distal transverse                            colon. The polyp was pedunculated. The polyp was                            removed with a hot snare. Resection and retrieval                            were complete.                           A few small-mouthed diverticula were found in the                            sigmoid colon, descending colon and ascending colon.                           Non-bleeding external and internal hemorrhoids were                            found during retroflexion. The hemorrhoids were                            medium-sized. Complications:            No immediate complications. Estimated Blood Loss:     Estimated blood loss was minimal. Impression:               - One 16 mm polyp in the distal transverse colon,                            removed with a hot snare. Resected and retrieved.                           -  Diverticulosis in the sigmoid colon, in the                            descending colon and in the ascending colon.                           - Non-bleeding external and internal hemorrhoids. Recommendation:           - Patient has a contact number available for                            emergencies. The signs and symptoms of potential                            delayed complications were discussed with the                            patient. Return to normal activities tomorrow.  Written discharge instructions were provided to the                            patient.                           - Resume previous diet.                           - Continue present medications.                           - Await pathology results.                           - Repeat colonoscopy in 3 years for surveillance                            based on pathology results. Mauri Pole, MD 07/09/2021 3:49:57 PM This report has been signed electronically.

## 2021-07-09 NOTE — Progress Notes (Signed)
[facility] History and Physical   Primary Care Physician:  Binnie Rail, MD   Reason for Procedure:  History of adenomatous colon polyps  Plan:    Surveillance colonoscopy with possible interventions as needed     HPI: Frederick Fox is a very pleasant 55 y.o. male here for colonoscopy. Denies any nausea, vomiting, abdominal pain, melena or bright red blood per rectum  The risks and benefits as well as alternatives of endoscopic procedure(s) have been discussed and reviewed. All questions answered. The patient agrees to proceed.    Past Medical History:  Diagnosis Date   Asthma    Chronic kidney disease    stage 4   Constipation    Depression    Diabetes mellitus without complication ([name]    GERD (gastroesophageal reflux disease)    Gout    hx of gout   H/O hiatal hernia    Hyperlipidemia    Hypertension    Prediabetes    Proteinuria 2005   Sleep apnea    uses c-pap   SOB (shortness of breath) on exertion    Vitamin D deficiency     Past Surgical History:  Procedure Laterality Date   COLONOSCOPY  05/2018   Dr.Janean Eischen   POLYPECTOMY     RENAL BIOPSY  2005   protien in urine, no pathology- nephrologist in [place]      Prior to Admission medications   Medication Sig Start Date End Date Taking? Authorizing Provider  albuterol (PROAIR HFA) 108 (90 Base) MCG/ACT inhaler Inhale 2 puffs into the lungs every 6 (six) hours as needed for wheezing or shortness of breath. 10/15/20   Binnie Rail, MD  allopurinol (ZYLOPRIM) 300 MG tablet Take 1 tablet by mouth once daily 05/27/20   Binnie Rail, MD  amLODipine (NORVASC) 10 MG tablet Take 1 tablet by mouth once daily 05/21/21   Binnie Rail, MD  budesonide-formoterol (SYMBICORT) 80-4.5 MCG/ACT inhaler Inhale 2 puffs into the lungs 2 (two) times daily. 10/17/20   Binnie Rail, MD  Cholecalciferol (VITAMIN D3 PO) Take 5,000 Units by mouth daily.    [provider]   clomiPHENE (CLOMID) 50 MG tablet Take 50 mg by mouth daily. 03/31/20   [provider]  escitalopram (LEXAPRO) 20 MG tablet Take 1 tablet by mouth once daily 04/16/21   Binnie Rail, MD  famotidine (PEPCID) 20 MG tablet Take 2 tablets (40 mg total) by mouth daily. 08/09/19   Binnie Rail, MD  losartan (COZAAR) 100 MG tablet Take 1 tablet (100 mg total) by mouth daily. 09/14/20   Binnie Rail, MD  Multiple Vitamin (MULTIVITAMIN ADULT PO) Take by mouth.    [provider]  nebivolol (BYSTOLIC) 5 MG tablet Take 1 tablet by mouth once daily 03/04/21   Binnie Rail, MD  pravastatin (PRAVACHOL) 10 MG tablet Take 1 tablet by mouth once daily 05/16/21   Binnie Rail, MD  Semaglutide (RYBELSUS) 14 MG TABS Take 14 mg by mouth daily before breakfast. 07/17/20   Burns, Claudina Lick, MD  Turmeric 500 MG CAPS Take by mouth daily.    [provider]  Vitamin D, Ergocalciferol, (DRISDOL) 1.25 MG (50000 UNIT) CAPS capsule Take 1 capsule (50,000 Units total) by mouth every 7 (seven) days. 07/01/21   Mina Marble D, NP    Current Outpatient Medications  Medication Sig Dispense Refill   albuterol (PROAIR HFA) 108 (90 Base) MCG/ACT inhaler Inhale  2 puffs into the lungs every 6 (six) hours as needed for wheezing or shortness of breath. 1 each 5   allopurinol (ZYLOPRIM) 300 MG tablet Take 1 tablet by mouth once daily 90 tablet 3   amLODipine (NORVASC) 10 MG tablet Take 1 tablet by mouth once daily 90 tablet 0   budesonide-formoterol (SYMBICORT) 80-4.5 MCG/ACT inhaler Inhale 2 puffs into the lungs 2 (two) times daily. 1 each 5   Cholecalciferol (VITAMIN D3 PO) Take 5,000 Units by mouth daily.     clomiPHENE (CLOMID) 50 MG tablet Take 50 mg by mouth daily.     escitalopram (LEXAPRO) 20 MG tablet Take 1 tablet by mouth once daily 90 tablet 0   famotidine (PEPCID) 20 MG tablet Take 2 tablets (40 mg total) by mouth daily.     losartan (COZAAR) 100 MG tablet Take 1 tablet (100 mg total) by mouth  daily. 90 tablet 2   Multiple Vitamin (MULTIVITAMIN ADULT PO) Take by mouth.     nebivolol (BYSTOLIC) 5 MG tablet Take 1 tablet by mouth once daily 90 tablet 0   pravastatin (PRAVACHOL) 10 MG tablet Take 1 tablet by mouth once daily 90 tablet 0   Semaglutide (RYBELSUS) 14 MG TABS Take 14 mg by mouth daily before breakfast. 90 tablet 1   Turmeric 500 MG CAPS Take by mouth daily.     Vitamin D, Ergocalciferol, (DRISDOL) 1.25 MG (50000 UNIT) CAPS capsule Take 1 capsule (50,000 Units total) by mouth every 7 (seven) days. 4 capsule 0   No current facility-administered medications for this visit.    Allergies as of 07/09/2021 - Review Complete 07/01/2021  Allergen Reaction Noted   Lisinopril  10/17/2012   Crestor [rosuvastatin calcium]  12/23/2018   Wellbutrin [bupropion]  04/05/2020    Family History  Problem Relation Age of Onset   Hypertension Mother    Hyperlipidemia Mother    Sleep apnea Mother    Obesity Mother    Hypertension Father    Parkinson's disease Father    Depression Father    Anxiety disorder Father    Obesity Father    Parkinsonism Father    Healthy Brother    Brain cancer Maternal Grandfather    Heart attack Paternal Grandfather    Colon cancer Neg Hx    Colon polyps Neg Hx    Esophageal cancer Neg Hx    Stomach cancer Neg Hx    Rectal cancer Neg Hx     Social History   Socioeconomic History   Marital status: Married    Spouse name: Leamon Arnt   Number of children: 4   Years of education: 16   Highest education level: Not on file  Occupational History   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  Lives with wife and children in a 2 story home.  Has 2 stepchildren and 2 children of his own.  Works as a third [employer].     Education: college.       Social Determinants of Health   Financial Resource Strain: Not on file  Food Insecurity: Not on file  Transportation Needs: Not on file  Physical Activity: Not on file  Stress: Not on file  Social Connections: Not on file  Intimate Partner Violence: Not on file    Review of Systems:  All other review of systems negative except as mentioned in the HPI.  Physical Exam: Vital signs in last 24 hours: @VSRANGES @   General:   Alert, NAD Lungs:  Clear .   Heart:  Regular rate and rhythm Abdomen:  Soft, nontender and nondistended. Neuro/Psych:  Alert and cooperative. Normal mood and affect. A and O x 3  Reviewed labs, radiology imaging, old records and pertinent past GI work up  Patient is appropriate for planned procedure(s) and anesthesia in an ambulatory setting   K. Denzil Magnuson , MD [phone]

## 2021-07-11 ENCOUNTER — Telehealth: Payer: Self-pay

## 2021-07-11 NOTE — Telephone Encounter (Signed)
Attempted to reach pt. With follow-up call following endoscopic procedure 07/09/2021.  LM On pt. Voice mail.  Told pt. To call if he has any questions or concerns.

## 2021-07-11 NOTE — Telephone Encounter (Signed)
  Follow up Call-  Call back number 07/09/2021  Post procedure Call Back phone  # [phone]  Permission to leave phone message Yes  Some recent data might be hidden     Left message

## 2021-07-14 ENCOUNTER — Other Ambulatory Visit: Payer: Self-pay | Admitting: Internal Medicine

## 2021-07-15 ENCOUNTER — Ambulatory Visit: Payer: BC Managed Care – PPO | Admitting: Internal Medicine

## 2021-07-15 NOTE — Patient Instructions (Addendum)
    Flu immunization administered today.     Medications changes include :   none     Please followup in 6 months  

## 2021-07-15 NOTE — Progress Notes (Signed)
Subjective:    Patient ID: Frederick Fox, male    DOB: 13-Feb-1966, 55 y.o.   MRN: [id number]  This visit occurred during the SARS-CoV-2 public health emergency.  Safety protocols were in place, including screening questions prior to the visit, additional usage of staff PPE, and extensive cleaning of exam room while observing appropriate contact time as indicated for disinfecting solutions.     HPI The patient is here for follow up of their chronic medical problems, including htn, hld, DM, CKD, gout, depression, gerd, obesity  He is going to gave a roux-en-y gastric bypass.  He is still following with the healthy weight and wellness clinic  He is exercising.  He has no concerns.  Medications and allergies reviewed with patient and updated if appropriate.  Patient Active Problem List   Diagnosis Date Noted   Atypical chest pain Apr 29, 2021   Family history of sudden cardiac death 2021-04-29   Coronary artery calcification 04-29-21   Unexplained night sweats 01/13/2021   At risk for hypoglycemia 09/16/2020   Hypertension associated with type 2 diabetes mellitus ([place]) 08/21/2020   Hyperlipidemia associated with type 2 diabetes mellitus ([place]) 08/21/2020   At risk for heart disease 08/21/2020   SOB (shortness of breath) on exertion 08/06/2020   OSA on CPAP 08/06/2020   At risk for impaired metabolic function 14/48/1856   Class 2 severe obesity with serious comorbidity and body mass index (BMI) of 37.0 to 37.9 in adult ([place]) 04/05/2020   Rash 01/30/2020   Finger pain, right 12/06/2019   Joint pain 12/23/2018   Decreased libido 10/24/2018   Vitamin D deficiency 10/23/2018   GERD (gastroesophageal reflux disease) 04/22/2018   Cervical radiculopathy at C5, chronic 08/19/2017   Ulnar neuropathy of left upper extremity, mild 08/19/2017   Bilateral arm weakness 05/18/2017   Tingling 05/18/2017   Type 2 diabetes mellitus with stage 4 chronic kidney disease, with long-term  current use of insulin ([place]) 10/12/2016   Neck pain, chronic 05/13/2016   Asthma 03/24/2016   Hiatal hernia 03/24/2016   CKD (chronic kidney disease) stage 4, GFR 15-29 ml/min ([place]) 01/31/2015   High-renin essential hypertension 08/27/2013   Depression 08/22/2013   MICROSCOPIC HEMATURIA 10/23/2010   GOUT 01/10/2009   Proteinuria 10/09/2008   Essential hypertension 03/29/2008    Current Outpatient Medications on File Prior to Visit  Medication Sig Dispense Refill   albuterol (PROAIR HFA) 108 (90 Base) MCG/ACT inhaler Inhale 2 puffs into the lungs every 6 (six) hours as needed for wheezing or shortness of breath. 1 each 5   allopurinol (ZYLOPRIM) 300 MG tablet Take 1 tablet by mouth once daily 90 tablet 3   amLODipine (NORVASC) 10 MG tablet Take 1 tablet by mouth once daily 90 tablet 0   budesonide-formoterol (SYMBICORT) 80-4.5 MCG/ACT inhaler Inhale 2 puffs into the lungs 2 (two) times daily. 1 each 5   Cholecalciferol (VITAMIN D3 PO) Take 5,000 Units by mouth daily.     clomiPHENE (CLOMID) 50 MG tablet Take 50 mg by mouth daily.     escitalopram (LEXAPRO) 20 MG tablet Take 1 tablet by mouth once daily 90 tablet 0   famotidine (PEPCID) 20 MG tablet Take 2 tablets (40 mg total) by mouth daily.     losartan (COZAAR) 100 MG tablet Take 1 tablet (100 mg total) by mouth daily. 90 tablet 2   Multiple Vitamin (MULTIVITAMIN ADULT PO) Take by mouth.     nebivolol (BYSTOLIC) 5 MG tablet Take 1  tablet by mouth once daily 90 tablet 0   pravastatin (PRAVACHOL) 10 MG tablet Take 1 tablet by mouth once daily 90 tablet 0   Semaglutide (RYBELSUS) 14 MG TABS Take 14 mg by mouth daily before breakfast. 90 tablet 1   Turmeric 500 MG CAPS Take by mouth daily.     Vitamin D, Ergocalciferol, (DRISDOL) 1.25 MG (50000 UNIT) CAPS capsule Take 1 capsule (50,000 Units total) by mouth every 7 (seven) days. 4 capsule 0   No current facility-administered medications on file prior to visit.    Past Medical History:   Diagnosis Date   Asthma    Chronic kidney disease    stage 4   Constipation    Depression    Diabetes mellitus without complication ([name]    GERD (gastroesophageal reflux disease)    Gout    hx of gout   H/O hiatal hernia    Hyperlipidemia    Hypertension    Prediabetes    Proteinuria 2005   Sleep apnea    uses c-pap   SOB (shortness of breath) on exertion    Vitamin D deficiency     Past Surgical History:  Procedure Laterality Date   COLONOSCOPY  05/2018   Dr.Nandigam   POLYPECTOMY     RENAL BIOPSY  2005   protien in urine, no pathology- nephrologist in [place] EXTRACTION      Social History   Socioeconomic History   Marital status: Married    Spouse name: Leamon Arnt   Number of children: 4   Years of education: 16   Highest education level: Not on file  Occupational History   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  Lives with wife and children in a 2 story home.  Has 2 stepchildren and 2 children of his own.     Works as a third [employer].     Education: college.       Social Determinants of Health   Financial Resource Strain: Not on file  Food Insecurity: Not on file  Transportation Needs: Not on file  Physical Activity: Not on file  Stress: Not on file  Social Connections: Not on file    Family History  Problem Relation Age of Onset   Hypertension Mother    Hyperlipidemia Mother    Sleep apnea Mother    Obesity Mother    Hypertension Father    Parkinson's  disease Father    Depression Father    Anxiety disorder Father    Obesity Father    Parkinsonism Father    Healthy Brother    Brain cancer Maternal Grandfather    Heart attack Paternal Grandfather    Colon cancer Neg Hx    Colon polyps Neg Hx    Esophageal cancer Neg Hx    Stomach cancer Neg Hx    Rectal cancer Neg Hx  Review of Systems  Constitutional:  Negative for fever.  HENT:  Positive for congestion.   Respiratory:  Negative for cough, shortness of breath and wheezing.   Cardiovascular:  Negative for chest pain, palpitations and leg swelling.  Neurological:  Negative for light-headedness and headaches.      Objective:   Vitals:   07/16/21 0831  BP: 136/82  Pulse: 81  Temp: 98.4 F (36.9 C)  SpO2: 96%   BP Readings from Last 3 Encounters:  07/16/21 136/82  07/09/21 (!) 156/84  07/01/21 114/64   Wt Readings from Last 3 Encounters:  07/16/21 248 lb (112.5 kg)  07/09/21 253 lb (114.8 kg)  07/01/21 250 lb (113.4 kg)   Body mass index is 35.58 kg/m.   Physical Exam    Constitutional: Appears well-developed and well-nourished. No distress.  HENT:  Head: Normocephalic and atraumatic.  Neck: Neck supple. No tracheal deviation present. No thyromegaly present.  No cervical lymphadenopathy Cardiovascular: Normal rate, regular rhythm and normal heart sounds.   No murmur heard. No carotid bruit .  No edema Pulmonary/Chest: Effort normal and breath sounds normal. No respiratory distress. No has no wheezes. No rales.  Skin: Skin is warm and dry. Not diaphoretic.  Psychiatric: Normal mood and affect. Behavior is normal.      Assessment & Plan:    See Problem List for Assessment and Plan of chronic medical problems.

## 2021-07-16 ENCOUNTER — Other Ambulatory Visit: Payer: Self-pay

## 2021-07-16 ENCOUNTER — Ambulatory Visit: Payer: BC Managed Care – PPO | Admitting: Skilled Nursing Facility1

## 2021-07-16 ENCOUNTER — Encounter: Payer: Self-pay | Admitting: Internal Medicine

## 2021-07-16 ENCOUNTER — Encounter (INDEPENDENT_AMBULATORY_CARE_PROVIDER_SITE_OTHER): Payer: Self-pay | Admitting: Adult Health

## 2021-07-16 ENCOUNTER — Encounter (INDEPENDENT_AMBULATORY_CARE_PROVIDER_SITE_OTHER): Payer: Self-pay

## 2021-07-16 ENCOUNTER — Ambulatory Visit: Payer: BC Managed Care – PPO | Admitting: Internal Medicine

## 2021-07-16 ENCOUNTER — Ambulatory Visit (INDEPENDENT_AMBULATORY_CARE_PROVIDER_SITE_OTHER): Payer: BC Managed Care – PPO | Admitting: Adult Health

## 2021-07-16 VITALS — BP 136/82 | HR 81 | Temp 98.4°F | Ht 70.0 in | Wt 248.0 lb

## 2021-07-16 VITALS — BP 119/69 | HR 68 | Temp 98.4°F | Ht 70.0 in | Wt 246.0 lb

## 2021-07-16 DIAGNOSIS — K219 Gastro-esophageal reflux disease without esophagitis: Secondary | ICD-10-CM | POA: Diagnosis not present

## 2021-07-16 DIAGNOSIS — J4540 Moderate persistent asthma, uncomplicated: Secondary | ICD-10-CM

## 2021-07-16 DIAGNOSIS — Z794 Long term (current) use of insulin: Secondary | ICD-10-CM

## 2021-07-16 DIAGNOSIS — N184 Chronic kidney disease, stage 4 (severe): Secondary | ICD-10-CM

## 2021-07-16 DIAGNOSIS — E1169 Type 2 diabetes mellitus with other specified complication: Secondary | ICD-10-CM | POA: Diagnosis not present

## 2021-07-16 DIAGNOSIS — E1122 Type 2 diabetes mellitus with diabetic chronic kidney disease: Secondary | ICD-10-CM

## 2021-07-16 DIAGNOSIS — Z6837 Body mass index (BMI) 37.0-37.9, adult: Secondary | ICD-10-CM

## 2021-07-16 DIAGNOSIS — E785 Hyperlipidemia, unspecified: Secondary | ICD-10-CM

## 2021-07-16 DIAGNOSIS — I10 Essential (primary) hypertension: Secondary | ICD-10-CM

## 2021-07-16 DIAGNOSIS — F3289 Other specified depressive episodes: Secondary | ICD-10-CM

## 2021-07-16 DIAGNOSIS — M1A9XX0 Chronic gout, unspecified, without tophus (tophi): Secondary | ICD-10-CM

## 2021-07-16 NOTE — Assessment & Plan Note (Signed)
Chronic Regular exercise and healthy diet encouraged Check lipid panel  Continue pravastatin 10 mg daily 

## 2021-07-16 NOTE — Assessment & Plan Note (Signed)
Chronic Lab Results  Component Value Date   HGBA1C 6.4 (H) 05/01/2021   Sugars well controlled Check A1c today Continue Rybelsus 14 mg daily

## 2021-07-16 NOTE — Assessment & Plan Note (Signed)
Chronic ?Controlled, Stable ?Continue Lexapro 20 mg daily ?

## 2021-07-16 NOTE — Assessment & Plan Note (Addendum)
Chronic Following with Dr Anette Riedel is considering starting [place] or Jardiance Stable cmp

## 2021-07-16 NOTE — Assessment & Plan Note (Signed)
Chronic Blood pressure well controlled CMP Continue amlodipine 10 mg daily, losartan 100 mg daily, Bystolic 5 mg daily 

## 2021-07-16 NOTE — Assessment & Plan Note (Signed)
Chronic Denies gout flares Continue allopurinol 200 mg daily 

## 2021-07-16 NOTE — Assessment & Plan Note (Signed)
Chronic GERD controlled Continue famotidine 40 mg daily 

## 2021-07-16 NOTE — Assessment & Plan Note (Signed)
Chronic Mild, persistent controlled Continue symibcort  Bid and albuterol prn

## 2021-07-17 NOTE — Progress Notes (Signed)
No encounter

## 2021-07-22 ENCOUNTER — Encounter: Payer: Self-pay | Admitting: Gastroenterology

## 2021-07-22 NOTE — Progress Notes (Signed)
  Office: [phone]  /  Fax: [phone]    Date: August 02, 2021   Appointment Start Time: 4:00pm Duration: 30 minutes Provider: Glennie Isle, Psy.D. 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 male presenting for a follow-up appointment to address the previously established treatment goal of increasing coping skills.Today's appointment was a telepsychological visit due to COVID-19. Artin provided verbal consent for today's telepsychological appointment and he is aware he is responsible for securing confidentiality on his end of the session. Prior to proceeding with today's appointment, Jonavan's physical location at the time of this appointment was obtained as well a phone number he could be reached at in the event of technical difficulties. Tyjon and this provider participated in today's telepsychological service.   This provider conducted a brief check-in. Arsal indicated the separation is "heavy" on his mind lately; however, he discussed focusing on his well-being. He also indicated he is continuing with the bariatric surgery process, noting he lost more weight and is not engaging in emotional eating behaviors. Rector reported he continues to engage in mindfulness exercises. He was led through a mindfulness exercise (A Tastes of Mindfulness) and his experience was processed. Romin provided verbal consent during today's appointment for this provider to send a handout for today's exercise via e-mail. This provider also discussed the utilization of YouTube for mindfulness exercises (e.g., exercises by Merri Ray). Furthermore, termination planning was discussed. Lc was receptive to a follow-up appointment in 3-4 weeks and an additional follow-up/termination appointment in 3-4 weeks after that.  Overall, Gayland was receptive to today's appointment as evidenced by openness to sharing, responsiveness to feedback, and willingness to continue engaging in mindfulness exercises.  Mental Status Examination:  Appearance: well groomed and appropriate hygiene  Behavior: appropriate to circumstances Mood: sad Affect: mood congruent Speech: normal in rate, volume, and tone Eye Contact: appropriate Psychomotor Activity: appropriate Gait: unable to assess Thought Process: linear, logical, and goal directed  Thought Content/Perception: no hallucinations, delusions, bizarre thinking or behavior reported or observed and no evidence or endorsement of suicidal and homicidal ideation, plan, and intent Orientation: time, person, place, and purpose of appointment Memory/Concentration: memory, attention, language, and fund of knowledge intact  Insight/Judgment: good  Interventions:  Conducted a brief chart review Provided empathic reflections and validation Reviewed content from the previous session Employed supportive psychotherapy interventions to facilitate reduced distress and to improve coping skills with identified stressors Engaged patient in mindfulness exercise(s) Discussed termination planning  DSM-5 Diagnosis(es): F32.89 Other Specified Depressive Disorder, Emotional Eating Behaviors  Treatment Goal & Progress: During the initial appointment with this provider, the following treatment goal was established: increase coping skills. Devarius has demonstrated progress in his goal as evidenced by increased awareness of hunger patterns, increased awareness of triggers for emotional eating behaviors, and reduction in emotional eating behaviors . Mann also continues to demonstrate willingness to engage in learned skill(s).  Plan: The next appointment will be scheduled in 3-4 weeks, which will be via MyChart Video Visit. The next session will focus on working towards the established treatment goal.  Eldra continues to meet with his primary therapist.

## 2021-08-05 ENCOUNTER — Telehealth (INDEPENDENT_AMBULATORY_CARE_PROVIDER_SITE_OTHER): Payer: BC Managed Care – PPO | Admitting: Psychology

## 2021-08-05 DIAGNOSIS — F3289 Other specified depressive episodes: Secondary | ICD-10-CM

## 2021-08-07 ENCOUNTER — Ambulatory Visit (INDEPENDENT_AMBULATORY_CARE_PROVIDER_SITE_OTHER): Payer: BC Managed Care – PPO | Admitting: Adult Health

## 2021-08-07 ENCOUNTER — Other Ambulatory Visit: Payer: Self-pay

## 2021-08-07 ENCOUNTER — Encounter (INDEPENDENT_AMBULATORY_CARE_PROVIDER_SITE_OTHER): Payer: Self-pay | Admitting: Adult Health

## 2021-08-07 VITALS — BP 146/76 | HR 62 | Temp 97.0°F | Ht 70.0 in | Wt 248.0 lb

## 2021-08-07 DIAGNOSIS — N189 Chronic kidney disease, unspecified: Secondary | ICD-10-CM

## 2021-08-07 DIAGNOSIS — E1122 Type 2 diabetes mellitus with diabetic chronic kidney disease: Secondary | ICD-10-CM

## 2021-08-07 DIAGNOSIS — Z794 Long term (current) use of insulin: Secondary | ICD-10-CM

## 2021-08-07 DIAGNOSIS — N184 Chronic kidney disease, stage 4 (severe): Secondary | ICD-10-CM

## 2021-08-07 DIAGNOSIS — E559 Vitamin D deficiency, unspecified: Secondary | ICD-10-CM

## 2021-08-07 DIAGNOSIS — Z6837 Body mass index (BMI) 37.0-37.9, adult: Secondary | ICD-10-CM

## 2021-08-07 MED ORDER — VITAMIN D (ERGOCALCIFEROL) 1.25 MG (50000 UNIT) PO CAPS: 50000.0000 [IU] | ORAL_CAPSULE | ORAL | 0 refills | Status: DC

## 2021-08-11 NOTE — Progress Notes (Signed)
Chief Complaint:   OBESITY Frederick Fox is here to discuss his progress with his obesity treatment plan along with follow-up of his obesity related diagnoses. Frederick Fox is on the Category 3 Plan and states he is following his eating plan approximately 80% of the time + snacking. Frederick Fox states he is doing cardio/weights for 45-60 minutes 5 times per week.  Today's visit was #: 20 Starting weight: 256 lbs Starting date: 08/06/2020 Today's weight: 248 lbs Today's date: 08/07/2021 Total lbs lost to date: 8 lbs Total lbs lost since last in-office visit: 2 lbs  Interim History: Frederick Fox says that Dr. Upton/Nephrology started him on Farxiga 10 mg at his last OV. He reports increased snacking due to increased stress levels. He will choose veggie sticks, [employer] ice cream for snack foods.  Subjective:   1. Type 2 diabetes mellitus with stage 4 chronic kidney disease, with long-term current use of insulin (Frederick Fox) On 05/01/2021, A1c 6.4 - at goal. He is on Rybelsus 14 mg (managed by PCP). Nephrologist recently started him on Farxiga 10 mg daily.  2. Vitamin D deficiency Vitamin D level - 05/01/2021 - 43.2 - below goal of 50. He is currently taking prescription ergocalciferol IU each week. He denies nausea, vomiting or muscle weakness.  3. Chronic kidney disease, unspecified CKD stage On 08/01/2021, renal panel showed creatinine of 2.65, GFR 28.  Assessment/Plan:   1. Type 2 diabetes mellitus with stage 4 chronic kidney disease, with long-term current use of insulin (Frederick Fox) Check labs at next office visit. Continue current antidiabetic regime.  2. Vitamin D deficiency Check labs at next office visit. Refill ergocalciferol 50,000 IU once weekly.  - Refill Vitamin D, Ergocalciferol, (DRISDOL) 1.25 MG (50000 UNIT) CAPS capsule; Take 1 capsule (50,000 Units total) by mouth every 7 (seven) days.  Dispense: 4 capsule; Refill: 0  3. Chronic kidney disease, unspecified CKD stage Continue to avoid  NSAIDs. Continue regular f/u with Nephrology.  4. Obesity: Current BMI 35.7  Frederick Fox is currently in the action stage of change. As such, his goal is to continue with weight loss efforts. He has agreed to the Category 3 Plan.   Check fasting labs at next office visit.  Exercise goals:  As is.  Behavioral modification strategies: increasing lean protein intake, decreasing simple carbohydrates, meal planning and cooking strategies, keeping healthy foods in the home, and planning for success.  Frederick Fox has agreed to follow-up with our clinic in 3 weeks, fasting. He was informed of the importance of frequent follow-up visits to maximize his success with intensive lifestyle modifications for his multiple health conditions.   Objective:   Blood pressure (!) 146/76, pulse 62, temperature (!) 97 F (36.1 C), height 5\' 10"  (1.778 m), weight 248 lb (112.5 kg), SpO2 97 %. Body mass index is 35.58 kg/m.  General: Cooperative, alert, well developed, in no acute distress. HEENT: Conjunctivae and lids unremarkable. Cardiovascular: Regular rhythm.  Lungs: Normal work of breathing. Neurologic: No focal deficits.   Lab Results  Component Value Date   CREATININE 2.61 (H) 05/01/2021   BUN 34 (H) 05/01/2021   NA 144 05/01/2021   K 4.7 05/01/2021   CL 108 (H) 05/01/2021   CO2 24 05/01/2021   Lab Results  Component Value Date   ALT 19 05/01/2021   AST 21 05/01/2021   ALKPHOS 67 05/01/2021   BILITOT 0.2 05/01/2021   Lab Results  Component Value Date   HGBA1C 6.4 (H) 05/01/2021   HGBA1C 6.4 12/31/2020   HGBA1C  5.7 (H) 10/14/2020   HGBA1C 6.0 (H) 06/04/2020   HGBA1C 6.1 12/06/2019   Lab Results  Component Value Date   INSULIN 42.0 (H) 08/06/2020   Lab Results  Component Value Date   TSH 1.060 08/06/2020   Lab Results  Component Value Date   CHOL 176 05/01/2021   HDL 48 05/01/2021   LDLCALC 79 05/01/2021   LDLDIRECT 67.0 12/31/2020   TRIG 302 (H) 05/01/2021   CHOLHDL 4 12/31/2020    Lab Results  Component Value Date   VD25OH 43.2 05/01/2021   VD25OH 46.20 12/31/2020   VD25OH 41.0 09/09/2020   Lab Results  Component Value Date   WBC 8.8 05/01/2021   HGB 13.7 05/01/2021   HCT 41.8 05/01/2021   MCV 84 05/01/2021   PLT 210 05/01/2021   Lab Results  Component Value Date   IRON 71 05/01/2021   TIBC 293 05/01/2021   FERRITIN 73 05/01/2021   Attestation Statements:   Reviewed by clinician on day of visit: allergies, medications, problem list, medical history, surgical history, family history, social history, and previous encounter notes.  Time spent on visit including pre-visit chart review and post-visit care and charting was 30 minutes.   I, Water quality scientist, CMA, am acting as Location manager for Mina Marble, NP.  I have reviewed the above documentation for accuracy and completeness, and I agree with the above. -  Garner Dullea d. Barton Want, NP-C

## 2021-08-19 NOTE — Progress Notes (Signed)
  Office: [phone]  /  Fax: [phone]    Date: September 02, 2021   Appointment Start Time: 4:01pm Duration: 28 minutes Provider: Glennie Isle, Psy.D. Type of Session: Individual Therapy  Location of Patient: Work Public librarian) Location of Provider: Provider's Home (private office) Type of Contact: Telepsychological Visit via MyChart Video Visit  Session Content: Mariana is a 55 y.o. male presenting for a follow-up appointment to address the previously established treatment goal of increasing coping skills.Today's appointment was a telepsychological visit due to COVID-19. Rohail provided verbal consent for today's telepsychological appointment and he is aware he is responsible for securing confidentiality on his end of the session. Prior to proceeding with today's appointment, Trayshawn's physical location at the time of this appointment was obtained as well a phone number he could be reached at in the event of technical difficulties. Parmvir and this provider participated in today's telepsychological service.   This provider conducted a brief check-in. Sanford reported he continues to be "on a rollercoaster" with his wife, noting he is trying to not let it impact his eating habits. However, he noted a lingering cough has at times impacted his eating habits. Notably, Romone reported engaging in mindfulness exercises has been helpful with coping. He also discussed changes to date that have helped him continue to lose weight, make better choices, and engage in portion control. Session concluded with this provider sharing about an additional mindfulness exercise (urge surfing). Darian provided verbal consent during today's appointment for this provider to send the handout for the exercise via e-mail. Overall, Michail was receptive to today's appointment as evidenced by openness to sharing, responsiveness to feedback, and willingness to implement discussed strategies .  Mental Status Examination:   Appearance: well groomed and appropriate hygiene  Behavior: appropriate to circumstances Mood: euthymic Affect: mood congruent Speech: normal in rate, volume, and tone Eye Contact: appropriate Psychomotor Activity: appropriate Gait: unable to assess Thought Process: linear, logical, and goal directed  Thought Content/Perception: no hallucinations, delusions, bizarre thinking or behavior reported or observed and no evidence or endorsement of suicidal and homicidal ideation, plan, and intent Orientation: time, person, place, and purpose of appointment Memory/Concentration: memory, attention, language, and fund of knowledge intact  Insight/Judgment: good  Interventions:  Conducted a brief chart review Provided empathic reflections and validation Reviewed content from the previous session Employed supportive psychotherapy interventions to facilitate reduced distress and to improve coping skills with identified stressors Psychoeducation provided regarding mindfulness exercise  DSM-5 Diagnosis(es): F32.89 Other Specified Depressive Disorder, Emotional Eating Behaviors  Treatment Goal & Progress: During the initial appointment with this provider, the following treatment goal was established: increase coping skills. Eean has demonstrated progress in his goal as evidenced by increased awareness of hunger patterns, increased awareness of triggers for emotional eating behaviors, and reduction in emotional eating behaviors . Derren also continues to demonstrate willingness to engage in learned skill(s).  Plan: The next appointment will be scheduled in one month, which will be via MyChart Video Visit. The next session will focus on working towards the established treatment goal and termination. Alonso continues to meet with his primary therapist.

## 2021-08-26 ENCOUNTER — Encounter (INDEPENDENT_AMBULATORY_CARE_PROVIDER_SITE_OTHER): Payer: Self-pay

## 2021-08-26 ENCOUNTER — Other Ambulatory Visit: Payer: Self-pay | Admitting: Internal Medicine

## 2021-08-26 MED ORDER — ESCITALOPRAM OXALATE 20 MG PO TABS: 20.0000 mg | ORAL_TABLET | Freq: Every day | ORAL | 0 refills | Status: DC

## 2021-08-27 ENCOUNTER — Ambulatory Visit (INDEPENDENT_AMBULATORY_CARE_PROVIDER_SITE_OTHER): Payer: BC Managed Care – PPO | Admitting: Adult Health

## 2021-08-27 ENCOUNTER — Encounter (INDEPENDENT_AMBULATORY_CARE_PROVIDER_SITE_OTHER): Payer: Self-pay | Admitting: Adult Health

## 2021-08-27 ENCOUNTER — Other Ambulatory Visit: Payer: Self-pay

## 2021-08-27 VITALS — BP 118/72 | HR 68 | Temp 99.3°F | Ht 70.0 in | Wt 245.0 lb

## 2021-08-27 DIAGNOSIS — N184 Chronic kidney disease, stage 4 (severe): Secondary | ICD-10-CM

## 2021-08-27 DIAGNOSIS — E559 Vitamin D deficiency, unspecified: Secondary | ICD-10-CM

## 2021-08-27 DIAGNOSIS — I152 Hypertension secondary to endocrine disorders: Secondary | ICD-10-CM

## 2021-08-27 DIAGNOSIS — E1159 Type 2 diabetes mellitus with other circulatory complications: Secondary | ICD-10-CM

## 2021-08-27 DIAGNOSIS — E66812 Obesity, class 2: Secondary | ICD-10-CM

## 2021-08-27 DIAGNOSIS — Z9189 Other specified personal risk factors, not elsewhere classified: Secondary | ICD-10-CM

## 2021-08-27 DIAGNOSIS — Z794 Long term (current) use of insulin: Secondary | ICD-10-CM

## 2021-08-27 DIAGNOSIS — E1122 Type 2 diabetes mellitus with diabetic chronic kidney disease: Secondary | ICD-10-CM | POA: Diagnosis not present

## 2021-08-27 DIAGNOSIS — Z6837 Body mass index (BMI) 37.0-37.9, adult: Secondary | ICD-10-CM

## 2021-08-27 MED ORDER — VITAMIN D (ERGOCALCIFEROL) 1.25 MG (50000 UNIT) PO CAPS: 50000.0000 [IU] | ORAL_CAPSULE | ORAL | 0 refills | Status: DC

## 2021-08-28 LAB — COMPREHENSIVE METABOLIC PANEL
ALT: 22 IU/L (ref 0–44)
AST: 25 IU/L (ref 0–40)
Albumin/Globulin Ratio: 2 (ref 1.2–2.2)
Albumin: 4.1 g/dL (ref 3.8–4.9)
Alkaline Phosphatase: 64 IU/L (ref 44–121)
BUN/Creatinine Ratio: 14 (ref 9–20)
BUN: 38 mg/dL — ABNORMAL HIGH (ref 6–24)
Bilirubin Total: 0.2 mg/dL (ref 0.0–1.2)
CO2: 22 mmol/L (ref 20–29)
Calcium: 9 mg/dL (ref 8.7–10.2)
Chloride: 100 mmol/L (ref 96–106)
Creatinine, Ser: 2.81 mg/dL — ABNORMAL HIGH (ref 0.76–1.27)
Globulin, Total: 2.1 g/dL (ref 1.5–4.5)
Glucose: 106 mg/dL — ABNORMAL HIGH (ref 70–99)
Potassium: 4.5 mmol/L (ref 3.5–5.2)
Sodium: 139 mmol/L (ref 134–144)
Total Protein: 6.2 g/dL (ref 6.0–8.5)
eGFR: 26 mL/min/{1.73_m2} — ABNORMAL LOW (ref >59)

## 2021-08-28 LAB — VITAMIN D 25 HYDROXY (VIT D DEFICIENCY, FRACTURES): Vit D, 25-Hydroxy: 44.3 ng/mL (ref 30.0–100.0)

## 2021-08-28 LAB — INSULIN, RANDOM: INSULIN: 32.5 u[IU]/mL — ABNORMAL HIGH (ref 2.6–24.9)

## 2021-08-28 LAB — HEMOGLOBIN A1C
Est. average glucose Bld gHb Est-mCnc: 163 mg/dL
Hgb A1c MFr Bld: 7.3 % — ABNORMAL HIGH (ref 4.8–5.6)

## 2021-08-28 NOTE — Progress Notes (Signed)
Chief Complaint:   OBESITY Frederick Fox is here to discuss his progress with his obesity treatment plan along with follow-up of his obesity related diagnoses. Frederick Fox is on the Category 3 Plan and states he is following his eating plan approximately 50% of the time. Frederick Fox states he is not exercising regularly.  Today's visit was #: 44 Starting weight: 256 lbs Starting date: 08/06/2020 Today's weight: 245 lbs Today's date: 08/27/2021 Total lbs lost to date: 11 lbs Total lbs lost since last in-office visit: 3 lbs  Interim History:  Per Nephrology, Frederick Fox is on fluid restriction of 100 ounces per day.   Recently recovering from URI - treated by Pulmonology - Frederick Fox and Coricidin.   Of note:  Considering bariatric surgery in June 2023.  Subjective:   1. Vitamin D deficiency On 05/01/2021, vitamin D level - 43.2 - below goal of 50. He is currently taking prescription ergocalciferol 50,000 IU each week. He denies nausea, vomiting or muscle weakness.  2. Type 2 diabetes mellitus with stage 4 chronic kidney disease, with long-term current use of insulin ([place]) He is on Rybelsus 14mg  QD PCP) and Wilder Glade 10mg  (Nephrology).  3. Hypertension associated with type 2 diabetes mellitus ([place]) Followed closely by Nephrology/[facility]. [facility].  4. At risk for heart disease [name] is at a higher than average risk for cardiovascular disease due to hypertension and T2D.   Assessment/Plan:   1. Vitamin D deficiency Check labs today. Refill ergocalciferol 50,000 IU once weekly, as per below.  - VITAMIN D 25 Hydroxy (Vit-D Deficiency, Fractures) - Refill Vitamin D, Ergocalciferol, (DRISDOL) 1.25 MG (50000 UNIT) CAPS capsule; Take 1 capsule (50,000 Units total) by mouth every 7 (seven) days.  Dispense: 4 capsule; Refill: 0  2. Type 2 diabetes mellitus with stage 4 chronic kidney disease, with long-term current use of insulin ([place]) Check labs today.  - Hemoglobin A1c -  Insulin, random  3. Hypertension associated with type 2 diabetes mellitus ([place]) Check labs today.  - Comprehensive metabolic panel  4. At risk for heart disease Frederick Fox was given approximately 15 minutes of coronary artery disease prevention counseling today. He is 55 y.o. male and has risk factors for heart disease including obesity. We discussed intensive lifestyle modifications today with an emphasis on specific weight loss instructions and strategies.   Repetitive spaced learning was employed today to elicit superior memory formation and behavioral change.   5. Obesity: Current BMI 35.3  Fredi is currently in the action stage of change. As such, his goal is to continue with weight loss efforts. He has agreed to the Category 3 Plan.   Exercise goals:  Activity as tolerated.  Behavioral modification strategies: increasing lean protein intake, decreasing simple carbohydrates, meal planning and cooking strategies, keeping healthy foods in the home, and planning for success.  Shivansh has agreed to follow-up with our clinic in 4 weeks. He was informed of the importance of frequent follow-up visits to maximize his success with intensive lifestyle modifications for his multiple health conditions.   Harun was informed we would discuss his lab results at his next visit unless there is a critical issue that needs to be addressed sooner. Jru agreed to keep his next visit at the agreed upon time to discuss these results.  Objective:   Blood pressure 118/72, pulse 68, temperature 99.3 F (37.4 C), height 5\' 10"  (1.778 m), weight 245 lb (111.1 kg), SpO2 98 %. Body mass index is 35.15 kg/m.  General: Cooperative, alert,  well developed, in no acute distress. HEENT: Conjunctivae and lids unremarkable. Cardiovascular: Regular rhythm.  Lungs: Normal work of breathing. Neurologic: No focal deficits.   Lab Results  Component Value Date   CREATININE 2.81 (H) 08/27/2021   BUN 38 (H) 08/27/2021    NA 139 08/27/2021   K 4.5 08/27/2021   CL 100 08/27/2021   CO2 22 08/27/2021   Lab Results  Component Value Date   ALT 22 08/27/2021   AST 25 08/27/2021   ALKPHOS 64 08/27/2021   BILITOT 0.2 08/27/2021   Lab Results  Component Value Date   HGBA1C 7.3 (H) 08/27/2021   HGBA1C 6.4 (H) 05/01/2021   HGBA1C 6.4 12/31/2020   HGBA1C 5.7 (H) 10/14/2020   HGBA1C 6.0 (H) 06/04/2020   Lab Results  Component Value Date   INSULIN 32.5 (H) 08/27/2021   INSULIN 42.0 (H) 08/06/2020   Lab Results  Component Value Date   TSH 1.060 08/06/2020   Lab Results  Component Value Date   CHOL 176 05/01/2021   HDL 48 05/01/2021   LDLCALC 79 05/01/2021   LDLDIRECT 67.0 12/31/2020   TRIG 302 (H) 05/01/2021   CHOLHDL 4 12/31/2020   Lab Results  Component Value Date   VD25OH 44.3 08/27/2021   VD25OH 43.2 05/01/2021   VD25OH 46.20 12/31/2020   Lab Results  Component Value Date   WBC 8.8 05/01/2021   HGB 13.7 05/01/2021   HCT 41.8 05/01/2021   MCV 84 05/01/2021   PLT 210 05/01/2021   Lab Results  Component Value Date   IRON 71 05/01/2021   TIBC 293 05/01/2021   FERRITIN 73 05/01/2021   Attestation Statements:   Reviewed by clinician on day of visit: allergies, medications, problem list, medical history, surgical history, family history, social history, and previous encounter notes.  I, Water quality scientist, CMA, am acting as Location manager for Mina Marble, NP.  I have reviewed the above documentation for accuracy and completeness, and I agree with the above. -  Cana Mignano d. Mckenna Boruff, NP-C

## 2021-09-02 ENCOUNTER — Telehealth (INDEPENDENT_AMBULATORY_CARE_PROVIDER_SITE_OTHER): Payer: BC Managed Care – PPO | Admitting: Psychology

## 2021-09-02 DIAGNOSIS — F3289 Other specified depressive episodes: Secondary | ICD-10-CM

## 2021-09-03 ENCOUNTER — Encounter: Payer: BC Managed Care – PPO | Attending: General Surgery | Admitting: Skilled Nursing Facility1

## 2021-09-03 ENCOUNTER — Encounter: Payer: Self-pay | Admitting: Skilled Nursing Facility1

## 2021-09-03 ENCOUNTER — Other Ambulatory Visit: Payer: Self-pay

## 2021-09-03 DIAGNOSIS — N184 Chronic kidney disease, stage 4 (severe): Secondary | ICD-10-CM | POA: Diagnosis present

## 2021-09-03 DIAGNOSIS — E1122 Type 2 diabetes mellitus with diabetic chronic kidney disease: Secondary | ICD-10-CM | POA: Diagnosis present

## 2021-09-03 DIAGNOSIS — E669 Obesity, unspecified: Secondary | ICD-10-CM | POA: Insufficient documentation

## 2021-09-03 DIAGNOSIS — Z794 Long term (current) use of insulin: Secondary | ICD-10-CM | POA: Insufficient documentation

## 2021-09-03 NOTE — Progress Notes (Signed)
Nutrition Assessment for Bariatric Surgery Medical Nutrition Therapy   Patient was seen on 09/03/2021 for Pre-Operative Nutrition Assessment. Letter of approval faxed to [facility] Surgery bariatric surgery program coordinator on 09/03/2021.   Referral stated Supervised Weight Loss (SWL) visits needed: 0  Pt completed visits.   Pt has cleared nutrition requirements.    Planned surgery: RYGB Pt expectation of surgery: to lose weight Pt expectation of dietitian: none identified     NUTRITION ASSESSMENT   Anthropometrics  Start weight at [facility]: 251.3 lbs (date: 09/03/2021)  Height: 70 in BMI: 36.04 kg/m2     Clinical  Medical hx: Depression, stage 3 kidney disease, sleep apnea, GERD, DM Medications: see list: vitamin D  Labs: A1C 7.3, BUN 38, creatinine 2.81, GFR 26  Notable signs/symptoms: currently a cough; chronic reflux  Any previous deficiencies? Yes, vitamin d  Micronutrient Nutrition Focused Physical Exam: Hair: No issues observed Eyes: No issues observed Mouth: No issues observed Neck: No issues observed Nails: No issues observed Skin: No issues observed  Lifestyle & Dietary Hx  Pt states he checks his blood sugars about 3-4 times a week: 110 (fasting). 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  Pt states he walks about 2-4 miles per week.  Pt states he has been going to healthy weight and wellness for over a year.  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  Pt states he stays about 300 calories per meal. Pt states he sometimes drinks a orgain.   24-Hr Dietary Recall First Meal: plant based protein burrito (19g) + fruit Snack:  Second Meal: deli meat + cheese + carrots or celery sometimes broccoli + fruit Snack: serving size of veggie sticks Third Meal: chicken or pork + vegetables or salad + sugar free popciccle  Snack:  Beverages: water, alcohol   NUTRITION DIAGNOSIS  Overweight/obesity (-3.3) related to past poor dietary habits  and physical inactivity as evidenced by patient w/ planned RYGB surgery following dietary guidelines for continued weight loss.    NUTRITION INTERVENTION  Nutrition counseling (C-1) and education (E-2) to facilitate bariatric surgery goals.  Educated pt on micronutrient deficiencies post surgery and strategies to mitigate that risk   Pre-Op Goals Reviewed with the Patient Track food and beverage intake (pen and paper, MyFitness Pal, Baritastic app, etc.) Make healthy food choices while monitoring portion sizes Consume 3 meals per day or try to eat every 3-5 hours Avoid concentrated sugars and fried foods Keep sugar & fat in the single digits per serving on food labels Practice CHEWING your food (aim for applesauce consistency) Practice not drinking 15 minutes before, during, and 30 minutes after each meal and snack Avoid all carbonated beverages (ex: soda, sparkling beverages)  Limit caffeinated beverages (ex: coffee, tea, energy drinks) Avoid all sugar-sweetened beverages (ex: regular soda, sports drinks)  Avoid alcohol  Aim for 64-100 ounces of FLUID daily (with at least half of fluid intake being plain water)  Aim for at least 60-80 grams of PROTEIN daily Look for a liquid protein source that contains ?15 g protein and ?5 g carbohydrate (ex: shakes, drinks, shots) Make a list of non-food related activities Physical activity is an important part of a healthy lifestyle so keep it moving! The goal is to reach 150 minutes of exercise per week, including cardiovascular and weight baring activity.  *Goals that are bolded indicate the pt would like to start working towards these  Handouts Provided Include  Bariatric Surgery handouts (Nutrition  Visits, Pre-Op Goals, Protein Shakes, Vitamins & Minerals)  Learning Style & Readiness for Change Teaching method utilized: Visual & Auditory  Demonstrated degree of understanding via: Teach Back  Readiness Level: action Barriers to  learning/adherence to lifestyle change: none identified      MONITORING & EVALUATION Dietary intake, weekly physical activity, body weight, and pre-op goals reached at next nutrition visit.    Next Steps  Patient is to follow up at [facility] for Pre-Op Class >2 weeks before surgery for further nutrition education.  Pt has completed visits. No further supervised visits required/recomended

## 2021-09-16 NOTE — Progress Notes (Signed)
  Office: [phone]  /  Fax: [phone]    Date: September 30, 2021   Appointment Start Time: 10:01am Duration: 33 minutes Provider: Glennie Isle, Psy.D. Type of Session: Individual Therapy  Location of Patient: Home (private location) Location of Provider: Provider's Home (private office) Type of Contact: Telepsychological Visit via MyChart Video Visit  Session Content: Frederick Fox is a 55 y.o. male presenting for a follow-up appointment to address the previously established treatment goal of increasing coping skills.Today's appointment was a telepsychological visit due to COVID-19. Frederick Fox provided verbal consent for today's telepsychological appointment and he is aware he is responsible for securing confidentiality on his end of the session. Prior to proceeding with today's appointment, Frederick Fox's physical location at the time of this appointment was obtained as well a phone number he could be reached at in the event of technical difficulties. Frederick Fox and this provider participated in today's telepsychological service. Of note, today's appointment was switched to a regular telephone call at 10:03am with Frederick Fox's verbal consent due to technical issues on Frederick Fox's end.   This provider conducted a brief check-in. Frederick Fox shared about recent events, including his wife stating their "marriage is over." He further shared he stopped consuming alcohol. He indicated the recent changes/stressors have not impacted his eating habits. Positive reinforcement was provided. Frederick Fox further reflected on progress to date (e.g., increase in physical activity, increase in self-awareness/mindfulness, increase in self-compassion). A plan was developed to help Frederick Fox cope with emotional eating triggers/behaviors in the future using learned skills. He wrote down the following plan: focus on hydration; be prepared with snacks congruent to the meal plan; pause to ask questions when triggered to eat (e.g., Am I really hungry?, Is  there something bothering me?, and Will I feel better if I eat?); and engage in discussed coping strategies after going through the aforementioned questions. Overall, Frederick Fox was receptive to today's appointment as evidenced by openness to sharing, responsiveness to feedback, and willingness to continue engaging in learned skills.  Mental Status Examination:  Appearance: well groomed and appropriate hygiene  Behavior: appropriate to circumstances Mood: neutral Affect: mood congruent Speech: WNL Eye Contact: appropriate Psychomotor Activity: WNL Gait: unable to assess Thought Process: linear, logical, and goal directed and denies suicidal, homicidal, and self-harm ideation, plan and intent  Thought Content/Perception: no hallucinations, delusions, bizarre thinking or behavior endorsed or observed Orientation: AAOx4 Memory/Concentration: memory, attention, language, and fund of knowledge intact  Insight/Judgment: good  Interventions:  Conducted a brief chart review Provided empathic reflections and validation Employed supportive psychotherapy interventions to facilitate reduced distress and to improve coping skills with identified stressors Reviewed learned skills  DSM-5 Diagnosis(es): F32.89 Other Specified Depressive Disorder, Emotional Eating Behaviors  Treatment Goal & Progress: During the initial appointment with this provider, the following treatment goal was established: increase coping skills. Frederick Fox demonstrated progress in his goal as evidenced by increased awareness of hunger patterns, increased awareness of triggers for emotional eating behaviors, and reduction in emotional eating behaviors . Frederick Fox also continues to demonstrate willingness to engage in learned skill(s).  Plan: As previously planned, today was Frederick Fox's last appointment with this provider. He acknowledged understanding that he may request a follow-up appointment with this provider in the future as long as he is  still established with the clinic. No further follow-up planned by this provider.

## 2021-09-22 ENCOUNTER — Other Ambulatory Visit: Payer: Self-pay | Admitting: Internal Medicine

## 2021-09-29 ENCOUNTER — Ambulatory Visit (INDEPENDENT_AMBULATORY_CARE_PROVIDER_SITE_OTHER): Payer: BC Managed Care – PPO | Admitting: Adult Health

## 2021-09-29 ENCOUNTER — Other Ambulatory Visit: Payer: Self-pay

## 2021-09-29 ENCOUNTER — Encounter (INDEPENDENT_AMBULATORY_CARE_PROVIDER_SITE_OTHER): Payer: Self-pay | Admitting: Adult Health

## 2021-09-29 VITALS — BP 111/79 | HR 79 | Temp 98.3°F | Ht 70.0 in | Wt 244.0 lb

## 2021-09-29 DIAGNOSIS — E559 Vitamin D deficiency, unspecified: Secondary | ICD-10-CM

## 2021-09-29 DIAGNOSIS — N184 Chronic kidney disease, stage 4 (severe): Secondary | ICD-10-CM | POA: Diagnosis not present

## 2021-09-29 DIAGNOSIS — E1122 Type 2 diabetes mellitus with diabetic chronic kidney disease: Secondary | ICD-10-CM | POA: Diagnosis not present

## 2021-09-29 DIAGNOSIS — Z6837 Body mass index (BMI) 37.0-37.9, adult: Secondary | ICD-10-CM

## 2021-09-29 DIAGNOSIS — Z794 Long term (current) use of insulin: Secondary | ICD-10-CM

## 2021-09-29 DIAGNOSIS — E66812 Obesity, class 2: Secondary | ICD-10-CM

## 2021-09-29 NOTE — Progress Notes (Signed)
Chief Complaint:   OBESITY Frederick Fox is here to discuss his progress with his obesity treatment plan along with follow-up of his obesity related diagnoses. Frederick Fox is on the Category 3 Plan and states he is following his eating plan approximately 90% of the time. Frederick Fox states he is doing cardio for 30-45 minutes 4-5 times per week.  Today's visit was #: 18 Starting weight: 256 lbs Starting date: 08/06/2020 Today's weight: 244 lbs Today's date: 09/29/2021 Total lbs lost to date: 12 lbs Total lbs lost since last in-office visit: 1 lb  Interim History:  Frederick Fox reports increased emotional snacking.   He will often choose the following items to snack on: Sugar-free Jello, pretzels, Veggie Straws - healthier options, however he will consume larger portion sizes than a single serving. He has stopped drinking all ETOH - 3 weeks ago.  He is surprised that he has not lost more weight with this one lifestyle modification. Nephrology decreased his fluid intake to 64 ounces per day.  Subjective:   1. Type 2 diabetes mellitus with stage 4 chronic kidney disease, with long-term current use of insulin ([name] Ambulatory fasting BG 100-110s. He is on Rybelsus 14 mg daily - managed by his PCP. Farxiga 10 mg daily - managed by Neprhology. On 08/27/2021, A1c was 7.3 - just above goal of 7.  2. Vitamin D deficiency He is currently taking prescription ergocalciferol 50,000 IU each week. He denies nausea, vomiting or muscle weakness.  Assessment/Plan:   1. Type 2 diabetes mellitus with stage 4 chronic kidney disease, with long-term current use of insulin ([name] Continue current antidiabetic regimen. Continue to monitor ambulatory blood glucose. MyChart message sent with specifics on Diabetic diet.  2. Vitamin D deficiency Continue ergocalciferol.  Denies need for refill.   3. Obesity: Current BMI 35.1  Frederick Fox is currently in the action stage of change. As such, his goal is to continue with  weight loss efforts. He has agreed to the Category 3 Plan.   Exercise goals:  As is.  Behavioral modification strategies: increasing lean protein intake, decreasing simple carbohydrates, meal planning and cooking strategies, keeping healthy foods in the home, and planning for success.  Frederick Fox has agreed to follow-up with our clinic in 3 weeks, with IC. He was informed of the importance of frequent follow-up visits to maximize his success with intensive lifestyle modifications for his multiple health conditions.   Objective:   Blood pressure 111/79, pulse 79, temperature 98.3 F (36.8 C), height 5\' 10"  (1.778 m), weight 244 lb (110.7 kg), SpO2 97 %. Body mass index is 35.01 kg/m.  General: Cooperative, alert, well developed, in no acute distress. HEENT: Conjunctivae and lids unremarkable. Cardiovascular: Regular rhythm.  Lungs: Normal work of breathing. Neurologic: No focal deficits.   Lab Results  Component Value Date   CREATININE 2.81 (H) 08/27/2021   BUN 38 (H) 08/27/2021   NA 139 08/27/2021   K 4.5 08/27/2021   CL 100 08/27/2021   CO2 22 08/27/2021   Lab Results  Component Value Date   ALT 22 08/27/2021   AST 25 08/27/2021   ALKPHOS 64 08/27/2021   BILITOT 0.2 08/27/2021   Lab Results  Component Value Date   HGBA1C 7.3 (H) 08/27/2021   HGBA1C 6.4 (H) 05/01/2021   HGBA1C 6.4 12/31/2020   HGBA1C 5.7 (H) 10/14/2020   HGBA1C 6.0 (H) 06/04/2020   Lab Results  Component Value Date   INSULIN 32.5 (H) 08/27/2021   INSULIN 42.0 (H) 08/06/2020  Lab Results  Component Value Date   TSH 1.060 08/06/2020   Lab Results  Component Value Date   CHOL 176 05/01/2021   HDL 48 05/01/2021   LDLCALC 79 05/01/2021   LDLDIRECT 67.0 12/31/2020   TRIG 302 (H) 05/01/2021   CHOLHDL 4 12/31/2020   Lab Results  Component Value Date   VD25OH 44.3 08/27/2021   VD25OH 43.2 05/01/2021   VD25OH 46.20 12/31/2020   Lab Results  Component Value Date   WBC 8.8 05/01/2021   HGB  13.7 05/01/2021   HCT 41.8 05/01/2021   MCV 84 05/01/2021   PLT 210 05/01/2021   Lab Results  Component Value Date   IRON 71 05/01/2021   TIBC 293 05/01/2021   FERRITIN 73 05/01/2021   Attestation Statements:   Reviewed by clinician on day of visit: allergies, medications, problem list, medical history, surgical history, family history, social history, and previous encounter notes.  Time spent on visit including pre-visit chart review and post-visit care and charting was 28 minutes.   I, Water quality scientist, CMA, am acting as Location manager for Mina Marble, NP.  I have reviewed the above documentation for accuracy and completeness, and I agree with the above. -  Ceejay Kegley d. Haila Dena, NP-C

## 2021-09-30 ENCOUNTER — Telehealth (INDEPENDENT_AMBULATORY_CARE_PROVIDER_SITE_OTHER): Payer: BC Managed Care – PPO | Admitting: Psychology

## 2021-09-30 DIAGNOSIS — F3289 Other specified depressive episodes: Secondary | ICD-10-CM

## 2021-10-21 ENCOUNTER — Encounter (INDEPENDENT_AMBULATORY_CARE_PROVIDER_SITE_OTHER): Payer: Self-pay

## 2021-10-21 ENCOUNTER — Encounter (INDEPENDENT_AMBULATORY_CARE_PROVIDER_SITE_OTHER): Payer: Self-pay | Admitting: Adult Health

## 2021-10-21 ENCOUNTER — Other Ambulatory Visit: Payer: Self-pay

## 2021-10-21 ENCOUNTER — Ambulatory Visit (INDEPENDENT_AMBULATORY_CARE_PROVIDER_SITE_OTHER): Payer: BC Managed Care – PPO | Admitting: Adult Health

## 2021-10-21 VITALS — BP 103/65 | HR 77 | Temp 98.2°F | Ht 70.0 in | Wt 243.0 lb

## 2021-10-21 DIAGNOSIS — E559 Vitamin D deficiency, unspecified: Secondary | ICD-10-CM | POA: Diagnosis not present

## 2021-10-21 DIAGNOSIS — R0602 Shortness of breath: Secondary | ICD-10-CM | POA: Diagnosis not present

## 2021-10-21 DIAGNOSIS — Z9189 Other specified personal risk factors, not elsewhere classified: Secondary | ICD-10-CM | POA: Diagnosis not present

## 2021-10-21 DIAGNOSIS — Z6834 Body mass index (BMI) 34.0-34.9, adult: Secondary | ICD-10-CM

## 2021-10-21 MED ORDER — VITAMIN D (ERGOCALCIFEROL) 1.25 MG (50000 UNIT) PO CAPS: 50000.0000 [IU] | ORAL_CAPSULE | ORAL | 0 refills | Status: DC

## 2021-10-22 NOTE — Progress Notes (Addendum)
Chief Complaint:   OBESITY Diarra is here to discuss his progress with his obesity treatment plan along with follow-up of his obesity related diagnoses. Michae is on the Category 3 Plan and states he is following his eating plan approximately 100% of the time. Marina states he is doing cardio/lifting weights for 45 minutes 3-4 times per week.  Today's visit was #: 4 Starting weight: 256 lbs Starting date: 08/06/2020 Today's weight: 243 lbs Today's date: 10/21/2020 Total lbs lost to date: 13 lbs Total lbs lost since last in-office visit: 1 lb  Interim History:  On 08/06/2020, RMR 2363. RMR today 2491, metabolism increase by 123 calories . 2023 goals - decrease abdominal adiposity/decrease number of prescriptions (if appropriate).  Subjective:   1. SOB (shortness of breath) on exertion He reports SOB with extreme exertion, denies CP.  2. Vitamin D deficiency Vitamin D level on 08/27/2021 - 44.3 - well below goal of 50. He is currently taking prescription ergocalciferol 50,000 IU each week. He denies nausea, vomiting or muscle weakness.  3. At risk for deficient intake of food Kongmeng is at risk for deficient intake of food due to increased metabolic rate.  Assessment/Plan:   1. SOB (shortness of breath) on exertion Check IC today.  2. Vitamin D deficiency Refill ergocalciferol 50,000 IU once weekly.  - Refill Vitamin D, Ergocalciferol, (DRISDOL) 1.25 MG (50000 UNIT) CAPS capsule; Take 1 capsule (50,000 Units total) by mouth every 7 (seven) days.  Dispense: 4 capsule; Refill: 0  3. At risk for deficient intake of food Breylan was given approximately 15 minutes of deficit intake of food prevention counseling today. Jashaun is at risk for eating too few calories based on current food recall. He was encouraged to focus on meeting caloric and protein goals according to his recommended meal plan.   4. Obesity: Current BMI 34.9  Tedric is currently in the action stage of  change. As such, his goal is to continue with weight loss efforts. He has agreed to the Category 4 Plan.   Change from Category 3 to Category 4 to account for increased metabolic rate.  Exercise goals:  As is.  Behavioral modification strategies: increasing lean protein intake, decreasing simple carbohydrates, meal planning and cooking strategies, keeping healthy foods in the home, and planning for success.  Keithon has agreed to follow-up with our clinic in 3-4 weeks. He was informed of the importance of frequent follow-up visits to maximize his success with intensive lifestyle modifications for his multiple health conditions.   Objective:   Blood pressure 103/65, pulse 77, temperature 98.2 F (36.8 C), height 5\' 10"  (1.778 m), weight 243 lb (110.2 kg), SpO2 98 %. Body mass index is 34.87 kg/m.  General: Cooperative, alert, well developed, in no acute distress. HEENT: Conjunctivae and lids unremarkable. Cardiovascular: Regular rhythm.  Lungs: Normal work of breathing. Neurologic: No focal deficits.   Lab Results  Component Value Date   CREATININE 2.81 (H) 08/27/2021   BUN 38 (H) 08/27/2021   NA 139 08/27/2021   K 4.5 08/27/2021   CL 100 08/27/2021   CO2 22 08/27/2021   Lab Results  Component Value Date   ALT 22 08/27/2021   AST 25 08/27/2021   ALKPHOS 64 08/27/2021   BILITOT 0.2 08/27/2021   Lab Results  Component Value Date   HGBA1C 7.3 (H) 08/27/2021   HGBA1C 6.4 (H) 05/01/2021   HGBA1C 6.4 12/31/2020   HGBA1C 5.7 (H) 10/14/2020   HGBA1C 6.0 (H) 06/04/2020  Lab Results  Component Value Date   INSULIN 32.5 (H) 08/27/2021   INSULIN 42.0 (H) 08/06/2020   Lab Results  Component Value Date   TSH 1.060 08/06/2020   Lab Results  Component Value Date   CHOL 176 05/01/2021   HDL 48 05/01/2021   LDLCALC 79 05/01/2021   LDLDIRECT 67.0 12/31/2020   TRIG 302 (H) 05/01/2021   CHOLHDL 4 12/31/2020   Lab Results  Component Value Date   VD25OH 44.3 08/27/2021    VD25OH 43.2 05/01/2021   VD25OH 46.20 12/31/2020   Lab Results  Component Value Date   WBC 8.8 05/01/2021   HGB 13.7 05/01/2021   HCT 41.8 05/01/2021   MCV 84 05/01/2021   PLT 210 05/01/2021   Lab Results  Component Value Date   IRON 71 05/01/2021   TIBC 293 05/01/2021   FERRITIN 73 05/01/2021   Attestation Statements:   Reviewed by clinician on day of visit: allergies, medications, problem list, medical history, surgical history, family history, social history, and previous encounter notes.  I, Water quality scientist, CMA, am acting as Location manager for Mina Marble, NP.  I have reviewed the above documentation for accuracy and completeness, and I agree with the above. -  Lylie Blacklock d. Mery Guadalupe, NP-C

## 2021-11-11 ENCOUNTER — Encounter (INDEPENDENT_AMBULATORY_CARE_PROVIDER_SITE_OTHER): Payer: Self-pay | Admitting: Adult Health

## 2021-11-11 ENCOUNTER — Other Ambulatory Visit: Payer: Self-pay

## 2021-11-11 ENCOUNTER — Ambulatory Visit (INDEPENDENT_AMBULATORY_CARE_PROVIDER_SITE_OTHER): Payer: BC Managed Care – PPO | Admitting: Adult Health

## 2021-11-11 VITALS — BP 128/76 | HR 66 | Temp 97.9°F | Ht 70.0 in | Wt 246.0 lb

## 2021-11-11 DIAGNOSIS — Z794 Long term (current) use of insulin: Secondary | ICD-10-CM

## 2021-11-11 DIAGNOSIS — Z6835 Body mass index (BMI) 35.0-35.9, adult: Secondary | ICD-10-CM | POA: Diagnosis not present

## 2021-11-11 DIAGNOSIS — E669 Obesity, unspecified: Secondary | ICD-10-CM | POA: Diagnosis not present

## 2021-11-11 DIAGNOSIS — E1122 Type 2 diabetes mellitus with diabetic chronic kidney disease: Secondary | ICD-10-CM

## 2021-11-11 DIAGNOSIS — N184 Chronic kidney disease, stage 4 (severe): Secondary | ICD-10-CM | POA: Diagnosis not present

## 2021-11-11 DIAGNOSIS — E66812 Obesity, class 2: Secondary | ICD-10-CM

## 2021-11-12 ENCOUNTER — Encounter: Payer: Self-pay | Admitting: Internal Medicine

## 2021-11-12 NOTE — Progress Notes (Signed)
Chief Complaint:   OBESITY Frederick Fox is here to discuss his progress with his obesity treatment plan along with follow-up of his obesity related diagnoses. Frederick Fox is on the Category 4 Plan and states he is following his eating plan approximately 90% of the time. Frederick Fox states he is doing cardio/lifting weights for 30-45 minutes 5 times per week.  Today's visit was #: 20 Starting weight: 256 lbs Starting date: 08/06/2020 Today's weight: 246 lbs Today's date: 11/11/2021 Total lbs lost to date: 10 lbs Total lbs lost since last in-office visit: 0  Interim History:  Frederick Fox says that when he enjoyed Frederick Fox - he tracked for daily snack calories.  2023 Health/Wellness Goals: 1) Bariatric surgery summer 2023 - passed all screening requirements.  Subjective:   1. Type 2 diabetes mellitus with stage 4 chronic kidney disease, with long-term current use of insulin ([name] A1c on 08/27/2021 was 7.3. Ambulatory fasting BG 100-110, PP 120. He is on Rybelsus 14 mg daily, Farxiga 10 mg daily. Discussed changing from Rybelsus to Ozempic if A1c remains above 7 at next A1c check.  Assessment/Plan:   1. Type 2 diabetes mellitus with stage 4 chronic kidney disease, with long-term current use of insulin ([name] Continue current antidiabetic regimen.  2. Obesity: Current BMI 35.4  Frederick Fox is currently in the action stage of change. As such, his goal is to continue with weight loss efforts. He has agreed to the Category 4 Plan.   Exercise goals:  As is.  Behavioral modification strategies: increasing lean protein intake, decreasing simple carbohydrates, meal planning and cooking strategies, keeping healthy foods in the home, and planning for success.  Frederick Fox has agreed to follow-up with our clinic in 3 weeks. He was informed of the importance of frequent follow-up visits to maximize his success with intensive lifestyle modifications for his multiple health conditions.   Objective:    Blood pressure 128/76, pulse 66, temperature 97.9 F (36.6 C), height 5\' 10"  (1.778 m), weight 246 lb (111.6 kg), SpO2 96 %. Body mass index is 35.3 kg/m.  General: Cooperative, alert, well developed, in no acute distress. HEENT: Conjunctivae and lids unremarkable. Cardiovascular: Regular rhythm.  Lungs: Normal work of breathing. Neurologic: No focal deficits.   Lab Results  Component Value Date   CREATININE 2.81 (H) 08/27/2021   BUN 38 (H) 08/27/2021   NA 139 08/27/2021   K 4.5 08/27/2021   CL 100 08/27/2021   CO2 22 08/27/2021   Lab Results  Component Value Date   ALT 22 08/27/2021   AST 25 08/27/2021   ALKPHOS 64 08/27/2021   BILITOT 0.2 08/27/2021   Lab Results  Component Value Date   HGBA1C 7.3 (H) 08/27/2021   HGBA1C 6.4 (H) 05/01/2021   HGBA1C 6.4 12/31/2020   HGBA1C 5.7 (H) 10/14/2020   HGBA1C 6.0 (H) 06/04/2020   Lab Results  Component Value Date   INSULIN 32.5 (H) 08/27/2021   INSULIN 42.0 (H) 08/06/2020   Lab Results  Component Value Date   TSH 1.060 08/06/2020   Lab Results  Component Value Date   CHOL 176 05/01/2021   HDL 48 05/01/2021   LDLCALC 79 05/01/2021   LDLDIRECT 67.0 12/31/2020   TRIG 302 (H) 05/01/2021   CHOLHDL 4 12/31/2020   Lab Results  Component Value Date   VD25OH 44.3 08/27/2021   VD25OH 43.2 05/01/2021   VD25OH 46.20 12/31/2020   Lab Results  Component Value Date   WBC 8.8 05/01/2021   HGB 13.7 05/01/2021  HCT 41.8 05/01/2021   MCV 84 05/01/2021   PLT 210 05/01/2021   Lab Results  Component Value Date   IRON 71 05/01/2021   TIBC 293 05/01/2021   FERRITIN 73 05/01/2021   Attestation Statements:   Reviewed by clinician on day of visit: allergies, medications, problem list, medical history, surgical history, family history, social history, and previous encounter notes.  Time spent on visit including pre-visit chart review and post-visit care and charting was 27 minutes.   I, Water quality scientist, CMA, am acting as  Location manager for Mina Marble, NP.  I have reviewed the above documentation for accuracy and completeness, and I agree with the above. -  Jamire Shabazz d. Lakoda Mcanany, NP-C

## 2021-11-17 MED ORDER — OZEMPIC (0.25 OR 0.5 MG/DOSE) 2 MG/1.5ML ~~LOC~~ SOPN: PEN_INJECTOR | SUBCUTANEOUS | 0 refills | Status: DC

## 2021-11-21 ENCOUNTER — Other Ambulatory Visit: Payer: Self-pay | Admitting: Internal Medicine

## 2021-12-02 ENCOUNTER — Ambulatory Visit (INDEPENDENT_AMBULATORY_CARE_PROVIDER_SITE_OTHER): Payer: BC Managed Care – PPO | Admitting: Adult Health

## 2021-12-22 ENCOUNTER — Other Ambulatory Visit: Payer: Self-pay | Admitting: Internal Medicine

## 2021-12-24 ENCOUNTER — Ambulatory Visit (INDEPENDENT_AMBULATORY_CARE_PROVIDER_SITE_OTHER): Payer: BC Managed Care – PPO | Admitting: Adult Health

## 2022-01-14 IMAGING — DX DG CHEST 2V
3 series · 3 of 3 positions shown · non-contrast
Comparison: Chest radiograph March 29, 2008

CLINICAL DATA: Exercise induced asthma, cough x3 weeks

EXAM:
CHEST - 2 VIEW

[chest pa (1 of 2)]
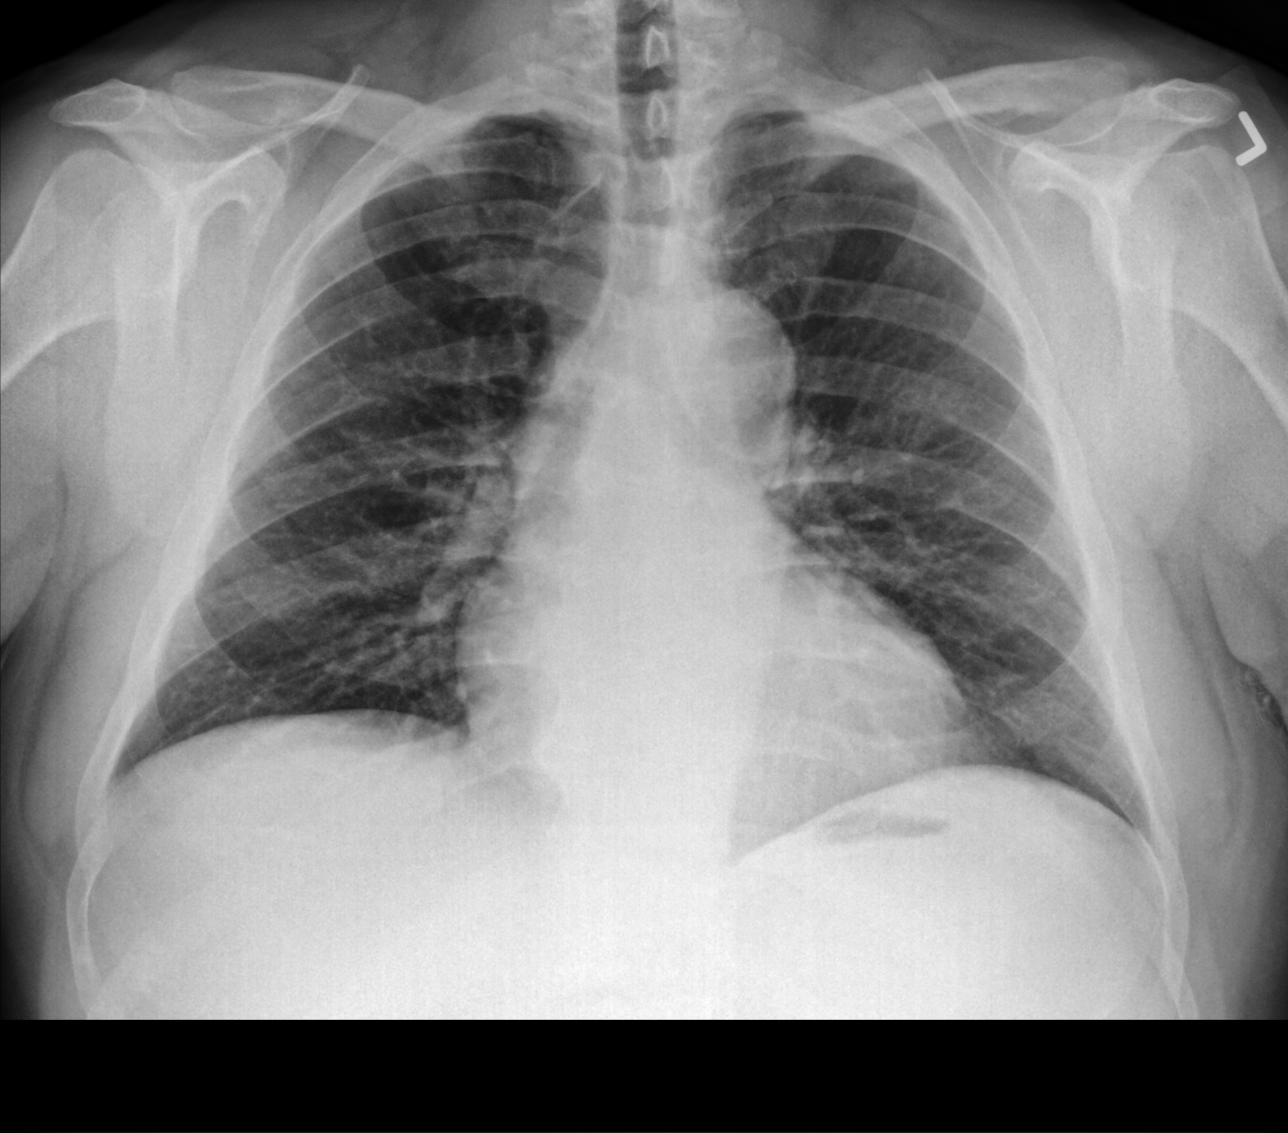

[chest pa (2 of 2)]
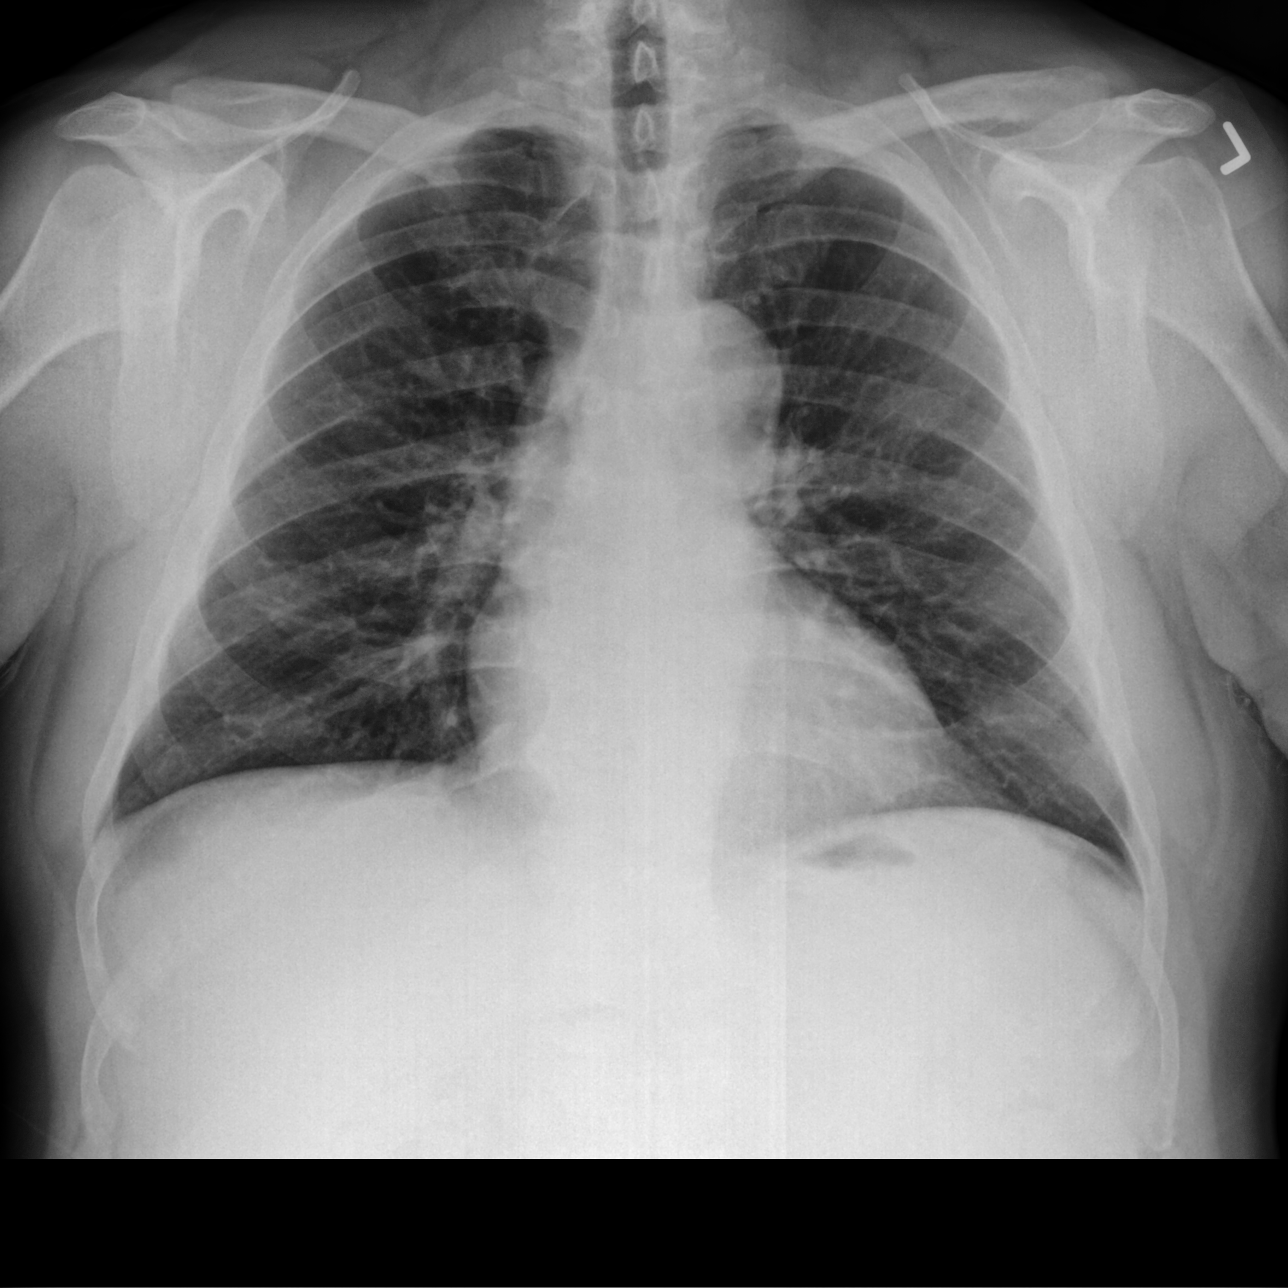

[chest lat]
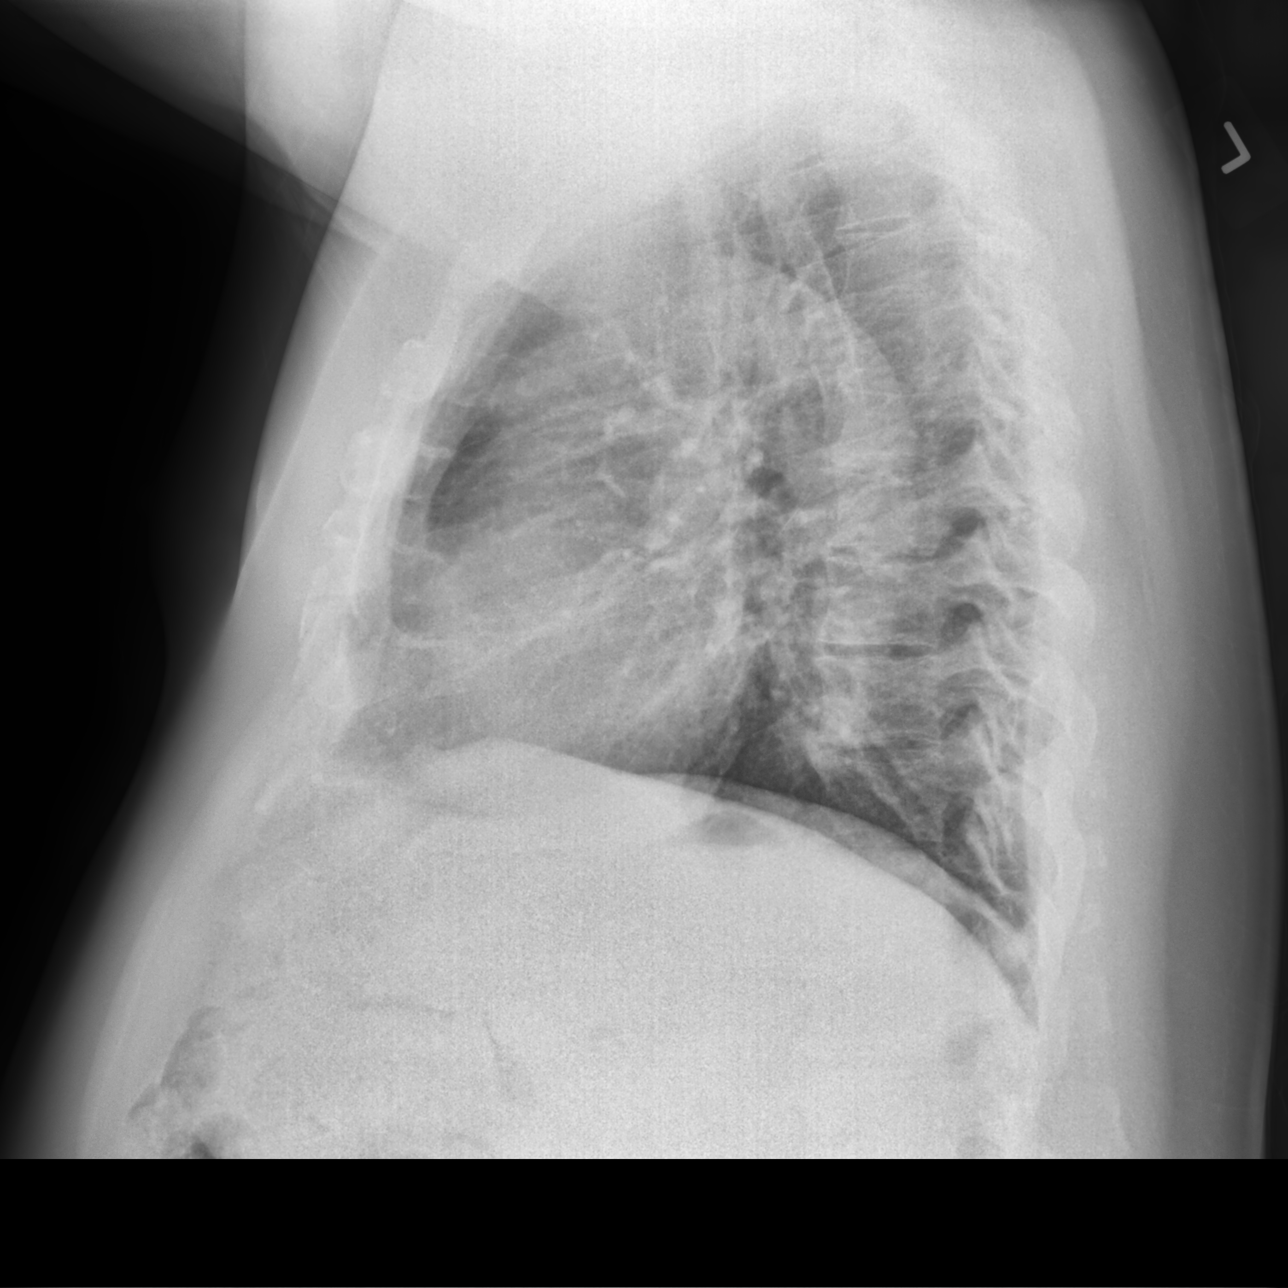

[3 of 3 positions shown; findings below may reference images not displayed]

FINDINGS: The heart size and mediastinal contours are within normal limits.
Mild diffuse interstitial thickening/bronchial wall thickening. No
focal consolidation. No pneumothorax or pleural effusion. The
visualized skeletal structures are unremarkable.
IMPRESSION: Mild diffuse interstitial thickening/bronchial wall thickening as
can be seen with reactive airways disease. No focal consolidation.

## 2022-01-16 ENCOUNTER — Ambulatory Visit: Payer: BC Managed Care – PPO | Admitting: Internal Medicine

## 2022-01-18 ENCOUNTER — Encounter: Payer: Self-pay | Admitting: Internal Medicine

## 2022-01-18 NOTE — Patient Instructions (Addendum)
? ? ? ?  Blood work was ordered.   ? ? ? ?Medications changes include :   none ? ? ? ?Return in about 6 months (around 07/21/2022) for Schedule DEXA-[facility], CPE. ? ?

## 2022-01-18 NOTE — Progress Notes (Signed)
? ? ? ? ?Subjective:  ? ? Patient ID: Frederick Fox, male    DOB: January 14, 1966, 56 y.o.   MRN: [id number] ? ?This visit occurred during the SARS-CoV-2 public health emergency.  Safety protocols were in place, including screening questions prior to the visit, additional usage of staff PPE, and extensive cleaning of exam room while observing appropriate contact time as indicated for disinfecting solutions.   ? ? ?HPI ?Josyah is here for follow up of his chronic medical problems, including htn, hld, DM, CKD, gout, depression, gerd, obesity, asthma ? ?He is on ozempic.   ? ?Does not stay asleep.  No matter what time he goes to bed he wakes up at 3.  Has some night sweats.  Uses cpap nightly.  He is a little anxious.   ? ?He meets with the surgeon this week to discuss scheduling his bypass surgery. ? ?Wonders if he needs the Lexapro or if he could try something like St. John's wort. ? ?Medications and allergies reviewed with patient and updated if appropriate. ? ?Current Outpatient Medications on File Prior to Visit  ?Medication Sig Dispense Refill  ? albuterol (PROAIR HFA) 108 (90 Base) MCG/ACT inhaler Inhale 2 puffs into the lungs every 6 (six) hours as needed for wheezing or shortness of breath. 1 each 5  ? allopurinol (ZYLOPRIM) 300 MG tablet Take 1 tablet by mouth once daily 90 tablet 0  ? amLODipine (NORVASC) 10 MG tablet Take 1 tablet by mouth once daily 90 tablet 0  ? budesonide-formoterol (SYMBICORT) 80-4.5 MCG/ACT inhaler Inhale 2 puffs into the lungs 2 (two) times daily. 1 each 5  ? Cholecalciferol (VITAMIN D3 PO) Take 5,000 Units by mouth daily.    ? clomiPHENE (CLOMID) 50 MG tablet Take 50 mg by mouth daily.    ? escitalopram (LEXAPRO) 20 MG tablet Take 1 tablet (20 mg total) by mouth daily. 90 tablet 0  ? famotidine (PEPCID) 20 MG tablet Take 2 tablets (40 mg total) by mouth daily.    ? FARXIGA 10 MG TABS tablet Take 10 mg by mouth daily.    ? losartan (COZAAR) 100 MG tablet Take 1 tablet by mouth once  daily 90 tablet 0  ? Multiple Vitamin (MULTIVITAMIN ADULT PO) Take by mouth.    ? nebivolol (BYSTOLIC) 5 MG tablet Take 1 tablet by mouth once daily 90 tablet 0  ? OZEMPIC, 0.25 OR 0.5 MG/DOSE, 2 MG/1.5ML SOPN INJECT 0.25 MG  SUBCUTANEOUSLY ONCE A WEEK FOR 30 DAYS THEN THEN 0.5 MG  ONCE A WEEK 2 mL 0  ? pravastatin (PRAVACHOL) 10 MG tablet Take 1 tablet by mouth once daily 90 tablet 0  ? Turmeric 500 MG CAPS Take by mouth daily.    ? Vitamin D, Ergocalciferol, (DRISDOL) 1.25 MG (50000 UNIT) CAPS capsule Take 1 capsule (50,000 Units total) by mouth every 7 (seven) days. 4 capsule 0  ? ?No current facility-administered medications on file prior to visit.  ? ? ? ?Review of Systems  ?Constitutional:  Negative for fever.  ?Respiratory:  Negative for cough, shortness of breath and wheezing.   ?Cardiovascular:  Negative for chest pain, palpitations and leg swelling.  ?Neurological:  Negative for light-headedness and headaches.  ? ?   ?Objective:  ? ?Vitals:  ? 01/19/22 0858  ?BP: 138/84  ?Pulse: 76  ?Temp: 98.5 ?F (36.9 ?C)  ?SpO2: 98%  ? ?BP Readings from Last 3 Encounters:  ?01/19/22 138/84  ?11/11/21 128/76  ?10/21/21 103/65  ? ?Wt Readings from  Last 3 Encounters:  ?01/19/22 247 lb 2 oz (112.1 kg)  ?11/11/21 246 lb (111.6 kg)  ?10/21/21 243 lb (110.2 kg)  ? ?Body mass index is 35.46 kg/m?. ? ?  ?Physical Exam ?Constitutional:   ?   General: He is not in acute distress. ?   Appearance: Normal appearance. He is not ill-appearing.  ?HENT:  ?   Head: Normocephalic and atraumatic.  ?Eyes:  ?   Conjunctiva/sclera: Conjunctivae normal.  ?Cardiovascular:  ?   Rate and Rhythm: Normal rate and regular rhythm.  ?   Heart sounds: Normal heart sounds. No murmur heard. ?Pulmonary:  ?   Effort: Pulmonary effort is normal. No respiratory distress.  ?   Breath sounds: Normal breath sounds. No wheezing or rales.  ?Musculoskeletal:  ?   Right lower leg: No edema.  ?   Left lower leg: No edema.  ?Skin: ?   General: Skin is warm and dry.  ?    Findings: No rash.  ?Neurological:  ?   Mental Status: He is alert. Mental status is at baseline.  ?Psychiatric:     ?   Mood and Affect: Mood normal.  ? ?   ? ?Lab Results  ?Component Value Date  ? WBC 8.8 05/01/2021  ? HGB 13.7 05/01/2021  ? HCT 41.8 05/01/2021  ? PLT 210 05/01/2021  ? GLUCOSE 106 (H) 08/27/2021  ? CHOL 176 05/01/2021  ? TRIG 302 (H) 05/01/2021  ? HDL 48 05/01/2021  ? LDLDIRECT 67.0 12/31/2020  ? Stewart 79 05/01/2021  ? ALT 22 08/27/2021  ? AST 25 08/27/2021  ? NA 139 08/27/2021  ? K 4.5 08/27/2021  ? CL 100 08/27/2021  ? CREATININE 2.81 (H) 08/27/2021  ? BUN 38 (H) 08/27/2021  ? CO2 22 08/27/2021  ? TSH 1.060 08/06/2020  ? PSA 1.15 04/24/2013  ? HGBA1C 7.3 (H) 08/27/2021  ? MICROALBUR 251.7 (H) 10/09/2008  ? ? ? ?Assessment & Plan:  ? ? ?See Problem List for Assessment and Plan of chronic medical problems.  ? ? ?

## 2022-01-19 ENCOUNTER — Ambulatory Visit: Payer: BC Managed Care – PPO | Admitting: Internal Medicine

## 2022-01-19 VITALS — BP 138/84 | HR 76 | Temp 98.5°F | Ht 70.0 in | Wt 247.1 lb

## 2022-01-19 DIAGNOSIS — J4540 Moderate persistent asthma, uncomplicated: Secondary | ICD-10-CM

## 2022-01-19 DIAGNOSIS — M1A9XX0 Chronic gout, unspecified, without tophus (tophi): Secondary | ICD-10-CM

## 2022-01-19 DIAGNOSIS — E1122 Type 2 diabetes mellitus with diabetic chronic kidney disease: Secondary | ICD-10-CM | POA: Diagnosis not present

## 2022-01-19 DIAGNOSIS — E1169 Type 2 diabetes mellitus with other specified complication: Secondary | ICD-10-CM | POA: Diagnosis not present

## 2022-01-19 DIAGNOSIS — Z794 Long term (current) use of insulin: Secondary | ICD-10-CM

## 2022-01-19 DIAGNOSIS — I10 Essential (primary) hypertension: Secondary | ICD-10-CM | POA: Diagnosis not present

## 2022-01-19 DIAGNOSIS — K219 Gastro-esophageal reflux disease without esophagitis: Secondary | ICD-10-CM

## 2022-01-19 DIAGNOSIS — N184 Chronic kidney disease, stage 4 (severe): Secondary | ICD-10-CM | POA: Diagnosis not present

## 2022-01-19 DIAGNOSIS — E785 Hyperlipidemia, unspecified: Secondary | ICD-10-CM

## 2022-01-19 DIAGNOSIS — F3289 Other specified depressive episodes: Secondary | ICD-10-CM

## 2022-01-19 LAB — COMPREHENSIVE METABOLIC PANEL
ALT: 36 U/L (ref 0–53)
AST: 32 U/L (ref 0–37)
Albumin: 3.7 g/dL (ref 3.5–5.2)
Alkaline Phosphatase: 74 U/L (ref 39–117)
BUN: 36 mg/dL — ABNORMAL HIGH (ref 6–23)
CO2: 28 mEq/L (ref 19–32)
Calcium: 9.2 mg/dL (ref 8.4–10.5)
Chloride: 101 mEq/L (ref 96–112)
Creatinine, Ser: 2.46 mg/dL — ABNORMAL HIGH (ref 0.40–1.50)
GFR: 28.96 mL/min — ABNORMAL LOW (ref >60.00)
Glucose, Bld: 207 mg/dL — ABNORMAL HIGH (ref 70–99)
Potassium: 4.3 mEq/L (ref 3.5–5.1)
Sodium: 138 mEq/L (ref 135–145)
Total Bilirubin: 0.3 mg/dL (ref 0.2–1.2)
Total Protein: 6.3 g/dL (ref 6.0–8.3)

## 2022-01-19 LAB — LDL CHOLESTEROL, DIRECT: Direct LDL: 82 mg/dL

## 2022-01-19 LAB — CBC WITH DIFFERENTIAL/PLATELET
Basophils Absolute: 0.1 10*3/uL (ref 0.0–0.1)
Basophils Relative: 0.4 % (ref 0.0–3.0)
Eosinophils Absolute: 0.3 10*3/uL (ref 0.0–0.7)
Eosinophils Relative: 3 % (ref 0.0–5.0)
HCT: 43 % (ref 39.0–52.0)
Hemoglobin: 14.3 g/dL (ref 13.0–17.0)
Lymphocytes Relative: 21 % (ref 12.0–46.0)
Lymphs Abs: 2.4 10*3/uL (ref 0.7–4.0)
MCHC: 33.2 g/dL (ref 30.0–36.0)
MCV: 82.7 fl (ref 78.0–100.0)
Monocytes Absolute: 0.8 10*3/uL (ref 0.1–1.0)
Monocytes Relative: 7 % (ref 3.0–12.0)
Neutro Abs: 7.9 10*3/uL — ABNORMAL HIGH (ref 1.4–7.7)
Neutrophils Relative %: 68.6 % (ref 43.0–77.0)
Platelets: 233 10*3/uL (ref 150.0–400.0)
RBC: 5.2 Mil/uL (ref 4.22–5.81)
RDW: 14 % (ref 11.5–15.5)
WBC: 11.6 10*3/uL — ABNORMAL HIGH (ref 4.0–10.5)

## 2022-01-19 LAB — MICROALBUMIN / CREATININE URINE RATIO
Creatinine,U: 56.8 mg/dL
Microalb Creat Ratio: 206 mg/g — ABNORMAL HIGH (ref 0.0–30.0)
Microalb, Ur: 117 mg/dL — ABNORMAL HIGH (ref 0.0–1.9)

## 2022-01-19 LAB — HEMOGLOBIN A1C: Hgb A1c MFr Bld: 8.1 % — ABNORMAL HIGH (ref 4.6–6.5)

## 2022-01-19 LAB — LIPID PANEL
Cholesterol: 197 mg/dL (ref 0–200)
HDL: 44.9 mg/dL (ref >39.00)
Total CHOL/HDL Ratio: 4
Triglycerides: 545 mg/dL — ABNORMAL HIGH (ref 0.0–149.0)

## 2022-01-19 NOTE — Assessment & Plan Note (Signed)
Chronic ?Mild, persistent ?Controlled ?Continue Symbicort inhaler twice daily and albuterol inhaler as needed ?

## 2022-01-19 NOTE — Assessment & Plan Note (Addendum)
Chronic ?Controlled, Stable ?Continue Lexapro 20 mg daily ?This may be causing some of the weight gain ?Discussed that he can try St. John's wort if he wants to, but would have to taper off the Lexapro ?I do think that when he loses weight some of his depression will improve so he may not need anything after surgery ?

## 2022-01-19 NOTE — Assessment & Plan Note (Signed)
Chronic Regular exercise and healthy diet encouraged Check lipid panel  Continue pravastatin 10 mg daily 

## 2022-01-19 NOTE — Assessment & Plan Note (Signed)
Chronic ?Following with Dr[facility] ?CMP ?

## 2022-01-19 NOTE — Assessment & Plan Note (Signed)
Chronic GERD controlled Continue Pepcid 40 mg daily  

## 2022-01-19 NOTE — Assessment & Plan Note (Addendum)
Chronic ?Lab Results  ?Component Value Date  ? HGBA1C 7.3 (H) 08/27/2021  ? ?Sugars not ideally controlled when last checked ?Check A1c, urine microalbumin today ?Continue Farxiga 10 mg daily, continue Ozempic at current dose-will not increase at this point since he anticipates having gastric sleeve in 2 months ?Stressed regular exercise, diabetic diet ?Will adjust medication as needed ? ?

## 2022-01-19 NOTE — Assessment & Plan Note (Signed)
Chronic Blood pressure well controlled CMP Continue amlodipine 10 mg daily, losartan 100 mg daily, Bystolic 5 mg daily 

## 2022-01-19 NOTE — Assessment & Plan Note (Signed)
Chronic Denies gout flares Continue allopurinol 300 mg daily 

## 2022-01-26 ENCOUNTER — Other Ambulatory Visit: Payer: Self-pay | Admitting: Internal Medicine

## 2022-02-02 ENCOUNTER — Encounter: Payer: Self-pay | Admitting: Internal Medicine

## 2022-03-10 ENCOUNTER — Ambulatory Visit: Payer: Self-pay | Admitting: General Surgery

## 2022-03-10 DIAGNOSIS — G4733 Obstructive sleep apnea (adult) (pediatric): Secondary | ICD-10-CM

## 2022-03-10 DIAGNOSIS — E1122 Type 2 diabetes mellitus with diabetic chronic kidney disease: Secondary | ICD-10-CM

## 2022-03-10 DIAGNOSIS — E66813 Obesity, class 3: Secondary | ICD-10-CM

## 2022-03-12 ENCOUNTER — Other Ambulatory Visit: Payer: Self-pay | Admitting: Internal Medicine

## 2022-03-13 ENCOUNTER — Other Ambulatory Visit: Payer: Self-pay | Admitting: Internal Medicine

## 2022-03-16 ENCOUNTER — Encounter: Payer: Self-pay | Admitting: Skilled Nursing Facility1

## 2022-03-16 ENCOUNTER — Encounter: Payer: BC Managed Care – PPO | Attending: General Surgery | Admitting: Skilled Nursing Facility1

## 2022-03-16 DIAGNOSIS — Z6837 Body mass index (BMI) 37.0-37.9, adult: Secondary | ICD-10-CM | POA: Diagnosis present

## 2022-03-16 DIAGNOSIS — N184 Chronic kidney disease, stage 4 (severe): Secondary | ICD-10-CM

## 2022-03-16 DIAGNOSIS — Z794 Long term (current) use of insulin: Secondary | ICD-10-CM | POA: Diagnosis present

## 2022-03-16 DIAGNOSIS — E1122 Type 2 diabetes mellitus with diabetic chronic kidney disease: Secondary | ICD-10-CM | POA: Diagnosis present

## 2022-03-16 DIAGNOSIS — E66812 Obesity, class 2: Secondary | ICD-10-CM

## 2022-03-16 NOTE — Progress Notes (Signed)
  Pre-Operative Nutrition Class:    Patient was seen on 03/16/2022 for Pre-Operative Bariatric Surgery Education at [facility].   Dietitian advised pt not to lose any more weight due to his 35.1 BMI: Pt states he is losing weight without even trying   Surgery date: 03/31/2022 Surgery type: RYGB Start weight at [facility]: 251.3 Weight today: 244 pounds  Samples given per MNT protocol. Patient educated on appropriate usage: Bariatric Advantage Multivitamin Lot # [id number] Exp:08/24   Bariatric Advantage Calcium  Lot #[id number] Exp: 08/17/2022   Protein Shake Lot # 01/24 Exp: [id number]  The following the learning objectives were met by the patient during this course: Identify Pre-Op Dietary Goals and will begin 2 weeks pre-operatively Identify appropriate sources of fluids and proteins  State protein recommendations and appropriate sources pre and post-operatively Identify Post-Operative Dietary Goals and will follow for 2 weeks post-operatively Identify appropriate multivitamin and calcium sources Describe the need for physical activity post-operatively and will follow MD recommendations State when to call healthcare provider regarding medication questions or post-operative complications When having a diagnosis of diabetes understanding hypoglycemia symptoms and the inclusion of 1 complex carbohydrate per meal  Handouts given during class include: Pre-Op Bariatric Surgery Diet Handout Protein Shake Handout Post-Op Bariatric Surgery Nutrition Handout BELT Program Information Flyer Support Group Information Flyer [facility] Bariatric Supplements Price List  Follow-Up Plan: Patient will follow-up at [facility] 2 weeks post operatively for diet advancement per MD.

## 2022-03-24 ENCOUNTER — Other Ambulatory Visit (HOSPITAL_COMMUNITY): Payer: Self-pay

## 2022-03-24 NOTE — Progress Notes (Addendum)
COVID Vaccine Completed: yes x3  Date of COVID positive in last 90 days: no  PCP - Billey Gosling, MD Cardiologist - Rudean Haskell, MD  Chest x-ray - 03/26/22 Epic EKG - 04/18/21 Epic Stress Test - n/a ECHO - 05/12/21 Epic Cardiac Cath - n/a Pacemaker/ICD device last checked: n/a Spinal Cord Stimulator: n/a  Bowel Prep - no solids after 6 pm night before  Sleep Study - yes, positive CPAP - yes every night  Fasting Blood Sugar - 200s Checks Blood Sugar  checks once a week  Blood Thinner Instructions:  n/a Aspirin Instructions: Last Dose:  Activity level: Can go up a flight of stairs and perform activities of daily living without stopping and without symptoms of chest pain. SOB, patient has asthma     Anesthesia review: DM2, HTN, DOE, OSA, coronary artery calcification, BS at PAT 518, recheck 472. Brought info to [place], [place]. Per recommendation patient needs to go home and check blood sugars to ensure it comes down and does not increase. If it is not improving he will need to be seen today. Also instructed patient that if his BS is greater than 250 DOS he may be cancelled. Will obtain UA, CMP, CBC w/diff. If labs are concerning he will be contacted that he needs to be seen today. Patient verbalized understanding. UA+ glucose, A1C 11.1, creatinine 2.57  Patient denies shortness of breath, fever, cough and chest pain at PAT appointment  Patient verbalized understanding of instructions that were given to them at the PAT appointment. Patient was also instructed that they will need to review over the PAT instructions again at home before surgery.

## 2022-03-24 NOTE — Patient Instructions (Addendum)
DUE TO COVID-19 ONLY TWO VISITORS  (aged 56 and older)  ARE ALLOWED TO COME WITH YOU AND STAY IN THE WAITING ROOM ONLY DURING PRE OP AND PROCEDURE.   **NO VISITORS ARE ALLOWED IN THE SHORT STAY AREA OR RECOVERY ROOM!!**  IF YOU WILL BE ADMITTED INTO THE HOSPITAL YOU ARE ALLOWED ONLY FOUR SUPPORT PEOPLE DURING VISITATION HOURS ONLY (7 AM -8PM)   The support person(s) must pass our screening, gel in and out, and wear a mask at all times, including in the patient's room. Patients must also wear a mask when staff or their support person are in the room. Visitors GUEST BADGE MUST BE WORN VISIBLY  One adult visitor may remain with you overnight and MUST be in the room by 8 P.M.     Your procedure is scheduled on: 03/31/22   Report to [facility] Main Entrance    Report to admitting at 5:15 AM   Call this number if you have problems the morning of surgery [phone]   MORNING OF SURGERY DRINK:   DRINK 1 G2 drink BEFORE YOU LEAVE HOME, DRINK ALL OF THE  G2 DRINK AT ONE TIME.   NO SOLID FOOD AFTER 600 PM THE NIGHT BEFORE YOUR SURGERY. YOU MAY DRINK CLEAR FLUIDS. THE G2 DRINK YOU DRINK BEFORE YOU LEAVE HOME WILL BE THE LAST FLUIDS YOU DRINK BEFORE SURGERY.  PAIN IS EXPECTED AFTER SURGERY AND WILL NOT BE COMPLETELY ELIMINATED. AMBULATION AND TYLENOL WILL HELP REDUCE INCISIONAL AND GAS PAIN. MOVEMENT IS KEY!  YOU ARE EXPECTED TO BE OUT OF BED WITHIN 4 HOURS OF ADMISSION TO YOUR PATIENT ROOM.  SITTING IN THE RECLINER THROUGHOUT THE DAY IS IMPORTANT FOR DRINKING FLUIDS AND MOVING GAS THROUGHOUT THE GI TRACT.  COMPRESSION STOCKINGS SHOULD BE WORN [place] UNLESS YOU ARE WALKING.   INCENTIVE SPIROMETER SHOULD BE USED EVERY HOUR WHILE AWAKE TO DECREASE POST-OPERATIVE COMPLICATIONS SUCH AS PNEUMONIA.  WHEN DISCHARGED HOME, IT IS IMPORTANT TO CONTINUE TO WALK EVERY HOUR AND USE THE INCENTIVE SPIROMETER EVERY HOUR.    After Midnight you may have the following liquids until  4:30 AM DAY OF SURGERY  Water Black Coffee (sugar ok, NO MILK/CREAM OR CREAMERS)  Tea (sugar ok, NO MILK/CREAM OR CREAMERS) regular and decaf                             Plain Jell-O (NO RED)                                           Fruit ices (not with fruit pulp, NO RED)                                     Popsicles (NO RED)                                                                  Juice: apple, WHITE grape, WHITE cranberry Sports drinks like Gatorade (NO RED) Clear broth(vegetable,chicken,beef)    The day of surgery:  Drink  ONE (1) Pre-Surgery G2 at 4:30 AM the morning of surgery. Drink in one sitting. Do not sip.  This drink was given to you during your hospital  pre-op appointment visit. Nothing else to drink after completing the  Pre-Surgery G2.          If you have questions, please contact your surgeon's office.   FOLLOW BOWEL PREP AND ANY ADDITIONAL PRE OP INSTRUCTIONS YOU RECEIVED FROM YOUR SURGEON'S OFFICE!!!     Oral Hygiene is also important to reduce your risk of infection.                                    Remember - BRUSH YOUR TEETH THE MORNING OF SURGERY WITH YOUR REGULAR TOOTHPASTE   Take these medicines the morning of surgery with A SIP OF WATER: Inhalers, Allopurinol, Amlodipine, Lexapro, Pepcid, Bystolic, Pravastatin.   DO NOT TAKE ANY ORAL DIABETIC MEDICATIONS DAY OF YOUR SURGERY  How to Manage Your Diabetes Before and After Surgery  Why is it important to control my blood sugar before and after surgery? Improving blood sugar levels before and after surgery helps healing and can limit problems. A way of improving blood sugar control is eating a healthy diet by:  Eating less sugar and carbohydrates  Increasing activity/exercise  Talking with your doctor about reaching your blood sugar goals High blood sugars (greater than 180 mg/dL) can raise your risk of infections and slow your recovery, so you will need to focus on controlling your diabetes  during the weeks before surgery. Make sure that the doctor who takes care of your diabetes knows about your planned surgery including the date and location.  How do I manage my blood sugar before surgery? Check your blood sugar at least 4 times a day, starting 2 days before surgery, to make sure that the level is not too high or low. Check your blood sugar the morning of your surgery when you wake up and every 2 hours until you get to the Short Stay unit. If your blood sugar is less than 70 mg/dL, you will need to treat for low blood sugar: Do not take insulin. Treat a low blood sugar (less than 70 mg/dL) with  cup of clear juice (cranberry or apple), 4 glucose tablets, OR glucose gel. Recheck blood sugar in 15 minutes after treatment (to make sure it is greater than 70 mg/dL). If your blood sugar is not greater than 70 mg/dL on recheck, call [phone] for further instructions. Report your blood sugar to the short stay nurse when you get to Short Stay.  If you are admitted to the hospital after surgery: Your blood sugar will be checked by the staff and you will probably be given insulin after surgery (instead of oral diabetes medicines) to make sure you have good blood sugar levels. The goal for blood sugar control after surgery is 80-180 mg/dL.   WHAT DO I DO ABOUT MY DIABETES MEDICATION?  Do not take oral diabetes medicines (pills) the morning of surgery.  THE DAY BEFORE SURGERY, do not take [place].       THE MORNING OF SURGERY, do not take [place].  Reviewed and Endorsed by [facility] Patient Education Committee, August 2015   Bring CPAP mask and tubing day of surgery.  You may not have any metal on your body including jewelry, and body piercing             Do not wear lotions, powders, cologne, or deodorant              Men may shave face and neck.   Do not bring valuables to the hospital. [place].   Bring small overnight bag day of surgery.   DO NOT [place]. PHARMACY WILL DISPENSE MEDICATIONS LISTED ON YOUR MEDICATION LIST TO YOU DURING YOUR ADMISSION [place]!    Special Instructions: Bring a copy of your healthcare power of attorney and living will documents         the day of surgery if you haven't scanned them before.              Please read over the following fact sheets you were given: IF YOU HAVE QUESTIONS ABOUT YOUR PRE-OP INSTRUCTIONS PLEASE CALL [facility] - Preparing for Surgery Before surgery, you can play an important role.  Because skin is not sterile, your skin needs to be as free of germs as possible.  You can reduce the number of germs on your skin by washing with CHG (chlorahexidine gluconate) soap before surgery.  CHG is an antiseptic cleaner which kills germs and bonds with the skin to continue killing germs even after washing. Please DO NOT use if you have an allergy to CHG or antibacterial soaps.  If your skin becomes reddened/irritated stop using the CHG and inform your nurse when you arrive at Short Stay. Do not shave (including legs and underarms) for at least 48 hours prior to the first CHG shower.  You may shave your face/neck.  Please follow these instructions carefully:  1.  Shower with CHG Soap the night before surgery and the  morning of surgery.  2.  If you choose to wash your hair, wash your hair first as usual with your normal  shampoo.  3.  After you shampoo, rinse your hair and body thoroughly to remove the shampoo.                             4.  Use CHG as you would any other liquid soap.  You can apply chg directly to the skin and wash.  Gently with a scrungie or clean washcloth.  5.  Apply the CHG Soap to your body ONLY FROM THE NECK DOWN.   Do   not use on face/ open                           Wound or open sores. Avoid contact with eyes, ears mouth and   genitals  (private parts).                       Wash face,  Genitals (private parts) with your normal soap.             6.  Wash thoroughly, paying special attention to the area where your    surgery  will be performed.  7.  Thoroughly rinse your body with warm water from the neck down.  8.  DO NOT shower/wash with your normal soap  after using and rinsing off the CHG Soap.                9.  Pat yourself dry with a clean towel.            10.  Wear clean pajamas.            11.  Place clean sheets on your bed the night of your first shower and do not  sleep with pets. Day of Surgery : Do not apply any lotions/deodorants the morning of surgery.  Please wear clean clothes to the hospital/surgery center.  FAILURE TO FOLLOW THESE INSTRUCTIONS MAY RESULT IN THE CANCELLATION OF YOUR SURGERY  PATIENT SIGNATURE_________________________________  NURSE SIGNATURE__________________________________  ________________________________________________________________________   Frederick Fox  An incentive spirometer is a tool that can help keep your lungs clear and active. This tool measures how well you are filling your lungs with each breath. Taking long deep breaths may help reverse or decrease the chance of developing breathing (pulmonary) problems (especially infection) following: A long period of time when you are unable to move or be active. BEFORE THE PROCEDURE  If the spirometer includes an indicator to show your best effort, your nurse or respiratory therapist will set it to a desired goal. If possible, sit up straight or lean slightly forward. Try not to slouch. Hold the incentive spirometer in an upright position. INSTRUCTIONS FOR USE  Sit on the edge of your bed if possible, or sit up as far as you can in bed or on a chair. Hold the incentive spirometer in an upright position. Breathe out normally. Place the mouthpiece in your mouth and seal your lips tightly around it. Breathe in slowly and  as deeply as possible, raising the piston or the ball toward the top of the column. Hold your breath for 3-5 seconds or for as long as possible. Allow the piston or ball to fall to the bottom of the column. Remove the mouthpiece from your mouth and breathe out normally. Rest for a few seconds and repeat Steps 1 through 7 at least 10 times every 1-2 hours when you are awake. Take your time and take a few normal breaths between deep breaths. The spirometer may include an indicator to show your best effort. Use the indicator as a goal to work toward during each repetition. After each set of 10 deep breaths, practice coughing to be sure your lungs are clear. If you have an incision (the cut made at the time of surgery), support your incision when coughing by placing a pillow or rolled up towels firmly against it. Once you are able to get out of bed, walk around indoors and cough well. You may stop using the incentive spirometer when instructed by your caregiver.  RISKS AND COMPLICATIONS Take your time so you do not get dizzy or light-headed. If you are in pain, you may need to take or ask for pain medication before doing incentive spirometry. It is harder to take a deep breath if you are having pain. AFTER USE Rest and breathe slowly and easily. It can be helpful to keep track of a log of your progress. Your caregiver can provide you with a simple table to help with this. If you are using the spirometer at home, follow these instructions: [place] IF:  You are having difficultly using the spirometer. You have trouble using the spirometer as often as instructed. Your pain medication is not giving enough relief while using the spirometer.  You develop fever of 100.5 F (38.1 C) or higher. SEEK IMMEDIATE MEDICAL CARE IF:  You cough up bloody sputum that had not been present before. You develop fever of 102 F (38.9 C) or greater. You develop worsening pain at or near the incision site. MAKE  SURE YOU:  Understand these instructions. Will watch your condition. Will get help right away if you are not doing well or get worse. Document Released: 02/08/2007 Document Revised: 12/21/2011 Document Reviewed: 04/11/2007 [facility] Patient Information 2014 [place], [place].   ________________________________________________________________________

## 2022-03-26 ENCOUNTER — Ambulatory Visit (HOSPITAL_COMMUNITY)
Admission: RE | Admit: 2022-03-26 | Discharge: 2022-03-26 | Disposition: A | Discharge status: Home / Self Care | Payer: BC Managed Care – PPO | Source: Ambulatory Visit | Attending: Anesthesiology | Admitting: Anesthesiology

## 2022-03-26 ENCOUNTER — Encounter (HOSPITAL_COMMUNITY)
Admission: RE | Admit: 2022-03-26 | Discharge: 2022-03-26 | Disposition: A | Discharge status: Home / Self Care | Payer: BC Managed Care – PPO | Source: Ambulatory Visit | Attending: General Surgery | Admitting: General Surgery

## 2022-03-26 ENCOUNTER — Encounter (HOSPITAL_COMMUNITY): Payer: Self-pay

## 2022-03-26 VITALS — BP 150/95 | HR 69 | Temp 98.7°F | Resp 14 | Ht 69.0 in | Wt 239.0 lb

## 2022-03-26 DIAGNOSIS — Z6841 Body Mass Index (BMI) 40.0 and over, adult: Secondary | ICD-10-CM | POA: Insufficient documentation

## 2022-03-26 DIAGNOSIS — N184 Chronic kidney disease, stage 4 (severe): Secondary | ICD-10-CM | POA: Diagnosis not present

## 2022-03-26 DIAGNOSIS — Z01818 Encounter for other preprocedural examination: Secondary | ICD-10-CM

## 2022-03-26 DIAGNOSIS — E1122 Type 2 diabetes mellitus with diabetic chronic kidney disease: Secondary | ICD-10-CM | POA: Diagnosis not present

## 2022-03-26 DIAGNOSIS — Z01812 Encounter for preprocedural laboratory examination: Secondary | ICD-10-CM | POA: Diagnosis present

## 2022-03-26 DIAGNOSIS — G4733 Obstructive sleep apnea (adult) (pediatric): Secondary | ICD-10-CM

## 2022-03-26 DIAGNOSIS — Z794 Long term (current) use of insulin: Secondary | ICD-10-CM | POA: Diagnosis not present

## 2022-03-26 LAB — COMPREHENSIVE METABOLIC PANEL
ALT: 41 U/L (ref 0–44)
AST: 35 U/L (ref 15–41)
Albumin: 3.3 g/dL — ABNORMAL LOW (ref 3.5–5.0)
Alkaline Phosphatase: 92 U/L (ref 38–126)
Anion gap: 10 (ref 5–15)
BUN: 40 mg/dL — ABNORMAL HIGH (ref 6–20)
CO2: 25 mmol/L (ref 22–32)
Calcium: 9.1 mg/dL (ref 8.9–10.3)
Chloride: 102 mmol/L (ref 98–111)
Creatinine, Ser: 2.57 mg/dL — ABNORMAL HIGH (ref 0.61–1.24)
GFR, Estimated: 29 mL/min — ABNORMAL LOW (ref >60)
Glucose, Bld: 475 mg/dL — ABNORMAL HIGH (ref 70–99)
Potassium: 4.3 mmol/L (ref 3.5–5.1)
Sodium: 137 mmol/L (ref 135–145)
Total Bilirubin: 0.6 mg/dL (ref 0.3–1.2)
Total Protein: 6.7 g/dL (ref 6.5–8.1)

## 2022-03-26 LAB — CBC WITH DIFFERENTIAL/PLATELET
Abs Immature Granulocytes: 0.05 10*3/uL (ref 0.00–0.07)
Basophils Absolute: 0.1 10*3/uL (ref 0.0–0.1)
Basophils Relative: 1 %
Eosinophils Absolute: 0.2 10*3/uL (ref 0.0–0.5)
Eosinophils Relative: 2 %
HCT: 45.4 % (ref 39.0–52.0)
Hemoglobin: 15.3 g/dL (ref 13.0–17.0)
Immature Granulocytes: 1 %
Lymphocytes Relative: 23 %
Lymphs Abs: 2.2 10*3/uL (ref 0.7–4.0)
MCH: 27.7 pg (ref 26.0–34.0)
MCHC: 33.7 g/dL (ref 30.0–36.0)
MCV: 82.1 fL (ref 80.0–100.0)
Monocytes Absolute: 0.6 10*3/uL (ref 0.1–1.0)
Monocytes Relative: 6 %
Neutro Abs: 6.6 10*3/uL (ref 1.7–7.7)
Neutrophils Relative %: 67 %
Platelets: 242 10*3/uL (ref 150–400)
RBC: 5.53 MIL/uL (ref 4.22–5.81)
RDW: 13.6 % (ref 11.5–15.5)
WBC: 9.8 10*3/uL (ref 4.0–10.5)
nRBC: 0 % (ref 0.0–0.2)

## 2022-03-26 LAB — HEMOGLOBIN A1C
Hgb A1c MFr Bld: 11.1 % — ABNORMAL HIGH (ref 4.8–5.6)
Mean Plasma Glucose: 271.87 mg/dL

## 2022-03-26 LAB — URINALYSIS, ROUTINE W REFLEX MICROSCOPIC
Bacteria, UA: NONE SEEN
Bilirubin Urine: NEGATIVE
Glucose, UA: >=500 mg/dL — AB
Hgb urine dipstick: NEGATIVE
Ketones, ur: NEGATIVE mg/dL
Leukocytes,Ua: NEGATIVE
Nitrite: NEGATIVE
Protein, ur: 100 mg/dL — AB
Specific Gravity, Urine: 1.017 (ref 1.005–1.030)
pH: 6 (ref 5.0–8.0)

## 2022-03-26 LAB — GLUCOSE, CAPILLARY
Glucose-Capillary: 472 mg/dL — ABNORMAL HIGH (ref 70–99)
Glucose-Capillary: 518 mg/dL (ref 70–99)

## 2022-03-26 NOTE — Progress Notes (Signed)
UA+ glucose, A1C 11.1, creatinine 2.57, blood glucose 475, results routed to Dr. Kieth Brightly.

## 2022-03-28 ENCOUNTER — Telehealth: Payer: BC Managed Care – PPO | Admitting: Nurse Practitioner

## 2022-03-28 DIAGNOSIS — A084 Viral intestinal infection, unspecified: Secondary | ICD-10-CM | POA: Diagnosis not present

## 2022-03-28 MED ORDER — ONDANSETRON 4 MG PO TBDP: 4.0000 mg | ORAL_TABLET | Freq: Three times a day (TID) | ORAL | 0 refills | Status: DC | PRN

## 2022-03-28 NOTE — Patient Instructions (Signed)
Frederick Fox, thank you for joining Gildardo Pounds, NP for today's virtual visit.  While this provider is not your primary care provider (PCP), if your PCP is located in our provider database this encounter information will be shared with them immediately following your visit.  Consent: (Patient) Frederick Fox provided verbal consent for this virtual visit at the beginning of the encounter.  Current Medications:  Current Outpatient Medications:    ondansetron (ZOFRAN-ODT) 4 MG disintegrating tablet, Take 1 tablet (4 mg total) by mouth every 8 (eight) hours as needed for nausea or vomiting., Disp: 21 tablet, Rfl: 0   albuterol (PROAIR HFA) 108 (90 Base) MCG/ACT inhaler, Inhale 2 puffs into the lungs every 6 (six) hours as needed for wheezing or shortness of breath., Disp: 1 each, Rfl: 5   allopurinol (ZYLOPRIM) 300 MG tablet, Take 1 tablet by mouth once daily, Disp: 90 tablet, Rfl: 0   amLODipine (NORVASC) 10 MG tablet, Take 1 tablet by mouth once daily, Disp: 90 tablet, Rfl: 1   budesonide-formoterol (SYMBICORT) 80-4.5 MCG/ACT inhaler, Inhale 2 puffs into the lungs 2 (two) times daily., Disp: 1 each, Rfl: 5   cetirizine (KLS ALLER-TEC) 10 MG tablet, Take 10 mg by mouth at bedtime., Disp: , Rfl:    clomiPHENE (CLOMID) 50 MG tablet, Take 50 mg by mouth daily., Disp: , Rfl:    escitalopram (LEXAPRO) 20 MG tablet, Take 1 tablet by mouth once daily, Disp: 90 tablet, Rfl: 0   famotidine (PEPCID) 20 MG tablet, Take 2 tablets (40 mg total) by mouth daily., Disp:  , Rfl:    FARXIGA 10 MG TABS tablet, Take 10 mg by mouth daily., Disp: , Rfl:    losartan (COZAAR) 100 MG tablet, Take 1 tablet by mouth once daily, Disp: 90 tablet, Rfl: 1   Multiple Vitamin (MULTIVITAMIN ADULT PO), Take 1 tablet by mouth daily., Disp: , Rfl:    nebivolol (BYSTOLIC) 5 MG tablet, Take 1 tablet by mouth once daily, Disp: 90 tablet, Rfl: 0   OZEMPIC, 0.25 OR 0.5 MG/DOSE, 2 MG/1.5ML SOPN, INJECT 0.25 MG  SUBCUTANEOUSLY  ONCE A WEEK FOR 30 DAYS THEN THEN 0.5 MG  ONCE A WEEK (Patient not taking: Reported on 03/23/2022), Disp: 2 mL, Rfl: 0   pravastatin (PRAVACHOL) 10 MG tablet, Take 1 tablet by mouth once daily, Disp: 90 tablet, Rfl: 1   Turmeric 500 MG CAPS, Take 500 mg by mouth daily., Disp: , Rfl:    Vitamin D, Ergocalciferol, (DRISDOL) 1.25 MG (50000 UNIT) CAPS capsule, Take 1 capsule (50,000 Units total) by mouth every 7 (seven) days. (Patient not taking: Reported on 03/23/2022), Disp: 4 capsule, Rfl: 0   Medications ordered in this encounter:  Meds ordered this encounter  Medications   ondansetron (ZOFRAN-ODT) 4 MG disintegrating tablet    Sig: Take 1 tablet (4 mg total) by mouth every 8 (eight) hours as needed for nausea or vomiting.    Dispense:  21 tablet    Refill:  0    Order Specific Question:   Supervising Provider    Answer:   Sabra Heck, BRIAN [[id number]]     *If you need refills on other medications prior to your next appointment, please contact your pharmacy*  Follow-Up: Call back or seek an in-person evaluation if the symptoms worsen or if the condition fails to improve as anticipated.  Other Instructions Advance diet slowly from liquid to bland/soft then regular   If you have been instructed to have an in-person evaluation today  at a local Urgent Care facility, please use the link below. It will take you to a list of all of our available [facility], including address, phone number and hours of operation. Please do not delay care.  [facility]  If you or a family member do not have a primary care provider, use the link below to schedule a visit and establish care. When you choose a [place] [facility] primary care physician or advanced practice provider, you gain a long-term partner in health. Find a Primary Care Provider  Learn more about 's in-office and virtual care options: [place] [facility]

## 2022-03-28 NOTE — Progress Notes (Signed)
Virtual Visit Consent   Frederick Fox, you are scheduled for a virtual visit with a [facility] provider today. Just as with appointments in the office, your consent must be obtained to participate. Your consent will be active for this visit and any virtual visit you may have with one of our providers in the next 365 days. If you have a MyChart account, a copy of this consent can be sent to you electronically.  As this is a virtual visit, video technology does not allow for your provider to perform a traditional examination. This may limit your provider's ability to fully assess your condition. If your provider identifies any concerns that need to be evaluated in person or the need to arrange testing (such as labs, EKG, etc.), we will make arrangements to do so. Although advances in technology are sophisticated, we cannot ensure that it will always work on either your end or our end. If the connection with a video visit is poor, the visit may have to be switched to a telephone visit. With either a video or telephone visit, we are not always able to ensure that we have a secure connection.  By engaging in this virtual visit, you consent to the provision of healthcare and authorize for your insurance to be billed (if applicable) for the services provided during this visit. Depending on your insurance coverage, you may receive a charge related to this service.  I need to obtain your verbal consent now. Are you willing to proceed with your visit today? Frederick Fox has provided verbal consent on 03/28/2022 for a virtual visit (video or telephone). Gildardo Pounds, NP  Date: 03/28/2022 2:22 PM  Virtual Visit via Video Note   I, Gildardo Pounds, connected with  Frederick Fox  ([id number], [birth year]) on 03/28/22 at  2:30 PM EDT by a video-enabled telemedicine application and verified that I am speaking with the correct person using two identifiers.  Location: Patient: Virtual Visit Location  Patient: Home Provider: Virtual Visit Location Provider: Home Office   I discussed the limitations of evaluation and management by telemedicine and the availability of in person appointments. The patient expressed understanding and agreed to proceed.    History of Present Illness: Frederick Fox is a 56 y.o. who identifies as a male who was assigned male at birth, and is being seen today for viral gastroenteritis.  Notes 24hr onset of dry heaving and vomiting. Had sushi yesterday from the [employer] and believes this could be the culprit. Unable to keep food or liquids such as water and gatorade down. Denies fever, melena, hematemesis, abdominal pain or diarrhea.    Problems:  Patient Active Problem List   Diagnosis Date Noted   Atypical chest pain 05-08-21   Family history of sudden cardiac death May 08, 2021   Coronary artery calcification 05-08-21   Unexplained night sweats 01/13/2021   At risk for hypoglycemia 09/16/2020   Hypertension associated with type 2 diabetes mellitus ([place]) 08/21/2020   Hyperlipidemia associated with type 2 diabetes mellitus ([place]) 08/21/2020   At risk for heart disease 08/21/2020   SOB (shortness of breath) on exertion 08/06/2020   OSA on CPAP 08/06/2020   At risk for impaired metabolic function 71/24/5809   Class 2 severe obesity with serious comorbidity and body mass index (BMI) of 37.0 to 37.9 in adult [facility]) 04/05/2020   Rash 01/30/2020   Finger pain, right 12/06/2019   Joint pain 12/23/2018   Decreased libido 10/24/2018   Vitamin  D deficiency 10/23/2018   GERD (gastroesophageal reflux disease) 04/22/2018   Cervical radiculopathy at C5, chronic 08/19/2017   Ulnar neuropathy of left upper extremity, mild 08/19/2017   Bilateral arm weakness 05/18/2017   Tingling 05/18/2017   Type 2 diabetes mellitus with stage 4 chronic kidney disease, with long-term current use of insulin ([place]) 10/12/2016   Neck pain, chronic 05/13/2016   Asthma 03/24/2016    Hiatal hernia 03/24/2016   CKD (chronic kidney disease) stage 4, GFR 15-29 ml/min ([place]) 01/31/2015   High-renin essential hypertension 08/27/2013   Depression 08/22/2013   MICROSCOPIC HEMATURIA 10/23/2010   GOUT 01/10/2009   Essential hypertension 03/29/2008    Allergies:  Allergies  Allergen Reactions   Lisinopril     cough   Crestor [Rosuvastatin Calcium]     Fingers joint pain   Wellbutrin [Bupropion]     nightmares   Medications:  Current Outpatient Medications:    ondansetron (ZOFRAN-ODT) 4 MG disintegrating tablet, Take 1 tablet (4 mg total) by mouth every 8 (eight) hours as needed for nausea or vomiting., Disp: 21 tablet, Rfl: 0   albuterol (PROAIR HFA) 108 (90 Base) MCG/ACT inhaler, Inhale 2 puffs into the lungs every 6 (six) hours as needed for wheezing or shortness of breath., Disp: 1 each, Rfl: 5   allopurinol (ZYLOPRIM) 300 MG tablet, Take 1 tablet by mouth once daily, Disp: 90 tablet, Rfl: 0   amLODipine (NORVASC) 10 MG tablet, Take 1 tablet by mouth once daily, Disp: 90 tablet, Rfl: 1   budesonide-formoterol (SYMBICORT) 80-4.5 MCG/ACT inhaler, Inhale 2 puffs into the lungs 2 (two) times daily., Disp: 1 each, Rfl: 5   cetirizine (KLS ALLER-TEC) 10 MG tablet, Take 10 mg by mouth at bedtime., Disp: , Rfl:    clomiPHENE (CLOMID) 50 MG tablet, Take 50 mg by mouth daily., Disp: , Rfl:    escitalopram (LEXAPRO) 20 MG tablet, Take 1 tablet by mouth once daily, Disp: 90 tablet, Rfl: 0   famotidine (PEPCID) 20 MG tablet, Take 2 tablets (40 mg total) by mouth daily., Disp:  , Rfl:    FARXIGA 10 MG TABS tablet, Take 10 mg by mouth daily., Disp: , Rfl:    losartan (COZAAR) 100 MG tablet, Take 1 tablet by mouth once daily, Disp: 90 tablet, Rfl: 1   Multiple Vitamin (MULTIVITAMIN ADULT PO), Take 1 tablet by mouth daily., Disp: , Rfl:    nebivolol (BYSTOLIC) 5 MG tablet, Take 1 tablet by mouth once daily, Disp: 90 tablet, Rfl: 0   OZEMPIC, 0.25 OR 0.5 MG/DOSE, 2 MG/1.5ML SOPN,  INJECT 0.25 MG  SUBCUTANEOUSLY ONCE A WEEK FOR 30 DAYS THEN THEN 0.5 MG  ONCE A WEEK (Patient not taking: Reported on 03/23/2022), Disp: 2 mL, Rfl: 0   pravastatin (PRAVACHOL) 10 MG tablet, Take 1 tablet by mouth once daily, Disp: 90 tablet, Rfl: 1   Turmeric 500 MG CAPS, Take 500 mg by mouth daily., Disp: , Rfl:    Vitamin D, Ergocalciferol, (DRISDOL) 1.25 MG (50000 UNIT) CAPS capsule, Take 1 capsule (50,000 Units total) by mouth every 7 (seven) days. (Patient not taking: Reported on 03/23/2022), Disp: 4 capsule, Rfl: 0  Observations/Objective: Patient is well-developed, well-nourished in no acute distress.  Resting comfortably at home.  Head is normocephalic, atraumatic.  No labored breathing.  Speech is clear and coherent with logical content.  Patient is alert and oriented at baseline.    Assessment and Plan: 1. Viral gastroenteritis - ondansetron (ZOFRAN-ODT) 4 MG disintegrating tablet; Take 1 tablet (  4 mg total) by mouth every 8 (eight) hours as needed for nausea or vomiting.  Dispense: 21 tablet; Refill: 0 Liquid to bland soft then advance to regular diet if able.    Follow Up Instructions: I discussed the assessment and treatment plan with the patient. The patient was provided an opportunity to ask questions and all were answered. The patient agreed with the plan and demonstrated an understanding of the instructions.  A copy of instructions were sent to the patient via MyChart unless otherwise noted below.    The patient was advised to call back or seek an in-person evaluation if the symptoms worsen or if the condition fails to improve as anticipated.  Time:  I spent 11 minutes with the patient via telehealth technology discussing the above problems/concerns.    Gildardo Pounds, NP

## 2022-03-30 NOTE — Anesthesia Preprocedure Evaluation (Addendum)
Anesthesia Evaluation  Patient identified by MRN, date of birth, ID band Patient awake    Reviewed: Allergy & Precautions, NPO status , Patient's Chart, lab work & pertinent test results, reviewed documented beta blocker date and time   Airway Mallampati: III  TM Distance: >3 FB Neck ROM: Full    Dental  (+) Teeth Intact, Dental Advisory Given, Caps,    Pulmonary asthma , sleep apnea and Continuous Positive Airway Pressure Ventilation , former smoker,    Pulmonary exam normal breath sounds clear to auscultation       Cardiovascular hypertension, Pt. on home beta blockers and Pt. on medications + CAD  Normal cardiovascular exam Rhythm:Regular Rate:Normal     Neuro/Psych PSYCHIATRIC DISORDERS Depression  Neuromuscular disease    GI/Hepatic Neg liver ROS, hiatal hernia, GERD  ,  Endo/Other  diabetes, Type 2, Oral Hypoglycemic AgentsObesity   Renal/GU Renal InsufficiencyRenal disease     Musculoskeletal negative musculoskeletal ROS (+)   Abdominal   Peds  Hematology negative hematology ROS (+)   Anesthesia Other Findings   Reproductive/Obstetrics                           Anesthesia Physical Anesthesia Plan  ASA: 3  Anesthesia Plan: General   Post-op Pain Management: Tylenol PO (pre-op)*   Induction: Intravenous  PONV Risk Score and Plan: 3 and Midazolam, Dexamethasone and Ondansetron  Airway Management Planned: Oral ETT and Video Laryngoscope Planned  Additional Equipment:   Intra-op Plan:   Post-operative Plan: Extubation in OR  Informed Consent: I have reviewed the patients History and Physical, chart, labs and discussed the procedure including the risks, benefits and alternatives for the proposed anesthesia with the patient or authorized representative who has indicated his/her understanding and acceptance.     Dental advisory given  Plan Discussed with: CRNA  Anesthesia  Plan Comments: (See PAT note 03/26/2022)      Anesthesia Quick Evaluation

## 2022-03-30 NOTE — Progress Notes (Signed)
Anesthesia Chart Review   Case: [id number] Date/Time: 03/31/22 0715   Procedure: LAPAROSCOPIC ROUX-EN-Y GASTRIC BYPASS WITH UPPER ENDOSCOPY   Anesthesia type: General   Pre-op diagnosis: morbid obesity   Location: [facility] [facility] / [facility]   Surgeons: Kinsinger, Arta Bruce, MD       DISCUSSION:56 y.o. former smoker with h/o HTN, GERD, sleep apnea, CKD Stage IV, poorly controlled DM II, morbid obesity scheduled for above procedure 03/31/2022 with Dr. Gurney Maxin.   Pt with elevated blood glucose at PAT visit, 475.  Pt asymptomatic.  He reports he had a meal right before coming.  He also reports he has been drinking Dr. Malachi Bonds slushies.  He can check his blood glucose at home.  Discussed importance of close glucose monitoring and follow up with PCP.  A1C 11.1.  Risk of cancellation DOS discussed.  Ideally, this patient needs follow up with primary care for better optimization of DM.  Discussed with triage nurse at [place].   Addendum 03/30/2022:  Pt had a virtual visit on 03/28/22 with nausea and vomiting.  Discussed with Dr. Kieth Brightly. If patient symptomatic will cancel.  I have tried to get in touch with the patient, VM left. Discussed with triage nurse at [facility] who will also attempt to contact patient.   Spoke with the patient who reports n/v have resolved, no sx the last two days.  Reports blood sugars in the 200s when checking at home. Discussed with Dr. Kieth Brightly.  Evaluate DOS.  VS: BP (!) 150/95   Pulse 69   Temp 37.1 C (Oral)   Resp 14   Ht '5\' 9"'$  (1.753 m)   Wt 108.4 kg   SpO2 94%   BMI 35.29 kg/m   PROVIDERS: Binnie Rail, MD is PCP    LABS:  forwarded to PCP  (all labs ordered are listed, but only abnormal results are displayed)  Labs Reviewed  COMPREHENSIVE METABOLIC PANEL - Abnormal; Notable for the following components:      Result Value   Glucose, Bld 475 (*)    BUN 40 (*)    Creatinine, Ser 2.57 (*)    Albumin 3.3 (*)    GFR, Estimated 29 (*)    All other components  within normal limits  HEMOGLOBIN A1C - Abnormal; Notable for the following components:   Hgb A1c MFr Bld 11.1 (*)    All other components within normal limits  GLUCOSE, CAPILLARY - Abnormal; Notable for the following components:   Glucose-Capillary 518 (*)    All other components within normal limits  GLUCOSE, CAPILLARY - Abnormal; Notable for the following components:   Glucose-Capillary 472 (*)    All other components within normal limits  URINALYSIS, ROUTINE W REFLEX MICROSCOPIC - Abnormal; Notable for the following components:   Color, Urine COLORLESS (*)    Glucose, UA >=500 (*)    Protein, ur 100 (*)    All other components within normal limits  CBC WITH DIFFERENTIAL/PLATELET  TYPE AND SCREEN     IMAGES:   EKG:   CV: Echo 05/12/2021 1. Left ventricular ejection fraction, by estimation, is 55 to 60%. Left  ventricular ejection fraction by 3D volume is 54 %. The left ventricle has  normal function. The left ventricle has no regional wall motion  abnormalities. There is mild concentric  left ventricular hypertrophy. Left ventricular diastolic parameters were  normal.   2. Right ventricular systolic function is normal. The right ventricular  size is normal.   3. Left atrial  size was mildly dilated.   4. The mitral valve is normal in structure. No evidence of mitral valve  regurgitation. No evidence of mitral stenosis.   5. The aortic valve is normal in structure. Aortic valve regurgitation is  not visualized. No aortic stenosis is present.   6. The inferior vena cava is normal in size with greater than 50%  respiratory variability, suggesting right atrial pressure of 3 mmHg.  Past Medical History:  Diagnosis Date   Asthma    Chronic kidney disease    stage 4   Constipation    Depression    Diabetes mellitus without complication ([name]    GERD (gastroesophageal reflux disease)    Gout    hx of gout   H/O hiatal hernia    Hyperlipidemia    Hypertension     Prediabetes    Proteinuria 2005   Sleep apnea    uses c-pap   SOB (shortness of breath) on exertion    Vitamin D deficiency     Past Surgical History:  Procedure Laterality Date   COLONOSCOPY  05/2018   Dr.Nandigam   POLYPECTOMY     RENAL BIOPSY  2005   protien in urine, no pathology- nephrologist in [place]:  albuterol (PROAIR HFA) 108 (90 Base) MCG/ACT inhaler   allopurinol (ZYLOPRIM) 300 MG tablet   amLODipine (NORVASC) 10 MG tablet   budesonide-formoterol (SYMBICORT) 80-4.5 MCG/ACT inhaler   cetirizine (KLS ALLER-TEC) 10 MG tablet   clomiPHENE (CLOMID) 50 MG tablet   escitalopram (LEXAPRO) 20 MG tablet   famotidine (PEPCID) 20 MG tablet   FARXIGA 10 MG TABS tablet   losartan (COZAAR) 100 MG tablet   Multiple Vitamin (MULTIVITAMIN ADULT PO)   nebivolol (BYSTOLIC) 5 MG tablet   ondansetron (ZOFRAN-ODT) 4 MG disintegrating tablet   OZEMPIC, 0.25 OR 0.5 MG/DOSE, 2 MG/1.5ML SOPN   pravastatin (PRAVACHOL) 10 MG tablet   Turmeric 500 MG CAPS   Vitamin D, Ergocalciferol, (DRISDOL) 1.25 MG (50000 UNIT) CAPS capsule   No current facility-administered medications for this encounter.     Konrad Felix Ward, PA-C [facility] Pre-Surgical Testing [phone]

## 2022-03-31 ENCOUNTER — Inpatient Hospital Stay (HOSPITAL_COMMUNITY): Payer: BC Managed Care – PPO | Admitting: Certified Registered"

## 2022-03-31 ENCOUNTER — Encounter: Payer: Self-pay | Admitting: Internal Medicine

## 2022-03-31 ENCOUNTER — Other Ambulatory Visit: Payer: Self-pay

## 2022-03-31 ENCOUNTER — Encounter (HOSPITAL_COMMUNITY): Payer: Self-pay | Admitting: General Surgery

## 2022-03-31 ENCOUNTER — Encounter (HOSPITAL_COMMUNITY)
Admission: RE | Disposition: A | Discharge status: Home / Self Care | Payer: Self-pay | Source: Home / Self Care | Attending: General Surgery

## 2022-03-31 ENCOUNTER — Inpatient Hospital Stay (HOSPITAL_COMMUNITY)
Admission: RE | Admit: 2022-03-31 | Discharge: 2022-04-01 | DRG: 621 | Disposition: A | Discharge status: Home / Self Care | Payer: BC Managed Care – PPO | Attending: General Surgery | Admitting: General Surgery

## 2022-03-31 ENCOUNTER — Inpatient Hospital Stay (HOSPITAL_COMMUNITY): Payer: BC Managed Care – PPO | Admitting: Physician Assistant

## 2022-03-31 DIAGNOSIS — K219 Gastro-esophageal reflux disease without esophagitis: Secondary | ICD-10-CM | POA: Diagnosis present

## 2022-03-31 DIAGNOSIS — Z87891 Personal history of nicotine dependence: Secondary | ICD-10-CM

## 2022-03-31 DIAGNOSIS — I129 Hypertensive chronic kidney disease with stage 1 through stage 4 chronic kidney disease, or unspecified chronic kidney disease: Secondary | ICD-10-CM | POA: Diagnosis present

## 2022-03-31 DIAGNOSIS — N1832 Chronic kidney disease, stage 3b: Secondary | ICD-10-CM | POA: Diagnosis present

## 2022-03-31 DIAGNOSIS — F32A Depression, unspecified: Secondary | ICD-10-CM | POA: Diagnosis present

## 2022-03-31 DIAGNOSIS — J45909 Unspecified asthma, uncomplicated: Secondary | ICD-10-CM | POA: Diagnosis present

## 2022-03-31 DIAGNOSIS — Z6841 Body Mass Index (BMI) 40.0 and over, adult: Secondary | ICD-10-CM

## 2022-03-31 DIAGNOSIS — E1122 Type 2 diabetes mellitus with diabetic chronic kidney disease: Secondary | ICD-10-CM | POA: Diagnosis present

## 2022-03-31 DIAGNOSIS — E669 Obesity, unspecified: Secondary | ICD-10-CM | POA: Diagnosis present

## 2022-03-31 DIAGNOSIS — Z888 Allergy status to other drugs, medicaments and biological substances status: Secondary | ICD-10-CM | POA: Diagnosis not present

## 2022-03-31 DIAGNOSIS — Z79899 Other long term (current) drug therapy: Secondary | ICD-10-CM

## 2022-03-31 DIAGNOSIS — G4733 Obstructive sleep apnea (adult) (pediatric): Secondary | ICD-10-CM | POA: Diagnosis present

## 2022-03-31 DIAGNOSIS — E66812 Obesity, class 2: Secondary | ICD-10-CM | POA: Diagnosis present

## 2022-03-31 DIAGNOSIS — N184 Chronic kidney disease, stage 4 (severe): Principal | ICD-10-CM

## 2022-03-31 DIAGNOSIS — Z7951 Long term (current) use of inhaled steroids: Secondary | ICD-10-CM

## 2022-03-31 DIAGNOSIS — F419 Anxiety disorder, unspecified: Secondary | ICD-10-CM | POA: Diagnosis present

## 2022-03-31 DIAGNOSIS — Z6837 Body mass index (BMI) 37.0-37.9, adult: Secondary | ICD-10-CM

## 2022-03-31 DIAGNOSIS — I2510 Atherosclerotic heart disease of native coronary artery without angina pectoris: Secondary | ICD-10-CM | POA: Diagnosis present

## 2022-03-31 DIAGNOSIS — E785 Hyperlipidemia, unspecified: Secondary | ICD-10-CM | POA: Diagnosis present

## 2022-03-31 DIAGNOSIS — Z9884 Bariatric surgery status: Secondary | ICD-10-CM | POA: Insufficient documentation

## 2022-03-31 DIAGNOSIS — Z7984 Long term (current) use of oral hypoglycemic drugs: Secondary | ICD-10-CM | POA: Diagnosis not present

## 2022-03-31 HISTORY — PX: GASTRIC ROUX-EN-Y: SHX5262

## 2022-03-31 LAB — CBC
HCT: 43.4 % (ref 39.0–52.0)
Hemoglobin: 13.7 g/dL (ref 13.0–17.0)
MCH: 27.5 pg (ref 26.0–34.0)
MCHC: 31.6 g/dL (ref 30.0–36.0)
MCV: 87.1 fL (ref 80.0–100.0)
Platelets: 162 10*3/uL (ref 150–400)
RBC: 4.98 MIL/uL (ref 4.22–5.81)
RDW: 13.7 % (ref 11.5–15.5)
WBC: 8.2 10*3/uL (ref 4.0–10.5)
nRBC: 0 % (ref 0.0–0.2)

## 2022-03-31 LAB — HEMOGLOBIN AND HEMATOCRIT, BLOOD
HCT: 44.4 % (ref 39.0–52.0)
Hemoglobin: 14.1 g/dL (ref 13.0–17.0)

## 2022-03-31 LAB — GLUCOSE, CAPILLARY
Glucose-Capillary: 242 mg/dL — ABNORMAL HIGH (ref 70–99)
Glucose-Capillary: 233 mg/dL — ABNORMAL HIGH (ref 70–99)
Glucose-Capillary: 200 mg/dL — ABNORMAL HIGH (ref 70–99)
Glucose-Capillary: 231 mg/dL — ABNORMAL HIGH (ref 70–99)
Glucose-Capillary: 268 mg/dL — ABNORMAL HIGH (ref 70–99)
Glucose-Capillary: 270 mg/dL — ABNORMAL HIGH (ref 70–99)

## 2022-03-31 LAB — ABO/RH: ABO/RH(D): B POS

## 2022-03-31 LAB — TYPE AND SCREEN
ABO/RH(D): B POS
Antibody Screen: NEGATIVE

## 2022-03-31 LAB — CREATININE, SERUM
Creatinine, Ser: 2.77 mg/dL — ABNORMAL HIGH (ref 0.61–1.24)
GFR, Estimated: 26 mL/min — ABNORMAL LOW (ref >60)

## 2022-03-31 SURGERY — LAPAROSCOPIC ROUX-EN-Y GASTRIC BYPASS WITH UPPER ENDOSCOPY
Anesthesia: General

## 2022-03-31 MED ORDER — NEBIVOLOL HCL 5 MG PO TABS
5.0000 mg | ORAL_TABLET | Freq: Every day | ORAL | Status: DC
  Administered 2022-04-01: 5 mg via ORAL
  Filled 2022-03-31: qty 1

## 2022-03-31 MED ORDER — FENTANYL CITRATE PF 50 MCG/ML IJ SOSY
25.0000 ug | PREFILLED_SYRINGE | INTRAMUSCULAR | Status: DC | PRN
  Administered 2022-03-31 (×2): 50 ug via INTRAVENOUS

## 2022-03-31 MED ORDER — ALBUTEROL SULFATE (2.5 MG/3ML) 0.083% IN NEBU: 2.5000 mg | INHALATION_SOLUTION | Freq: Four times a day (QID) | RESPIRATORY_TRACT | Status: DC | PRN

## 2022-03-31 MED ORDER — PHENYLEPHRINE HCL (PRESSORS) 10 MG/ML IV SOLN
INTRAVENOUS | Status: AC
Start: 2022-03-31 — End: ?
  Filled 2022-03-31: qty 1

## 2022-03-31 MED ORDER — BUPIVACAINE LIPOSOME 1.3 % IJ SUSP
INTRAMUSCULAR | Status: AC
  Filled 2022-03-31: qty 20

## 2022-03-31 MED ORDER — ENSURE MAX PROTEIN PO LIQD
2.0000 [oz_av] | ORAL | Status: DC
  Administered 2022-04-01 (×4): 2 [oz_av] via ORAL

## 2022-03-31 MED ORDER — 0.9 % SODIUM CHLORIDE (POUR BTL) OPTIME
TOPICAL | Status: DC | PRN
  Administered 2022-03-31: 1000 mL

## 2022-03-31 MED ORDER — DEXAMETHASONE SODIUM PHOSPHATE 10 MG/ML IJ SOLN
INTRAMUSCULAR | Status: DC | PRN
  Administered 2022-03-31: 4 mg via INTRAVENOUS

## 2022-03-31 MED ORDER — PROPOFOL 10 MG/ML IV BOLUS
INTRAVENOUS | Status: DC | PRN
  Administered 2022-03-31: 180 mg via INTRAVENOUS

## 2022-03-31 MED ORDER — PHENYLEPHRINE HCL-NACL 20-0.9 MG/250ML-% IV SOLN
INTRAVENOUS | Status: DC | PRN
  Administered 2022-03-31: 20 ug/min via INTRAVENOUS

## 2022-03-31 MED ORDER — LABETALOL HCL 5 MG/ML IV SOLN
INTRAVENOUS | Status: DC | PRN
  Administered 2022-03-31 (×2): 10 mg via INTRAVENOUS

## 2022-03-31 MED ORDER — OXYCODONE HCL 5 MG/5ML PO SOLN
5.0000 mg | Freq: Four times a day (QID) | ORAL | Status: DC | PRN
  Administered 2022-03-31 – 2022-04-01 (×2): 5 mg via ORAL
  Filled 2022-03-31 (×2): qty 5

## 2022-03-31 MED ORDER — HYDRALAZINE HCL 20 MG/ML IJ SOLN: 10.0000 mg | INTRAMUSCULAR | Status: DC | PRN

## 2022-03-31 MED ORDER — CHLORHEXIDINE GLUCONATE 0.12 % MT SOLN
15.0000 mL | Freq: Once | OROMUCOSAL | Status: AC
  Administered 2022-03-31: 15 mL via OROMUCOSAL

## 2022-03-31 MED ORDER — BUPIVACAINE LIPOSOME 1.3 % IJ SUSP: 20.0000 mL | Freq: Once | INTRAMUSCULAR | Status: DC

## 2022-03-31 MED ORDER — ESCITALOPRAM OXALATE 20 MG PO TABS
20.0000 mg | ORAL_TABLET | Freq: Every day | ORAL | Status: DC
  Administered 2022-04-01: 20 mg via ORAL
  Filled 2022-03-31: qty 1

## 2022-03-31 MED ORDER — AMLODIPINE BESYLATE 10 MG PO TABS
10.0000 mg | ORAL_TABLET | Freq: Every day | ORAL | Status: DC
  Administered 2022-04-01: 10 mg via ORAL
  Filled 2022-03-31: qty 1

## 2022-03-31 MED ORDER — CHLORHEXIDINE GLUCONATE CLOTH 2 % EX PADS: 6.0000 | MEDICATED_PAD | Freq: Once | CUTANEOUS | Status: DC

## 2022-03-31 MED ORDER — KETAMINE HCL 100 MG/ML IJ SOLN
INTRAMUSCULAR | Status: DC | PRN
  Administered 2022-03-31: 35 mg via INTRAMUSCULAR

## 2022-03-31 MED ORDER — INSULIN ASPART 100 UNIT/ML IJ SOLN
INTRAMUSCULAR | Status: AC
  Administered 2022-03-31: 6 [IU]
  Filled 2022-03-31: qty 1

## 2022-03-31 MED ORDER — ENOXAPARIN SODIUM 30 MG/0.3ML IJ SOSY
30.0000 mg | PREFILLED_SYRINGE | Freq: Two times a day (BID) | INTRAMUSCULAR | Status: DC
  Administered 2022-03-31 – 2022-04-01 (×2): 30 mg via SUBCUTANEOUS
  Filled 2022-03-31 (×2): qty 0.3

## 2022-03-31 MED ORDER — LACTATED RINGERS IV SOLN: INTRAVENOUS | Status: DC

## 2022-03-31 MED ORDER — FENTANYL CITRATE (PF) 250 MCG/5ML IJ SOLN
INTRAMUSCULAR | Status: AC
  Filled 2022-03-31: qty 5

## 2022-03-31 MED ORDER — FENTANYL CITRATE (PF) 100 MCG/2ML IJ SOLN
INTRAMUSCULAR | Status: DC | PRN
  Administered 2022-03-31: 50 ug via INTRAVENOUS

## 2022-03-31 MED ORDER — BUPIVACAINE LIPOSOME 1.3 % IJ SUSP
INTRAMUSCULAR | Status: DC | PRN
  Administered 2022-03-31: 20 mL

## 2022-03-31 MED ORDER — ACETAMINOPHEN 160 MG/5ML PO SOLN
1000.0000 mg | Freq: Three times a day (TID) | ORAL | Status: DC
  Administered 2022-03-31 – 2022-04-01 (×2): 1000 mg via ORAL
  Filled 2022-03-31 (×2): qty 40.6

## 2022-03-31 MED ORDER — DEXTROSE-NACL 5-0.45 % IV SOLN: INTRAVENOUS | Status: DC

## 2022-03-31 MED ORDER — INSULIN ASPART 100 UNIT/ML IJ SOLN
INTRAMUSCULAR | Status: AC
  Filled 2022-03-31: qty 1

## 2022-03-31 MED ORDER — LIDOCAINE 20MG/ML (2%) 15 ML SYRINGE OPTIME
INTRAMUSCULAR | Status: DC | PRN
  Administered 2022-03-31: 1.5 mg/kg/h via INTRAVENOUS

## 2022-03-31 MED ORDER — MORPHINE SULFATE (PF) 2 MG/ML IV SOLN: 1.0000 mg | INTRAVENOUS | Status: DC | PRN

## 2022-03-31 MED ORDER — ONDANSETRON HCL 4 MG/2ML IJ SOLN
INTRAMUSCULAR | Status: DC | PRN
  Administered 2022-03-31: 4 mg via INTRAVENOUS

## 2022-03-31 MED ORDER — BUPIVACAINE-EPINEPHRINE (PF) 0.25% -1:200000 IJ SOLN
INTRAMUSCULAR | Status: AC
Start: 2022-03-31 — End: ?
  Filled 2022-03-31: qty 30

## 2022-03-31 MED ORDER — DEXAMETHASONE SODIUM PHOSPHATE 4 MG/ML IJ SOLN: 4.0000 mg | INTRAMUSCULAR | Status: DC

## 2022-03-31 MED ORDER — SODIUM CHLORIDE 0.9 % IV SOLN
2.0000 g | INTRAVENOUS | Status: AC
  Administered 2022-03-31: 2 g via INTRAVENOUS
  Filled 2022-03-31: qty 2

## 2022-03-31 MED ORDER — ONDANSETRON HCL 4 MG/2ML IJ SOLN: 4.0000 mg | INTRAMUSCULAR | Status: DC | PRN

## 2022-03-31 MED ORDER — ROCURONIUM BROMIDE 10 MG/ML (PF) SYRINGE
PREFILLED_SYRINGE | INTRAVENOUS | Status: DC | PRN
  Administered 2022-03-31: 20 mg via INTRAVENOUS
  Administered 2022-03-31: 60 mg via INTRAVENOUS
  Administered 2022-03-31 (×2): 20 mg via INTRAVENOUS

## 2022-03-31 MED ORDER — SCOPOLAMINE 1 MG/3DAYS TD PT72
1.0000 | MEDICATED_PATCH | TRANSDERMAL | Status: DC
  Administered 2022-03-31: 1.5 mg via TRANSDERMAL
  Filled 2022-03-31: qty 1

## 2022-03-31 MED ORDER — ACETAMINOPHEN 500 MG PO TABS
1000.0000 mg | ORAL_TABLET | ORAL | Status: AC
  Administered 2022-03-31: 1000 mg via ORAL
  Filled 2022-03-31: qty 2

## 2022-03-31 MED ORDER — ONDANSETRON HCL 4 MG/2ML IJ SOLN
4.0000 mg | Freq: Once | INTRAMUSCULAR | Status: DC | PRN
Start: 2022-03-31 — End: 2022-03-31

## 2022-03-31 MED ORDER — FAMOTIDINE IN NACL 20-0.9 MG/50ML-% IV SOLN
20.0000 mg | Freq: Two times a day (BID) | INTRAVENOUS | Status: DC
  Administered 2022-03-31 – 2022-04-01 (×2): 20 mg via INTRAVENOUS
  Filled 2022-03-31 (×2): qty 50

## 2022-03-31 MED ORDER — INSULIN ASPART 100 UNIT/ML IJ SOLN
0.0000 [IU] | INTRAMUSCULAR | Status: DC
  Administered 2022-03-31 (×2): 8 [IU] via SUBCUTANEOUS
  Administered 2022-04-01 (×2): 5 [IU] via SUBCUTANEOUS
  Administered 2022-04-01: 3 [IU] via SUBCUTANEOUS
  Administered 2022-04-01: 5 [IU] via SUBCUTANEOUS

## 2022-03-31 MED ORDER — INSULIN ASPART 100 UNIT/ML IJ SOLN
INTRAMUSCULAR | Status: DC | PRN
  Administered 2022-03-31: 4 [IU] via SUBCUTANEOUS

## 2022-03-31 MED ORDER — FENTANYL CITRATE PF 50 MCG/ML IJ SOSY
PREFILLED_SYRINGE | INTRAMUSCULAR | Status: AC
  Administered 2022-03-31: 50 ug via INTRAVENOUS
  Filled 2022-03-31: qty 3

## 2022-03-31 MED ORDER — LACTATED RINGERS IR SOLN
Status: DC | PRN
  Administered 2022-03-31: 1000 mL

## 2022-03-31 MED ORDER — ACETAMINOPHEN 500 MG PO TABS
1000.0000 mg | ORAL_TABLET | Freq: Three times a day (TID) | ORAL | Status: DC
Start: 2022-03-31 — End: 2022-04-01

## 2022-03-31 MED ORDER — HEPARIN SODIUM (PORCINE) 5000 UNIT/ML IJ SOLN
5000.0000 [IU] | INTRAMUSCULAR | Status: AC
  Administered 2022-03-31: 5000 [IU] via SUBCUTANEOUS
  Filled 2022-03-31: qty 1

## 2022-03-31 MED ORDER — BUPIVACAINE HCL 0.25 % IJ SOLN
INTRAMUSCULAR | Status: DC | PRN
  Administered 2022-03-31: 30 mL

## 2022-03-31 MED ORDER — MOMETASONE FURO-FORMOTEROL FUM 100-5 MCG/ACT IN AERO
2.0000 | INHALATION_SPRAY | Freq: Two times a day (BID) | RESPIRATORY_TRACT | Status: DC
  Administered 2022-03-31 – 2022-04-01 (×2): 2 via RESPIRATORY_TRACT
  Filled 2022-03-31: qty 8.8

## 2022-03-31 MED ORDER — ALBUMIN HUMAN 5 % IV SOLN: INTRAVENOUS | Status: DC | PRN

## 2022-03-31 MED ORDER — SUGAMMADEX SODIUM 500 MG/5ML IV SOLN
INTRAVENOUS | Status: DC | PRN
  Administered 2022-03-31: 400 mg via INTRAVENOUS

## 2022-03-31 MED ORDER — MIDAZOLAM HCL 2 MG/2ML IJ SOLN
INTRAMUSCULAR | Status: AC
Start: 2022-03-31 — End: ?
  Filled 2022-03-31: qty 2

## 2022-03-31 MED ORDER — SIMETHICONE 80 MG PO CHEW: 80.0000 mg | CHEWABLE_TABLET | Freq: Four times a day (QID) | ORAL | Status: DC | PRN

## 2022-03-31 MED ORDER — PHENYLEPHRINE 80 MCG/ML (10ML) SYRINGE FOR IV PUSH (FOR BLOOD PRESSURE SUPPORT)
PREFILLED_SYRINGE | INTRAVENOUS | Status: DC | PRN
  Administered 2022-03-31: 80 ug via INTRAVENOUS
  Administered 2022-03-31: 160 ug via INTRAVENOUS

## 2022-03-31 MED ORDER — ORAL CARE MOUTH RINSE: 15.0000 mL | Freq: Once | OROMUCOSAL | Status: AC

## 2022-03-31 MED ORDER — MIDAZOLAM HCL 2 MG/2ML IJ SOLN
INTRAMUSCULAR | Status: DC | PRN
  Administered 2022-03-31: 2 mg via INTRAVENOUS

## 2022-03-31 MED ORDER — KETAMINE HCL 10 MG/ML IJ SOLN
INTRAMUSCULAR | Status: AC
Start: 2022-03-31 — End: ?
  Filled 2022-03-31: qty 1

## 2022-03-31 MED ORDER — APREPITANT 40 MG PO CAPS
40.0000 mg | ORAL_CAPSULE | ORAL | Status: AC
  Administered 2022-03-31: 40 mg via ORAL
  Filled 2022-03-31: qty 1

## 2022-03-31 MED ORDER — INSULIN ASPART 100 UNIT/ML IJ SOLN: 6.0000 [IU] | Freq: Once | INTRAMUSCULAR | Status: AC

## 2022-03-31 SURGICAL SUPPLY — 80 items
APL PRP STRL LF DISP 70% ISPRP (MISCELLANEOUS) ×2
APL SKNCLS STERI-STRIP NONHPOA (GAUZE/BANDAGES/DRESSINGS)
APPLIER CLIP 5 13 M/L LIGAMAX5 (MISCELLANEOUS)
APPLIER CLIP ROT 10 11.4 M/L (STAPLE)
APPLIER CLIP ROT 13.4 12 LRG (CLIP)
APR CLP LRG 13.4X12 ROT 20 MLT (CLIP)
APR CLP MED LRG 11.4X10 (STAPLE)
APR CLP MED LRG 5 ANG JAW (MISCELLANEOUS)
BAG COUNTER SPONGE SURGICOUNT (BAG) ×3 IMPLANT
BAG SPNG CNTER NS LX DISP (BAG) ×1
BENZOIN TINCTURE PRP APPL 2/3 (GAUZE/BANDAGES/DRESSINGS) IMPLANT
BLADE SURG SZ11 CARB STEEL (BLADE) ×3 IMPLANT
BNDG ADH 1X3 SHEER STRL LF (GAUZE/BANDAGES/DRESSINGS) IMPLANT
BNDG ADH THN 3X1 STRL LF (GAUZE/BANDAGES/DRESSINGS)
CABLE HIGH FREQUENCY MONO STRZ (ELECTRODE) IMPLANT
CHLORAPREP W/TINT 26 (MISCELLANEOUS) ×4 IMPLANT
CLIP APPLIE 5 13 M/L LIGAMAX5 (MISCELLANEOUS) IMPLANT
CLIP APPLIE ROT 10 11.4 M/L (STAPLE) IMPLANT
CLIP APPLIE ROT 13.4 12 LRG (CLIP) IMPLANT
COVER SURGICAL LIGHT HANDLE (MISCELLANEOUS) ×3 IMPLANT
DEVICE SUTURE ENDOST 10MM (ENDOMECHANICALS) ×3 IMPLANT
DRAIN CHANNEL 19F RND (DRAIN) IMPLANT
DRAIN PENROSE 0.25X18 (DRAIN) ×3 IMPLANT
ELECT L-HOOK LAP 45CM DISP (ELECTROSURGICAL) ×2
ELECTRODE L-HOOK LAP 45CM DISP (ELECTROSURGICAL) ×2 IMPLANT
EVACUATOR SILICONE 100CC (DRAIN) IMPLANT
GAUZE 4X4 16PLY ~~LOC~~+RFID DBL (SPONGE) ×3 IMPLANT
GAUZE SPONGE 4X4 12PLY STRL (GAUZE/BANDAGES/DRESSINGS) IMPLANT
GLOVE BIOGEL PI IND STRL 7.0 (GLOVE) ×2 IMPLANT
GLOVE BIOGEL PI INDICATOR 7.0 (GLOVE) ×1
GLOVE SURG SS PI 7.0 STRL IVOR (GLOVE) ×3 IMPLANT
GOWN STRL REUS W/ TWL LRG LVL3 (GOWN DISPOSABLE) ×2 IMPLANT
GOWN STRL REUS W/ TWL XL LVL3 (GOWN DISPOSABLE) IMPLANT
GOWN STRL REUS W/TWL LRG LVL3 (GOWN DISPOSABLE) ×2
GOWN STRL REUS W/TWL XL LVL3 (GOWN DISPOSABLE)
GRASPER SUT TROCAR 14GX15 (MISCELLANEOUS) IMPLANT
HANDLE STAPLE EGIA 4 XL (STAPLE) ×3 IMPLANT
IRRIG SUCT STRYKERFLOW 2 WTIP (MISCELLANEOUS) ×2
IRRIGATION SUCT STRKRFLW 2 WTP (MISCELLANEOUS) ×2 IMPLANT
KIT BASIN OR (CUSTOM PROCEDURE TRAY) ×3 IMPLANT
KIT GASTRIC LAVAGE 34FR ADT (SET/KITS/TRAYS/PACK) IMPLANT
MARKER SKIN DUAL TIP RULER LAB (MISCELLANEOUS) ×3 IMPLANT
MAT PREVALON FULL STRYKER (MISCELLANEOUS) ×3 IMPLANT
NDL SPNL 22GX3.5 QUINCKE BK (NEEDLE) ×2 IMPLANT
NEEDLE SPNL 22GX3.5 QUINCKE BK (NEEDLE) ×2 IMPLANT
PACK CARDIOVASCULAR III (CUSTOM PROCEDURE TRAY) ×3 IMPLANT
PENCIL SMOKE EVACUATOR (MISCELLANEOUS) IMPLANT
RELOAD EGIA 45 MED/THCK PURPLE (STAPLE) ×3 IMPLANT
RELOAD EGIA 45 TAN VASC (STAPLE) IMPLANT
RELOAD EGIA 60 MED/THCK PURPLE (STAPLE) ×8 IMPLANT
RELOAD EGIA 60 TAN VASC (STAPLE) ×9 IMPLANT
RELOAD ENDO STITCH 2.0 (ENDOMECHANICALS) ×24
RELOAD STAPLE 60 MED/THCK ART (STAPLE) ×8 IMPLANT
RELOAD SUT SNGL STCH ABSRB 2-0 (ENDOMECHANICALS) ×10 IMPLANT
RELOAD SUT SNGL STCH BLK 2-0 (ENDOMECHANICALS) ×12 IMPLANT
SCISSORS LAP 5X45 EPIX DISP (ENDOMECHANICALS) ×3 IMPLANT
SET TUBE SMOKE EVAC HIGH FLOW (TUBING) ×3 IMPLANT
SHEARS HARMONIC ACE PLUS 45CM (MISCELLANEOUS) ×3 IMPLANT
SLEEVE Z-THREAD 5X100MM (TROCAR) ×9 IMPLANT
SOL ANTI FOG 6CC (MISCELLANEOUS) ×2 IMPLANT
SOLUTION ANTI FOG 6CC (MISCELLANEOUS) ×1
STAPLER VISISTAT 35W (STAPLE) IMPLANT
STRIP CLOSURE SKIN 1/2X4 (GAUZE/BANDAGES/DRESSINGS) IMPLANT
SUT ETHIBOND 0 36 GRN (SUTURE) IMPLANT
SUT ETHILON 2 0 PS N (SUTURE) IMPLANT
SUT MNCRL AB 4-0 PS2 18 (SUTURE) ×3 IMPLANT
SUT RELOAD ENDO STITCH 2 48X1 (ENDOMECHANICALS) ×5
SUT RELOAD ENDO STITCH 2.0 (ENDOMECHANICALS) ×7
SUT SILK 0 SH 30 (SUTURE) IMPLANT
SUT VICRYL 0 TIES 12 18 (SUTURE) IMPLANT
SUTURE RELOAD END STTCH 2 48X1 (ENDOMECHANICALS) ×5 IMPLANT
SUTURE RELOAD ENDO STITCH 2.0 (ENDOMECHANICALS) ×7 IMPLANT
SYR 20ML LL LF (SYRINGE) ×3 IMPLANT
SYR 50ML LL SCALE MARK (SYRINGE) ×3 IMPLANT
TOWEL OR 17X26 10 PK STRL BLUE (TOWEL DISPOSABLE) ×3 IMPLANT
TOWEL OR NON WOVEN STRL DISP B (DISPOSABLE) ×3 IMPLANT
TROCAR ADV FIXATION 12X100MM (TROCAR) ×3 IMPLANT
TROCAR Z-THREAD OPTICAL 5X100M (TROCAR) ×3 IMPLANT
TUBING CONNECTING 10 (TUBING) ×3 IMPLANT
TUBING ENDO SMARTCAP (MISCELLANEOUS) ×1 IMPLANT

## 2022-03-31 NOTE — Op Note (Signed)
Preop Diagnosis: Obesity Class III  Postop Diagnosis: same  Procedure performed: laparoscopic Roux en Y gastric bypass  Assitant: Kaylyn Lim  Indications:  The patient is a 56 y.o. year-old morbidly obese male who has been followed in the Bariatric Clinic as an outpatient. This patient was diagnosed with morbid obesity with a BMI of Body mass index is 35.1 kg/m. and significant co-morbidities including hypertension, non-insulin dependent diabetes, and sleep apnea.  The patient was counseled extensively in the Bariatric Outpatient Clinic and after a thorough explanation of the risks and benefits of surgery (including death from complications, bowel leak, infection such as peritonitis and/or sepsis, internal hernia, bleeding, need for blood transfusion, bowel obstruction, organ failure, pulmonary embolus, deep venous thrombosis, wound infection, incisional hernia, skin breakdown, and others entailed on the consent form) and after a compliant diet and exercise program, the patient was scheduled for an elective laparoscopic gastric bypass.  Description of Operation:  Following informed consent, the patient was taken to the operating room and placed on the operating table in the supine position.  He had previously received prophylactic antibiotics and subcutaneous heparin for DVT prophylaxis in the pre-op holding area.  After induction of general endotracheal anesthesia by the anesthesiologist, the patient underwent placement of sequential compression devices, Foley catheter and an oro-gastric tube.  A timeout was confirmed by the surgery and anesthesia teams.  The patient was adequately padded at all pressure points and placed on a footboard to prevent slippage from the OR table during extremes of position during surgery.  He underwent a routine sterile prep and drape of her entire abdomen.    Next, A transverse incision was made under the left subcostal area and a 62m optical viewing trocar was  introduced into the peritoneal cavity. Pneumoperitoneum was applied with a high flow and low pressure. A laparoscope was inserted to confirm placement. A extraperitoneal block was then placed at the lateral abdominal wall using exparel diluted with marcaine . 5 additional trocars were placed: 1 544mtrocar to the left of the midline. 1 additional 18m59mrocar in the left lateral area, 1 70m60mocar in the right mid abdomen, and 1 18mm 61mcar in the right subcostal area.  The greater omentum was flipped over the transverse colon and under the left lobe of the liver. The ligament of trietz was identified. 40cm of jejunum was measured starting from the ligament of Trietz. The mesentery was checked to ensure mobility. Next, a 60mm 56mm tr30mapler was used to divide the jejunum at this location. The harmonic scalpel was used to divide the mesentery down to the origin. A 1/2" penrose was sutured to the distal side. 100cm of jejunum was measured starting at the division. 2-0 silk was used to appose the biliary limb to the 100cm mark of jejunum in 2 places. Enterotomies were made in the biliary and common channels and a 60mm 2-47mistapler was used to create the J-J anastomosis. A 2-0 silk was used to appose the enterotomy edges and a 60mm 2-379mstapler was used to close the enterotomy. An anti-obstruction 2-0 silk suture was placed. Next, the mesenteric defect was closed with a 2-0 silk in running fashion.The J-J appeared patent and in neutral position.  Next, the omentum was divided using the Harmonic scalpel. The patient was placed in steep Reverse Trendelenberg position. A Nathanson retracted was placed through a subxiphoid incision and used to retract the liver. The fat pad over the fundus was incised to free the fundus. Next, a position  along the lesser curve 6cm from GE junction was identified. The pars flaccida was entered and the fat over the lesser curve divided to enter the lesser sac. Multiple 61m 3-469m tristaple firings were peformed to create a 6cm pouch. The Roux limb was identified using the placed penrose and brought up to the stomach in antecolic fashion. The limb was inspected to ensure a neutral position. A 2-0 vicryl suture was then used to create a posterior layer connecting the stomach to the Roux limb jejunum in running fashion. Next cautery was used to create an enterotomy along the medial aspect of this suture line and Harmonic scalpel used to create gastotomy. A 4539m-4mm28mistapler was then used to create a 25-30mm40mstomosis. 2 2-0 vicryl sutures were used in running fashion to close the gastrotomy. Finally, a 2-0 vicryl suture was used to close an anterior layer of stomach and jejunum over the anastomosis in running fashion. The penrose was removed from the Roux limb. A 2-0 silk was used to appose the transverse mesocolon to the mesentery of the Roux limb.  The assistant then went and performed an upper endoscopy and leak test.  No bubbles were seen and the pouch and limb distended appropriately. The limb and pouch were deflated, the endoscope was removed. Hemostasis was ensured. Pneumoperitoneum was evacuated, all ports were removed and all incisions closed with 4-0 monocryl suture in subcuticular fashion. Steristrips and bandaids were put in place for dressing. The patient awoke from anesthesia and was brought to pacu in stable condition. All counts were correct.  Specimens:  None  Estimated Blood Loss: 20 ml  Local Anesthesia: 50 ml Exparel: 0.5% Marcaine Mix  Post-Op Plan:       Pain Management: PO, prn      Antibiotics: Prophylactic      Anticoagulation: Prophylactic, Starting now      Post Op Studies/Consults: Not applicable      Intended Discharge: within 48h      Intended Outpatient Follow-Up: Two Week      Intended Outpatient Studies: Not Applicable      Other: Not Applicable   Chloey Ricard Arta Bruceinger

## 2022-03-31 NOTE — Progress Notes (Signed)
PHARMACY CONSULT FOR:  Risk Assessment for Post-Discharge VTE Following Bariatric Surgery  Post-Discharge VTE Risk Assessment: This patient's probability of 30-day post-discharge VTE is increased due to the factors marked:  Sleeve gastrectomy   Liver disorder (transplant, cirrhosis, or nonalcoholic steatohepatitis)   Hx of VTE   Hemorrhage requiring transfusion   GI perforation, leak, or obstruction   ====================================================  X  Male    Age >/=60 years    BMI >/=50 kg/m2    CHF    Dyspnea at Rest    Paraplegia  X  Non-gastric-band surgery    Operation Time >/=3 hr    Return to OR     Length of Stay >/= 3 d   Hypercoagulable condition   Significant venous stasis      Predicted probability of 30-day post-discharge VTE: 0.31%  Other patient-specific factors to consider: no  Recommendation for Discharge: No pharmacologic prophylaxis post-dischargeJayson E Fox is a 56 y.o. 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  No results for input(s): "WBC", "HGB", "HCT", "PLT", "APTT", "CREATININE", "LABCREA", "CREAT24HRUR", "MG", "PHOS", "ALBUMIN", "PROT", "AST", "ALT", "ALKPHOS", "BILITOT", "BILIDIR", "IBILI" in the last 72 hours. Estimated Creatinine Clearance: 39.3 mL/min (A) (by C-G formula based on SCr of 2.57 mg/dL (H)).    Past Medical History:  Diagnosis Date   Asthma    Chronic kidney disease    stage 4   Constipation    Depression    Diabetes mellitus without complication ([name]    GERD (gastroesophageal reflux disease)    Gout    hx of gout   H/O hiatal hernia    Hyperlipidemia    Hypertension    Prediabetes     Proteinuria 2005   Sleep apnea    uses c-pap   SOB (shortness of breath) on exertion    Vitamin D deficiency      Medications Prior to Admission  Medication Sig Dispense Refill Last Dose   albuterol (PROAIR HFA) 108 (90 Base) MCG/ACT inhaler Inhale 2 puffs into the lungs every 6 (six) hours as needed for wheezing or shortness of breath. 1 each 5 03/31/2022 at 0330   allopurinol (ZYLOPRIM) 300 MG tablet Take 1 tablet by mouth once daily 90 tablet 0 03/31/2022 at 0330   amLODipine (NORVASC) 10 MG tablet Take 1 tablet by mouth once daily 90 tablet 1 03/31/2022 at 0330   budesonide-formoterol (SYMBICORT) 80-4.5 MCG/ACT inhaler Inhale 2 puffs into the lungs 2 (two) times daily. 1 each 5 03/31/2022 at 0330   cetirizine (KLS ALLER-TEC) 10 MG tablet Take 10 mg by mouth at bedtime.   03/30/2022   clomiPHENE (CLOMID) 50 MG tablet Take 50 mg by mouth daily.   03/30/2022   escitalopram (LEXAPRO) 20 MG tablet Take 1 tablet by mouth once daily 90 tablet 0 03/31/2022 at 0330   famotidine (PEPCID) 20 MG tablet Take 2 tablets (40 mg total) by mouth daily.   03/31/2022 at 0330   FARXIGA 10 MG TABS tablet Take 10 mg by mouth daily.   03/29/2022   losartan (COZAAR) 100 MG tablet Take 1 tablet by mouth once daily 90 tablet 1 03/30/2022  Multiple Vitamin (MULTIVITAMIN ADULT PO) Take 1 tablet by mouth daily.   03/30/2022   nebivolol (BYSTOLIC) 5 MG tablet Take 1 tablet by mouth once daily 90 tablet 0 03/31/2022 at 0330   ondansetron (ZOFRAN-ODT) 4 MG disintegrating tablet Take 1 tablet (4 mg total) by mouth every 8 (eight) hours as needed for nausea or vomiting. 21 tablet 0 Past Week   pravastatin (PRAVACHOL) 10 MG tablet Take 1 tablet by mouth once daily 90 tablet 1 03/31/2022 at 0330   Turmeric 500 MG CAPS Take 500 mg by mouth daily.   03/30/2022   OZEMPIC, 0.25 OR 0.5 MG/DOSE, 2 MG/1.5ML SOPN INJECT 0.25 MG  SUBCUTANEOUSLY ONCE A WEEK FOR 30 DAYS THEN THEN 0.5 MG  ONCE A WEEK (Patient not taking: Reported on 03/23/2022) 2 mL  0 Completed Course   Vitamin D, Ergocalciferol, (DRISDOL) 1.25 MG (50000 UNIT) CAPS capsule Take 1 capsule (50,000 Units total) by mouth every 7 (seven) days. (Patient not taking: Reported on 03/23/2022) 4 capsule 0 Not Taking    Eudelia Bunch, Pharm.D 03/31/2022 10:29 AM

## 2022-03-31 NOTE — Transfer of Care (Signed)
Immediate Anesthesia Transfer of Care Note  Patient: Laroy Apple  Procedure(s) Performed: LAPAROSCOPIC ROUX-EN-Y GASTRIC BYPASS WITH UPPER ENDOSCOPY  Patient Location: PACU  Anesthesia Type:General  Level of Consciousness: awake, sedated, drowsy and patient cooperative  Airway & Oxygen Therapy: Patient connected to face mask oxygen  Post-op Assessment: Report given to RN and Post -op Vital signs reviewed and stable  Post vital signs: stable  Last Vitals:  Vitals Value Taken Time  BP 136/82 03/31/22 1015  Temp    Pulse 80 03/31/22 1016  Resp 11 03/31/22 1016  SpO2 96 % 03/31/22 1016  Vitals shown include unvalidated device data.  Last Pain:  Vitals:   03/31/22 0555  TempSrc: Oral  PainSc:          Complications: No notable events documented.

## 2022-03-31 NOTE — Anesthesia Postprocedure Evaluation (Signed)
Anesthesia Post Note  Patient: Frederick Fox  Procedure(s) Performed: LAPAROSCOPIC ROUX-EN-Y GASTRIC BYPASS WITH UPPER ENDOSCOPY     Patient location during evaluation: PACU Anesthesia Type: General Level of consciousness: awake and alert Pain management: pain level controlled Vital Signs Assessment: post-procedure vital signs reviewed and stable Respiratory status: spontaneous breathing, nonlabored ventilation, respiratory function stable and patient connected to nasal cannula oxygen Cardiovascular status: blood pressure returned to baseline and stable Postop Assessment: no apparent nausea or vomiting Anesthetic complications: no   No notable events documented.  Last Vitals:  Vitals:   03/31/22 1355 03/31/22 1450  BP: 140/87 136/84  Pulse: 79 70  Resp: 18 18  Temp: 36.8 C 36.8 C  SpO2: 98% 100%    Last Pain:  Vitals:   03/31/22 1652  TempSrc:   PainSc: [place]

## 2022-03-31 NOTE — Anesthesia Procedure Notes (Signed)
Procedure Name: Intubation Date/Time: 03/31/2022 7:42 AM  Performed by: Pilar Grammes, CRNAPre-anesthesia Checklist: Patient identified, Emergency Drugs available, Suction available, Patient being monitored and Timeout performed Patient Re-evaluated:Patient Re-evaluated prior to induction Oxygen Delivery Method: Nasal cannula Preoxygenation: Pre-oxygenation with 100% oxygen Induction Type: IV induction Ventilation: Two handed mask ventilation required Laryngoscope Size: Mac and 4 Grade View: Grade I Tube type: Oral Tube size: 7.5 mm Number of attempts: 1 Airway Equipment and Method: Stylet and Video-laryngoscopy Placement Confirmation: positive ETCO2, ETT inserted through vocal cords under direct vision, CO2 detector and breath sounds checked- equal and bilateral Secured at: 23 cm Tube secured with: Tape Dental Injury: Teeth and Oropharynx as per pre-operative assessment

## 2022-03-31 NOTE — Discharge Instructions (Signed)

## 2022-03-31 NOTE — Op Note (Signed)
Frederick Fox [id number] May 11, 1966 03/31/2022  Preoperative diagnosis: roux en Y gastric bypass in progress  Postoperative diagnosis: Same   Procedure: Upper endoscopy   Surgeon: Catalina Antigua B. Hassell Done  M.D., FACS   Anesthesia: Gen.   Indications for procedure: This patient was undergoing a roux en Y gastric bypass.    Description of procedure: The endoscopy was placed in the mouth and into the oropharynx and under endoscopic vision it was advanced to the esophagogastric junction.  The pouch was insufflated and measured to be ~ 5 cm in length.   No bleeding or leaks were detected.  The scope was withdrawn without difficulty.     Matt B. Hassell Done, MD, FACS General, Bariatric, & Minimally Invasive Surgery [facility], [place]

## 2022-03-31 NOTE — H&P (Signed)
Chief Complaint: Weight Loss   History of Present Illness: Frederick Fox is a 56 y.o. male who is seen today for preop bariatric visit.  He is doing well. He has lost 9 pounds since August. He still has issues with diabetes control.  Review of Systems: A complete review of systems was obtained from the patient. I have reviewed this information and discussed as appropriate with the patient. See HPI as well for other ROS.  Review of Systems  Constitutional: Negative.  HENT: Negative.  Eyes: Negative.  Respiratory: Negative.  Cardiovascular: Negative.  Gastrointestinal: Negative.  Genitourinary: Negative.  Musculoskeletal: Negative.  Skin: Negative.  Neurological: Negative.  Endo/Heme/Allergies: Negative.  Psychiatric/Behavioral: Negative.    Medical History: Past Medical History:  Diagnosis Date   Anxiety   Asthma, unspecified asthma severity, unspecified whether complicated, unspecified whether persistent   Chronic kidney disease   GERD (gastroesophageal reflux disease)   Hypertension   Sleep apnea   There is no problem list on file for this patient.  History reviewed. No pertinent surgical history.   Allergies  Allergen Reactions   Lisinopril Unknown  cough   Bupropion Unknown  nightmares   Rosuvastatin Calcium Unknown  Fingers joint pain   Current Outpatient Medications on File Prior to Visit  Medication Sig Dispense Refill   albuterol 90 mcg/actuation inhaler Inhale into the lungs   allopurinoL (ZYLOPRIM) 100 MG tablet 1 tablet   amLODIPine (NORVASC) 10 MG tablet Take 10 mg by mouth once daily   clomiPHENE (CLOMID) 50 mg tablet Take by mouth   ergocalciferol, vitamin D2, 1,250 mcg (50,000 unit) capsule Take 50,000 Units by mouth every 7 (seven) days   escitalopram oxalate (LEXAPRO) 20 MG tablet Take 1 tablet by mouth once daily   famotidine (PEPCID) 20 MG tablet 1 tablet at bedtime as needed   hydroCHLOROthiazide (HYDRODIURIL) 25 MG tablet as  directed   losartan (COZAAR) 100 MG tablet 1 tablet   nebivoloL (BYSTOLIC) 5 MG tablet Take 1 tablet by mouth once daily   pravastatin (PRAVACHOL) 10 MG tablet Take 1 tablet by mouth once daily   semaglutide (RYBELSUS) 14 mg tablet Take by mouth   No current facility-administered medications on file prior to visit.   Family History  Problem Relation Age of Onset   Obesity Mother    Social History   Tobacco Use  Smoking Status Never  Smokeless Tobacco Never    Social History   Socioeconomic History   Marital status: Unknown  Tobacco Use   Smoking status: Never   Smokeless tobacco: Never  Substance and Sexual Activity   Alcohol use: Yes  Comment: Socially   Drug use: Never   Objective:   Vitals:  01/21/22 0931  BP: (!) 160/90  Pulse: 89  Temp: 37.2 C (98.9 F)  SpO2: 98%  Weight: (!) 112.6 kg (248 lb 3.2 oz)  Height: 174 cm (5' 8.5")   Body mass index is 37.19 kg/m.  Physical Exam Constitutional:  Appearance: Normal appearance.  HENT:  Head: Normocephalic and atraumatic.  Pulmonary:  Effort: Pulmonary effort is normal.  Musculoskeletal:  General: Normal range of motion.  Cervical back: Normal range of motion.  Neurological:  General: No focal deficit present.  Mental Status: He is alert and oriented to person, place, and time. Mental status is at baseline.  Psychiatric:  Mood and Affect: Mood normal.  Behavior: Behavior normal.  Thought Content: Thought content normal.     Labs, Imaging and Diagnostic Testing:  I reviewed notes  from dietician and psychology  Assessment and Plan:   Diagnoses and all orders for this visit:  Essential hypertension  Class 2 severe obesity with body mass index (BMI) of 35 to 39.9 with serious comorbidity (CMS-[name]  Obstructive sleep apnea syndrome  Stage 3b chronic kidney disease (CMS-[name]  Type 2 diabetes mellitus with stage 3b chronic kidney disease, without long-term current use of insulin  (CMS-[name]    The patient meets weight loss surgery criteria. Due to the above reasons, I think laparoscopic RNY gastric bypass is the best option for the patient.   We discussed RNY gastric bypass. We discussed the preoperative, operative and postoperative process. I explained the surgery in detail including the performance of an EGD near the end of the surgery to test for leak. We discussed the typical hospital course including a 1-2 day stay baring any complications. The patient was given Neurosurgeon. We did discuss the possibility of weight regain several years after the procedure.  The risks of infection, bleeding, pain, scarring, weight regain, too little or too much weight loss, vitamin deficiencies and need for lifelong vitamin supplementation, hair loss, need for protein supplementation, leaks, stricture, reflux, food intolerance, gallstone formation, hernia, need for reoperation, need for open surgery, injury to spleen or surrounding structures, DVT's, PE, and death again discussed with the patient and the patient expressed understanding and desires to proceed with laparoscopic Roux en Y gastric bypass, possible open, intraoperative endoscopy.  We discussed that before and after surgery that there would be an alteration in their diet. I explained that we may put them on a diet 2 weeks before surgery. I also explained that they would be on a liquid diet for 2 weeks after surgery. We discussed that they would have to avoid certain foods after surgery. We discussed the importance of physical activity as well as compliance with our dietary and supplement recommendations and routine follow-up.

## 2022-03-31 NOTE — Progress Notes (Signed)

## 2022-04-01 ENCOUNTER — Encounter (HOSPITAL_COMMUNITY): Payer: Self-pay | Admitting: General Surgery

## 2022-04-01 ENCOUNTER — Other Ambulatory Visit (HOSPITAL_COMMUNITY): Payer: Self-pay

## 2022-04-01 LAB — CBC WITH DIFFERENTIAL/PLATELET
Abs Immature Granulocytes: 0.07 10*3/uL (ref 0.00–0.07)
Basophils Absolute: 0 10*3/uL (ref 0.0–0.1)
Basophils Relative: 0 %
Eosinophils Absolute: 0 10*3/uL (ref 0.0–0.5)
Eosinophils Relative: 0 %
HCT: 41.4 % (ref 39.0–52.0)
Hemoglobin: 13.1 g/dL (ref 13.0–17.0)
Immature Granulocytes: 1 %
Lymphocytes Relative: 16 %
Lymphs Abs: 1.7 10*3/uL (ref 0.7–4.0)
MCH: 27.5 pg (ref 26.0–34.0)
MCHC: 31.6 g/dL (ref 30.0–36.0)
MCV: 86.8 fL (ref 80.0–100.0)
Monocytes Absolute: 1 10*3/uL (ref 0.1–1.0)
Monocytes Relative: 9 %
Neutro Abs: 8 10*3/uL — ABNORMAL HIGH (ref 1.7–7.7)
Neutrophils Relative %: 74 %
Platelets: 187 10*3/uL (ref 150–400)
RBC: 4.77 MIL/uL (ref 4.22–5.81)
RDW: 14 % (ref 11.5–15.5)
WBC: 10.8 10*3/uL — ABNORMAL HIGH (ref 4.0–10.5)
nRBC: 0 % (ref 0.0–0.2)

## 2022-04-01 LAB — GLUCOSE, CAPILLARY
Glucose-Capillary: 173 mg/dL — ABNORMAL HIGH (ref 70–99)
Glucose-Capillary: 209 mg/dL — ABNORMAL HIGH (ref 70–99)
Glucose-Capillary: 213 mg/dL — ABNORMAL HIGH (ref 70–99)
Glucose-Capillary: 220 mg/dL — ABNORMAL HIGH (ref 70–99)

## 2022-04-01 MED ORDER — PANTOPRAZOLE SODIUM 40 MG PO TBEC
40.0000 mg | DELAYED_RELEASE_TABLET | Freq: Every day | ORAL | 0 refills | Status: AC
  Filled 2022-04-01: qty 60, 60d supply, fill #0

## 2022-04-01 MED ORDER — OXYCODONE HCL 5 MG PO TABS
5.0000 mg | ORAL_TABLET | Freq: Four times a day (QID) | ORAL | 0 refills | Status: DC | PRN
  Filled 2022-04-01: qty 10, 3d supply, fill #0

## 2022-04-01 MED ORDER — ONDANSETRON 4 MG PO TBDP
4.0000 mg | ORAL_TABLET | Freq: Four times a day (QID) | ORAL | 0 refills | Status: DC | PRN
  Filled 2022-04-01: qty 20, 5d supply, fill #0

## 2022-04-01 MED ORDER — ACETAMINOPHEN 500 MG PO TABS: 1000.0000 mg | ORAL_TABLET | Freq: Three times a day (TID) | ORAL | 0 refills | Status: AC

## 2022-04-01 NOTE — Plan of Care (Signed)

## 2022-04-01 NOTE — Progress Notes (Signed)
Patient alert and oriented, pain is controlled. Patient is tolerating fluids, advanced to protein shake today, patient is tolerating well. Reviewed Gastric Bypass discharge instructions with patient and patient is able to articulate understanding. Provided information on BELT program, Support Group and [facility] outpatient pharmacy. All questions answered, will continue to monitor.    

## 2022-04-01 NOTE — Progress Notes (Signed)
  Transition of Care [facility]) Screening Note   Patient Details  Name: MATHEU PLOEGER Date of Birth: 09-14-1966   Transition of Care [facility]) CM/SW Contact:    Lennart Pall, LCSW Phone Number: 04/01/2022, 10:04 AM    Transition of Care Department [facility]) has reviewed patient and no TOC needs have been identified at this time. We will continue to monitor patient advancement through interdisciplinary progression rounds. If new patient transition needs arise, please place a TOC consult.

## 2022-04-01 NOTE — Inpatient Diabetes Management (Addendum)
Inpatient Diabetes Program Recommendations  AACE/ADA: New Consensus Statement on Inpatient Glycemic Control (2015)  Target Ranges:  Prepandial:   less than 140 mg/dL      Peak postprandial:   less than 180 mg/dL (1-2 hours)      Critically ill patients:  140 - 180 mg/dL   Lab Results  Component Value Date   GLUCAP 220 (H) 04/01/2022   HGBA1C 11.1 (H) 03/26/2022    Review of Glycemic Control  Latest Reference Range & Units 03/31/22 19:17 04/01/22 00:06 04/01/22 04:02 04/01/22 07:29  Glucose-Capillary 70 - 99 mg/dL 270 (H) 209 (H) 173 (H) 220 (H)  (H): Data is abnormally high Diabetes history: Type 2 DM Outpatient Diabetes medications: Ozempic 0.5 mg qwk, Farxiga 10 mg QD Current orders for Inpatient glycemic control: Novolog 0-15 units Q4H Decadron 4 mg x 1  Inpatient Diabetes Program Recommendations:    Consider adding Semglee 10 units QD.   Addendum: Spoke with patient regarding outpatient diabetes management. Patient admits that his intake of CHO has significantly increased as he mentally was preparing for surgery. Has not been taking Ozempic as prescribed. Reviewed patient's current A1c of 11.1%. Explained what a A1c is and what it measures. Also reviewed goal A1c with patient, importance of good glucose control @ home, and blood sugar goals. Reviewed patho of DM, need for improved control, risk for infection given recent surgery, survival skills, impact of GLP and side effects, and other commorbidities. Patient has a meter and supplies. Interested in [place]. Samples provided. Reviewed recommended frequency of checking and when to call MD.  Admits to over eating especially carbohydrates. Reviewed importance of alternatives to sugary beverages and changes to insulin needs now with bypass surgery. Encouraged to continue [place] as prescribed. Patient has follow up appointment scheduled. No further questions at this time.   Thanks, Bronson Curb, MSN,  [facility] Diabetes Coordinator [phone] (8a-5p)

## 2022-04-01 NOTE — Progress Notes (Signed)
Discharge instructions discussed with patient, verbalized agreement and understanding 

## 2022-04-06 ENCOUNTER — Encounter: Payer: Self-pay | Admitting: Internal Medicine

## 2022-04-07 ENCOUNTER — Ambulatory Visit: Payer: BC Managed Care – PPO | Admitting: Internal Medicine

## 2022-04-07 VITALS — BP 128/76 | HR 76 | Temp 98.5°F | Ht 69.0 in | Wt 225.0 lb

## 2022-04-07 DIAGNOSIS — E785 Hyperlipidemia, unspecified: Secondary | ICD-10-CM

## 2022-04-07 DIAGNOSIS — I10 Essential (primary) hypertension: Secondary | ICD-10-CM

## 2022-04-07 DIAGNOSIS — E1122 Type 2 diabetes mellitus with diabetic chronic kidney disease: Secondary | ICD-10-CM | POA: Diagnosis not present

## 2022-04-07 DIAGNOSIS — N184 Chronic kidney disease, stage 4 (severe): Secondary | ICD-10-CM | POA: Diagnosis not present

## 2022-04-07 DIAGNOSIS — Z6837 Body mass index (BMI) 37.0-37.9, adult: Secondary | ICD-10-CM

## 2022-04-07 DIAGNOSIS — E1169 Type 2 diabetes mellitus with other specified complication: Secondary | ICD-10-CM | POA: Diagnosis not present

## 2022-04-07 DIAGNOSIS — Z794 Long term (current) use of insulin: Secondary | ICD-10-CM

## 2022-04-07 DIAGNOSIS — M1A9XX0 Chronic gout, unspecified, without tophus (tophi): Secondary | ICD-10-CM

## 2022-04-07 NOTE — Assessment & Plan Note (Addendum)
Chronic Blood pressure well controlled Continue amlodipine 10 mg daily, losartan 100 mg daily, Bystolic 5 mg daily As he continues to lose weight anticipate that he will be able to come off some medication-likely can start with Bystolic, but he will check with Dr. Signe Colt to see if she has a preference Stressed that he needs to monitor his BP closely at home

## 2022-04-07 NOTE — Assessment & Plan Note (Signed)
Chronic Had laparoscopic Roux-en-Y gastric bypass 1 week ago and overall doing very well He is getting in the right amount of protein and fluids He has lost a good amount of weight already Continue bariatric program and diet Monitor sugars and blood pressure closely Discussed that we may need to adjust medications

## 2022-04-13 ENCOUNTER — Encounter: Payer: BC Managed Care – PPO | Attending: General Surgery | Admitting: Dietician

## 2022-04-13 ENCOUNTER — Encounter: Payer: Self-pay | Admitting: Dietician

## 2022-04-13 DIAGNOSIS — E1122 Type 2 diabetes mellitus with diabetic chronic kidney disease: Secondary | ICD-10-CM | POA: Insufficient documentation

## 2022-04-13 DIAGNOSIS — Z6832 Body mass index (BMI) 32.0-32.9, adult: Secondary | ICD-10-CM | POA: Diagnosis not present

## 2022-04-13 DIAGNOSIS — E669 Obesity, unspecified: Secondary | ICD-10-CM | POA: Insufficient documentation

## 2022-04-13 DIAGNOSIS — Z794 Long term (current) use of insulin: Secondary | ICD-10-CM | POA: Insufficient documentation

## 2022-04-13 DIAGNOSIS — N184 Chronic kidney disease, stage 4 (severe): Secondary | ICD-10-CM | POA: Insufficient documentation

## 2022-04-13 DIAGNOSIS — Z713 Dietary counseling and surveillance: Secondary | ICD-10-CM | POA: Diagnosis not present

## 2022-04-13 NOTE — Progress Notes (Signed)
2 Week Post-Operative Nutrition Class   Patient was seen on 04/13/2022 for Post-Operative Nutrition education at the [facility].    Surgery date: 03/31/2022 Surgery type: RYGB  NUTRITION ASSESSMENT   Anthropometrics  Start weight at [facility]: 251.3 lbs (date: 09/03/2021)  Height: 69 in Weight today: 221.1 BMI: 33.23 kg/m2     Clinical  Medical hx: Depression, stage 3 kidney disease, sleep apnea, GERD, DM Medications: see list: vitamin D  Labs: A1C 7.3, BUN 38, creatinine 2.81, GFR 26  Notable signs/symptoms: currently a cough; chronic reflux  Any previous deficiencies? Yes, vitamin d   Body Composition Scale 04/13/2022  Current Body Weight 221.1  Total Body Fat % 27.9  Visceral Fat 19  Fat-Free Mass % 72.0   Total Body Water % 53.0  Muscle-Mass lbs 41.6  BMI 32.4  Body Fat Displacement          Torso  lbs 38.3         Left Leg  lbs 7.6         Right Leg  lbs 7.6         Left Arm  lbs 3.8         Right Arm   lbs 3.8      The following the learning objectives were met by the patient during this course: Identifies Phase 3 (Soft, High Proteins) Dietary Goals and will begin from 2 weeks post-operatively to 2 months post-operatively Identifies appropriate sources of fluids and proteins  Identifies appropriate fat sources and healthy verses unhealthy fat types   States protein recommendations and appropriate sources post-operatively Identifies the need for appropriate texture modifications, mastication, and bite sizes when consuming solids Identifies appropriate fat consumption and sources Identifies appropriate multivitamin and calcium sources post-operatively Describes the need for physical activity post-operatively and will follow MD recommendations States when to call healthcare provider regarding medication questions or post-operative complications   Handouts given during class include: Phase 3A: Soft, High Protein Diet Handout Phase 3 High  Protein Meals Healthy Fats   Follow-Up Plan: Patient will follow-up at [facility] in 6 weeks for 2 month post-op nutrition visit for diet advancement per MD.

## 2022-04-14 ENCOUNTER — Telehealth (HOSPITAL_COMMUNITY): Payer: Self-pay | Admitting: *Deleted

## 2022-04-14 NOTE — Telephone Encounter (Signed)
Tell me about your pain and pain management?  Pt denies pain, just felt sore     2. ?Let's talk about fluid intake. ?How much total fluid are you taking in?   Pt states that he is exceeding 64 of fluid including protein shakes, and water.  Pt instructed to assess status and suggestions daily utilizing Hydration Action Plan on discharge folder and to call [facility] if in the "red zone".     3. ?How much protein have you taken in the last day?  Pt states that he has been able to get to about 60g of the 80g protein goal. Pt is working on increasing protein so far today. Pt plans to drink remainder of protein throughout the rest of the day to meet goal. Pt encouraged to focus on meeting the protein goal first.    4. ?Have you had nausea? ?Tell me about when have experienced nausea and what you did to help?  Pt denied nausea  ?  5. ?Has the frequency or color changed with your urine?  Pt states that he is urinating "fine" with no changes in frequency or urgency.?  ?  6. ?Tell me what your incisions look like?  "Incisions look fine".?Pt denies a fever, chills. ?Pt states incisions are not swollen, open, or draining. ?Pt encouraged to call [facility] if incisions change.  ?  7. ?Have you been passing gas? BM?  Pt states that he has had a BM. Last BM 04/03/22   8. ?If a problem or question were to arise who would you call? ?Do you know contact numbers for [place], [facility], and [facility]?  Pt instructed to continue trying to meet fluid intake goal and to get plenty of fluid prior to rest or soon after resting. Pt can describe s/sx of dehydration. ?Pt knows to call [facility] for surgical, [facility] for nutrition, and [place] for non-urgent questions or concerns.  ?  9. ?How has the walking been going?  Pt states he is walking around and able to be active without difficulty.  Pt reported walking around in the house ?  10. Are you still using your incentive spirometer?   Pt reported using incentive spirometer. Pt encouraged to use  incentive spirometer, at least 10x every hour while awake until s/he sees the surgeon.   11. ?How are your vitamins and calcium going? ?How are you taking them?  Pt discussed options to take multivitamins Reinforced taking supplements two hours apart from multivitamin

## 2022-04-16 ENCOUNTER — Encounter: Payer: Self-pay | Admitting: Internal Medicine

## 2022-04-20 ENCOUNTER — Telehealth: Payer: Self-pay | Admitting: Dietician

## 2022-04-20 NOTE — Telephone Encounter (Signed)
RD called pt to verify fluid intake once starting soft, solid proteins 2 week post-bariatric surgery.   Daily Fluid intake:  Daily Protein intake:  Bowel Habits:   Concerns/issues:   Left voice message 

## 2022-04-21 ENCOUNTER — Ambulatory Visit: Payer: BC Managed Care – PPO

## 2022-05-01 ENCOUNTER — Encounter: Payer: Self-pay | Admitting: Internal Medicine

## 2022-05-01 MED ORDER — ESCITALOPRAM OXALATE 20 MG PO TABS: 10.0000 mg | ORAL_TABLET | Freq: Every day | ORAL | 0 refills | Status: DC

## 2022-05-06 NOTE — Discharge Summary (Signed)
Physician Discharge Summary  Frederick Fox IWP:[id number] DOB: 1965/11/22 DOA: 03/31/2022  PCP: Binnie Rail, MD  Admit date: 03/31/2022 Discharge date: 05/06/2022  Recommendations for Outpatient Follow-up:   (include homehealth, outpatient follow-up instructions, specific recommendations for PCP to follow-up on, etc.)   Follow-up Information     Kaylen Nghiem, Arta Bruce, MD. Go on 04/22/2022.   Specialty: General Surgery Why: Please arrive 15 minutes prior to your 9:10am appointment. Contact information: [address] [place] [address] [phone]         Veleria Barnhardt, Arta Bruce, MD Follow up on 05/15/2022.   Specialty: General Surgery Why: Please arrive 15 minutes prior to your 9:20am appointment. Contact information: [place] [address] [phone]                Discharge Diagnoses:  Principal Problem:   Class II obesity   Surgical Procedure: Roux-en-Y gastric bypass, upper endoscopy  Discharge Condition: Good Disposition: Home  Diet recommendation: Postoperative sleeve gastrectomy diet (liquids only)  Filed Weights   03/31/22 0550  Weight: 105.5 kg     Hospital Course:  The patient was admitted after undergoing Roux-en-Y gastric bypass. POD 0 he ambulated well. POD 1 he was started on the water diet protocol and tolerated 300 ml in the first shift. Once meeting the water amount he was advanced to bariatric protein shakes which they tolerated and were discharged home POD 1.  Treatments: surgery: Roux-en-Y gastric bypass  Discharge Instructions  Discharge Instructions     Ambulate hourly while awake   Complete by: As directed    Call MD for:  difficulty breathing, headache or visual disturbances   Complete by: As directed    Call MD for:  persistant dizziness or light-headedness   Complete by: As directed    Call MD for:  persistant nausea and vomiting   Complete by: As directed    Call MD for:   redness, tenderness, or signs of infection (pain, swelling, redness, odor or green/yellow discharge around incision site)   Complete by: As directed    Call MD for:  severe uncontrolled pain   Complete by: As directed    Call MD for:  temperature >101 F   Complete by: As directed    Diet bariatric full liquid   Complete by: As directed    Discharge wound care:   Complete by: As directed    Remove Bandaids tomorrow, ok to shower tomorrow. Steristrips may fall off in 1-3 weeks.   Incentive spirometry   Complete by: As directed    Perform hourly while awake      Allergies as of 04/01/2022       Reactions   Lisinopril    cough   Crestor [rosuvastatin Calcium]    Fingers joint pain   Wellbutrin [bupropion]    nightmares        Medication List     STOP taking these medications    famotidine 20 MG tablet Commonly known as: PEPCID       TAKE these medications    albuterol 108 (90 Base) MCG/ACT inhaler Commonly known as: ProAir HFA Inhale 2 puffs into the lungs every 6 (six) hours as needed for wheezing or shortness of breath.   allopurinol 300 MG tablet Commonly known as: ZYLOPRIM Take 1 tablet by mouth once daily   amLODipine 10 MG tablet Commonly known as: NORVASC Take 1 tablet by mouth once daily Notes to patient: Monitor Blood  Pressure Daily and keep a log for primary care physician.  You may need to make changes to your medications with rapid weight loss.     budesonide-formoterol 80-4.5 MCG/ACT inhaler Commonly known as: SYMBICORT Inhale 2 puffs into the lungs 2 (two) times daily.   clomiPHENE 50 MG tablet Commonly known as: CLOMID Take 50 mg by mouth daily.   Farxiga 10 MG Tabs tablet Generic drug: dapagliflozin propanediol Take 10 mg by mouth daily. Notes to patient: Monitor Blood Sugar Frequently and keep a log for primary care physician, you may need to adjust medication dosage with rapid weight loss.     KLS Aller-Tec 10 MG tablet Generic drug:  cetirizine Take 10 mg by mouth at bedtime.   losartan 100 MG tablet Commonly known as: COZAAR Take 1 tablet by mouth once daily Notes to patient: Monitor Blood Pressure Daily and keep a log for primary care physician.  You may need to make changes to your medications with rapid weight loss.     MULTIVITAMIN ADULT PO Take 1 tablet by mouth daily.   nebivolol 5 MG tablet Commonly known as: BYSTOLIC Take 1 tablet by mouth once daily Notes to patient: Monitor Blood Pressure Daily and keep a log for primary care physician.  You may need to make changes to your medications with rapid weight loss.     ondansetron 4 MG disintegrating tablet Commonly known as: ZOFRAN-ODT Dissolve 1 tablet (4 mg total) by mouth every 6 (six) hours as needed for nausea or vomiting.   pantoprazole 40 MG tablet Commonly known as: PROTONIX Take 1 tablet (40 mg total) by mouth daily.   pravastatin 10 MG tablet Commonly known as: PRAVACHOL Take 1 tablet by mouth once daily   Turmeric 500 MG Caps Take 500 mg by mouth daily.       ASK your doctor about these medications    acetaminophen 500 MG tablet Commonly known as: TYLENOL Take 2 tablets (1,000 mg total) by mouth every 8 (eight) hours for 5 days. Ask about: Should I take this medication?               Discharge Care Instructions  (From admission, onward)           Start     Ordered   04/01/22 0000  Discharge wound care:       Comments: Remove Bandaids tomorrow, ok to shower tomorrow. Steristrips may fall off in 1-3 weeks.   04/01/22 0846            Follow-up Information     Desha Bitner, Arta Bruce, MD. Go on 04/22/2022.   Specialty: General Surgery Why: Please arrive 15 minutes prior to your 9:10am appointment. Contact information: [address] [place] [address] [phone]         Harvest Deist, Arta Bruce, MD Follow up on 05/15/2022.   Specialty: General Surgery Why: Please arrive 15 minutes prior to your  9:20am appointment. Contact information: [address] [place] [address] [phone]                  The results of significant diagnostics from this hospitalization (including imaging, microbiology, ancillary and laboratory) are listed below for reference.    Significant Diagnostic Studies: No results found.  Labs: Basic Metabolic Panel: No results for input(s): "NA", "K", "CL", "CO2", "GLUCOSE", "BUN", "CREATININE", "CALCIUM", "MG", "PHOS" in the last 168 hours. Liver Function Tests: No results for input(s): "AST", "ALT", "ALKPHOS", "BILITOT", "PROT", "  ALBUMIN" in the last 168 hours.  CBC: No results for input(s): "WBC", "NEUTROABS", "HGB", "HCT", "MCV", "PLT" in the last 168 hours.  CBG: No results for input(s): "GLUCAP" in the last 168 hours.  Principal Problem:   Class II obesity   VTE plan: no chemical prophylaxis recommended ([email or web address])  Time coordinating discharge: 15 min

## 2022-05-13 ENCOUNTER — Encounter: Payer: Self-pay | Admitting: Internal Medicine

## 2022-05-14 MED ORDER — ALLOPURINOL 300 MG PO TABS
150.0000 mg | ORAL_TABLET | Freq: Every day | ORAL | 0 refills | Status: DC
Start: 2022-05-14 — End: 2022-12-09

## 2022-05-14 NOTE — Addendum Note (Signed)
Addended by: Binnie Rail on: 05/14/2022 07:49 AM   Modules accepted: Orders

## 2022-05-20 ENCOUNTER — Encounter (INDEPENDENT_AMBULATORY_CARE_PROVIDER_SITE_OTHER): Payer: Self-pay

## 2022-06-02 ENCOUNTER — Encounter: Payer: BC Managed Care – PPO | Attending: General Surgery | Admitting: Skilled Nursing Facility1

## 2022-06-02 ENCOUNTER — Encounter: Payer: Self-pay | Admitting: Skilled Nursing Facility1

## 2022-06-02 DIAGNOSIS — E1122 Type 2 diabetes mellitus with diabetic chronic kidney disease: Secondary | ICD-10-CM | POA: Insufficient documentation

## 2022-06-02 DIAGNOSIS — Z794 Long term (current) use of insulin: Secondary | ICD-10-CM | POA: Diagnosis present

## 2022-06-02 DIAGNOSIS — E669 Obesity, unspecified: Secondary | ICD-10-CM | POA: Insufficient documentation

## 2022-06-02 DIAGNOSIS — N184 Chronic kidney disease, stage 4 (severe): Secondary | ICD-10-CM | POA: Insufficient documentation

## 2022-06-02 NOTE — Progress Notes (Signed)
Bariatric Nutrition Follow-Up Visit Medical Nutrition Therapy    NUTRITION ASSESSMENT    Surgery date: 03/31/2022 Surgery type: RYGB  NUTRITION ASSESSMENT   Anthropometrics  Start weight at [facility]: 251.3 lbs (date: 09/03/2021)   Weight today: 200.1 pounds   Clinical  Medical hx: Depression, stage 3 kidney disease, sleep apnea, GERD, DM Medications: see list: vitamin D  Labs: hemoglobin 12.2, hematocrit 36.8, BUN 33, creatinine 3.09, EGFR 23 (advised pt to send his most recent renal labs to his nephrologist) Notable signs/symptoms: currently a cough; chronic reflux  Any previous deficiencies? Yes, vitamin d   Body Composition Scale 04/13/2022 06/02/2022  Current Body Weight 221.1 200.1  Total Body Fat % 27.9 24  Visceral Fat 19 15  Fat-Free Mass % 72.0 75.9   Total Body Water % 53.0 56.9  Muscle-Mass lbs 41.6 37.6  BMI 32.4 29.3  Body Fat Displacement           Torso  lbs 38.3 29.7         Left Leg  lbs 7.6 5.9         Right Leg  lbs 7.6 5.9         Left Arm  lbs 3.8 2.9         Right Arm   lbs 3.8 2.9   Lifestyle & Dietary Hx  Pt states he is struggling to tolerate animal proteins.  Pt states he no longer urinate in the middle of the night.  Pt states he doe snot check his bloods sugar.  Pt states nothing is tasting good.  Pt states he works part time at the [employer]  Estimated daily fluid intake: 80-100 oz Estimated daily protein intake: 60 g Supplements: multi and extra vitamin D Current average weekly physical activity: walking will start at the gym or yoga   24-Hr Dietary Recall First Meal: chicken Snack:   Second Meal: yogurt and cheese stick or bone broth Snack:   Third Meal: chicken or pork chop or lentil soup Snack:  Beverages: water, vitamin water, no sugar tea  Post-Op Goals/ Signs/ Symptoms Using straws: no Drinking while eating: no Chewing/swallowing difficulties: no Changes in vision: no Changes to mood/headaches: no Hair loss/changes  to skin/nails: no Difficulty focusing/concentrating: no Sweating: no Limb weakness: no Dizziness/lightheadedness: no Palpitations: no  Carbonated/caffeinated beverages: no N/V/D/C/Gas: no Abdominal pain: no Dumping syndrome: no    NUTRITION DIAGNOSIS  Overweight/obesity (-3.3) related to past poor dietary habits and physical inactivity as evidenced by completed bariatric surgery and following dietary guidelines for continued weight loss and healthy nutrition status.     NUTRITION INTERVENTION Nutrition counseling (C-1) and education (E-2) to facilitate bariatric surgery goals, including: The importance of consuming adequate calories as well as certain nutrients daily due to the body's need for essential vitamins, minerals, and fats The importance of daily physical activity and to reach a goal of at least 150 minutes of moderate to vigorous physical activity weekly (or as directed by their physician) due to benefits such as increased musculature and improved lab values The importance of intuitive eating specifically learning hunger-satiety cues and understanding the importance of learning a new body: The importance of mindful eating to avoid grazing behaviors  Encouraged patient to honor their body's internal hunger and fullness cues.  Throughout the day, check in mentally and rate hunger. Stop eating when satisfied not full regardless of how much food is left on the plate.  Get more if still hungry 20-30 minutes later.  The key is  to honor satisfaction so throughout the meal, rate fullness factor and stop when comfortably satisfied not physically full. The key is to honor hunger and fullness without any feelings of guilt or shame.  Pay attention to what the internal cues are, rather than any external factors. This will enhance the confidence you have in listening to your own body and following those internal cues enabling you to increase how often you eat when you are hungry not out of  appetite and stop when you are satisfied not full.  Encouraged pt to continue to eat balanced meals inclusive of non starchy vegetables 2 times a day 7 days a week Encouraged pt to choose lean protein sources: limiting beef, pork, sausage, hotdogs, and lunch meat Encourage pt to choose healthy fats such as plant based limiting animal fats Encouraged pt to continue to drink a minium 64 fluid ounces with half being plain water to satisfy proper hydration  Focus on plant based proteins   Goals: -cut fluid back to a max of 80 ounces to allow room for foods -try edamame in your salad  Handouts Provided Include  Bariatric MyPlate  Learning Style & Readiness for Change Teaching method utilized: Visual & Auditory  Demonstrated degree of understanding via: Teach Back  Readiness Level: Action Barriers to learning/adherence to lifestyle change: food aversions   RD's Notes for Next Visit Assess adherence to pt chosen goals     MONITORING & EVALUATION Dietary intake, weekly physical activity, body weight  Next Steps Patient is to follow-up in 6 weeks

## 2022-07-05 ENCOUNTER — Encounter: Payer: Self-pay | Admitting: Internal Medicine

## 2022-07-05 NOTE — Progress Notes (Signed)
Subjective:    Patient ID: Frederick Fox, male    DOB: October 11, 1966, 56 y.o.   MRN: [id number]      HPI Frederick Fox is here for  Chief Complaint  Patient presents with   Burning sensation arm and leg   We will also do his routine follow-up now and move back his scheduled appointment for January.  Burning sensation in left arm and leg -he noticed this about 3 weeks ago and it has been persistent which is why he wanted to have it evaluated.  He denies any obvious cause-no injury and no specific activity caused it.  He has a burning sensation, sensitive feeling on the skin - left gluteal and proximal posterior - lateral leg and on his right lateral-posterior upper arm.  He feels it intermittently -more so when he puts pressure on the area is when he feels it.  He denies feeling it when he is walking or standing. Sometimes painful transient jolt like pain.    He denies any back pain, neck pain.  He is not having any other concerning symptoms he just thought it was unusual to have this pain and was unsure why.  He is otherwise doing well.  He is taking his medications as prescribed.  He sometimes has a hard time getting all the protein that he needs to eat in 1 day.  Some of the foods still do not taste right and he is unsure if it can continue or if that will go away.  He does have an appointment with a nutritionist to help fine-tune his diet.  Medications and allergies reviewed with patient and updated if appropriate.  Current Outpatient Medications on File Prior to Visit  Medication Sig Dispense Refill   albuterol (PROAIR HFA) 108 (90 Base) MCG/ACT inhaler Inhale 2 puffs into the lungs every 6 (six) hours as needed for wheezing or shortness of breath. 1 each 5   allopurinol (ZYLOPRIM) 300 MG tablet Take 0.5 tablets (150 mg total) by mouth daily. 45 tablet 0   amLODipine (NORVASC) 10 MG tablet Take 1 tablet by mouth once daily 90 tablet 1   budesonide-formoterol (SYMBICORT) 80-4.5 MCG/ACT  inhaler Inhale 2 puffs into the lungs 2 (two) times daily. 1 each 5   cetirizine (KLS ALLER-TEC) 10 MG tablet Take 10 mg by mouth at bedtime.     clomiPHENE (CLOMID) 50 MG tablet Take 50 mg by mouth daily.     escitalopram (LEXAPRO) 20 MG tablet Take 0.5 tablets (10 mg total) by mouth daily. 45 tablet 0   FARXIGA 10 MG TABS tablet Take 10 mg by mouth daily.     losartan (COZAAR) 100 MG tablet Take 1 tablet by mouth once daily 90 tablet 1   Multiple Vitamin (MULTIVITAMIN ADULT PO) Take 1 tablet by mouth daily.     nebivolol (BYSTOLIC) 5 MG tablet Take 1 tablet by mouth once daily 90 tablet 0   ondansetron (ZOFRAN-ODT) 4 MG disintegrating tablet Dissolve 1 tablet (4 mg total) by mouth every 6 (six) hours as needed for nausea or vomiting. 20 tablet 0   pantoprazole (PROTONIX) 40 MG tablet Take 1 tablet (40 mg total) by mouth daily. 60 tablet 0   pravastatin (PRAVACHOL) 10 MG tablet Take 1 tablet by mouth once daily 90 tablet 1   Turmeric 500 MG CAPS Take 500 mg by mouth daily.     No current facility-administered medications on file prior to visit.    Review of Systems  Constitutional:  Negative for fever.  Respiratory:  Positive for wheezing (occ - uses inhaler). Negative for cough and shortness of breath.   Cardiovascular:  Negative for chest pain, palpitations and leg swelling.  Gastrointestinal:  Negative for abdominal pain and nausea.       No gerd  Musculoskeletal:  Negative for back pain and neck pain.  Neurological:  Positive for dizziness and numbness. Negative for weakness, light-headedness and headaches.       Objective:   Vitals:   07/06/22 0829  BP: 134/76  Pulse: 70  Temp: 98.6 F (37 C)  SpO2: 98%   BP Readings from Last 3 Encounters:  07/06/22 134/76  04/07/22 128/76  04/01/22 (!) 168/111   Wt Readings from Last 3 Encounters:  07/06/22 194 lb (88 kg)  06/02/22 200 lb 1.6 oz (90.8 kg)  04/13/22 221 lb 1.6 oz (100.3 kg)   Body mass index is 28.65 kg/m.     Physical Exam Constitutional:      General: He is not in acute distress.    Appearance: Normal appearance. He is not ill-appearing.  HENT:     Head: Normocephalic and atraumatic.  Cardiovascular:     Rate and Rhythm: Normal rate and regular rhythm.     Heart sounds: No murmur heard. Pulmonary:     Effort: Pulmonary effort is normal. No respiratory distress.     Breath sounds: No wheezing or rales.  Musculoskeletal:     Right lower leg: No edema.     Left lower leg: No edema.  Skin:    General: Skin is warm and dry.  Neurological:     Mental Status: He is alert.     Cranial Nerves: No cranial nerve deficit.     Sensory: Sensory deficit (slight change in skin sensation in right upper posterior lef arm, left upper, posterior leg) present.     Motor: No weakness.            Assessment & Plan:      See Problem List for Assessment and Plan of chronic medical problems.

## 2022-07-06 ENCOUNTER — Encounter: Payer: Self-pay | Admitting: Internal Medicine

## 2022-07-06 ENCOUNTER — Ambulatory Visit: Payer: BC Managed Care – PPO | Admitting: Internal Medicine

## 2022-07-06 VITALS — BP 134/76 | HR 70 | Temp 98.6°F | Ht 69.0 in | Wt 194.0 lb

## 2022-07-06 DIAGNOSIS — N184 Chronic kidney disease, stage 4 (severe): Secondary | ICD-10-CM | POA: Diagnosis not present

## 2022-07-06 DIAGNOSIS — J4540 Moderate persistent asthma, uncomplicated: Secondary | ICD-10-CM

## 2022-07-06 DIAGNOSIS — I10 Essential (primary) hypertension: Secondary | ICD-10-CM

## 2022-07-06 DIAGNOSIS — E785 Hyperlipidemia, unspecified: Secondary | ICD-10-CM

## 2022-07-06 DIAGNOSIS — Z23 Encounter for immunization: Secondary | ICD-10-CM

## 2022-07-06 DIAGNOSIS — M1A9XX0 Chronic gout, unspecified, without tophus (tophi): Secondary | ICD-10-CM

## 2022-07-06 DIAGNOSIS — F3289 Other specified depressive episodes: Secondary | ICD-10-CM

## 2022-07-06 DIAGNOSIS — E1122 Type 2 diabetes mellitus with diabetic chronic kidney disease: Secondary | ICD-10-CM | POA: Diagnosis not present

## 2022-07-06 DIAGNOSIS — Z794 Long term (current) use of insulin: Secondary | ICD-10-CM | POA: Diagnosis not present

## 2022-07-06 DIAGNOSIS — R209 Unspecified disturbances of skin sensation: Secondary | ICD-10-CM

## 2022-07-06 DIAGNOSIS — E1169 Type 2 diabetes mellitus with other specified complication: Secondary | ICD-10-CM | POA: Diagnosis not present

## 2022-07-06 LAB — COMPREHENSIVE METABOLIC PANEL
ALT: 10 U/L (ref 0–53)
AST: 13 U/L (ref 0–37)
Albumin: 3.6 g/dL (ref 3.5–5.2)
Alkaline Phosphatase: 63 U/L (ref 39–117)
BUN: 29 mg/dL — ABNORMAL HIGH (ref 6–23)
CO2: 28 mEq/L (ref 19–32)
Calcium: 8.9 mg/dL (ref 8.4–10.5)
Chloride: 108 mEq/L (ref 96–112)
Creatinine, Ser: 2.52 mg/dL — ABNORMAL HIGH (ref 0.40–1.50)
GFR: 28.04 mL/min — ABNORMAL LOW (ref >60.00)
Glucose, Bld: 123 mg/dL — ABNORMAL HIGH (ref 70–99)
Potassium: 4 mEq/L (ref 3.5–5.1)
Sodium: 143 mEq/L (ref 135–145)
Total Bilirubin: 0.3 mg/dL (ref 0.2–1.2)
Total Protein: 6.3 g/dL (ref 6.0–8.3)

## 2022-07-06 LAB — LIPID PANEL
Cholesterol: 144 mg/dL (ref 0–200)
HDL: 42.3 mg/dL (ref >39.00)
LDL Cholesterol: 66 mg/dL (ref 0–99)
NonHDL: 101.72
Total CHOL/HDL Ratio: 3
Triglycerides: 181 mg/dL — ABNORMAL HIGH (ref 0.0–149.0)
VLDL: 36.2 mg/dL (ref 0.0–40.0)

## 2022-07-06 LAB — HEMOGLOBIN A1C: Hgb A1c MFr Bld: 6.3 % (ref 4.6–6.5)

## 2022-07-06 LAB — CBC WITH DIFFERENTIAL/PLATELET
Basophils Absolute: 0.1 10*3/uL (ref 0.0–0.1)
Basophils Relative: 0.7 % (ref 0.0–3.0)
Eosinophils Absolute: 0.2 10*3/uL (ref 0.0–0.7)
Eosinophils Relative: 2.3 % (ref 0.0–5.0)
HCT: 36.3 % — ABNORMAL LOW (ref 39.0–52.0)
Hemoglobin: 12.3 g/dL — ABNORMAL LOW (ref 13.0–17.0)
Lymphocytes Relative: 20 % (ref 12.0–46.0)
Lymphs Abs: 1.7 10*3/uL (ref 0.7–4.0)
MCHC: 34 g/dL (ref 30.0–36.0)
MCV: 85.5 fl (ref 78.0–100.0)
Monocytes Absolute: 0.5 10*3/uL (ref 0.1–1.0)
Monocytes Relative: 6.1 % (ref 3.0–12.0)
Neutro Abs: 6.1 10*3/uL (ref 1.4–7.7)
Neutrophils Relative %: 70.9 % (ref 43.0–77.0)
Platelets: 202 10*3/uL (ref 150.0–400.0)
RBC: 4.24 Mil/uL (ref 4.22–5.81)
RDW: 14.8 % (ref 11.5–15.5)
WBC: 8.6 10*3/uL (ref 4.0–10.5)

## 2022-07-06 LAB — URIC ACID: Uric Acid, Serum: 5.8 mg/dL (ref 4.0–7.8)

## 2022-07-06 LAB — VITAMIN D 25 HYDROXY (VIT D DEFICIENCY, FRACTURES): VITD: 38.31 ng/mL (ref 30.00–100.00)

## 2022-07-06 NOTE — Patient Instructions (Addendum)
     Flu immunization administered today.     Blood work was ordered.     Medications changes include :   none     Return in about 6 months (around 01/04/2023) for  Physical Exam, cancel january appointment.

## 2022-07-06 NOTE — Assessment & Plan Note (Signed)
Chronic Mild, persistent Controlled Continue Symbicort 2 puffs twice daily and albuterol inhaler as needed

## 2022-07-06 NOTE — Assessment & Plan Note (Signed)
Chronic  Lab Results  Component Value Date   HGBA1C 11.1 (H) 03/26/2022   Sugars well controlled at home and since his last reading he has had surgery and has lost weight and is eating much better Testing sugars 1 times a day Check A1c, urine microalbumin today Continue Farxiga 10 mg daily Stressed regular exercise, diabetic diet

## 2022-07-06 NOTE — Assessment & Plan Note (Signed)
Acute Started 3 weeks ago Right upper lateral posterior arm and left gluteal-posterior-lateral leg approximately only Difficult to say why he is having the sensation-comes and goes especially when he is applying pressure to the area At this point he will just monitor If this persists or worsens may need to reimage his neck Denies neck pain, headaches or back pain No change in gait or strength

## 2022-07-06 NOTE — Assessment & Plan Note (Signed)
Chronic Controlled, Stable Continue Lexapro 10 mg daily 

## 2022-07-06 NOTE — Assessment & Plan Note (Signed)
Chronic Regular exercise and healthy diet encouraged Check lipid panel  Continue pravastatin 10 mg daily 

## 2022-07-06 NOTE — Assessment & Plan Note (Signed)
Chronic Blood pressure well controlled CMP Continue amlodipine 10 mg daily, losartan 160 mg daily, Bystolic 5 mg daily

## 2022-07-06 NOTE — Assessment & Plan Note (Signed)
Chronic Denies gout flares We did decrease the allopurinol 250 mg daily and he is doing well with that Check uric acid level

## 2022-07-06 NOTE — Assessment & Plan Note (Signed)
Chronic Following with Dr. Hollie Salk On Farxiga 10 mg daily Blood pressures well controlled Check A1c today to confirm sugars are well controlled

## 2022-07-12 ENCOUNTER — Other Ambulatory Visit: Payer: Self-pay | Admitting: Internal Medicine

## 2022-08-04 ENCOUNTER — Encounter: Payer: Self-pay | Admitting: Skilled Nursing Facility1

## 2022-08-04 ENCOUNTER — Encounter: Payer: BC Managed Care – PPO | Attending: Internal Medicine | Admitting: Skilled Nursing Facility1

## 2022-08-04 DIAGNOSIS — E1122 Type 2 diabetes mellitus with diabetic chronic kidney disease: Secondary | ICD-10-CM | POA: Diagnosis present

## 2022-08-04 DIAGNOSIS — N184 Chronic kidney disease, stage 4 (severe): Secondary | ICD-10-CM | POA: Diagnosis present

## 2022-08-04 DIAGNOSIS — Z794 Long term (current) use of insulin: Secondary | ICD-10-CM | POA: Insufficient documentation

## 2022-08-04 DIAGNOSIS — E669 Obesity, unspecified: Secondary | ICD-10-CM

## 2022-08-04 NOTE — Progress Notes (Signed)
Bariatric Nutrition Follow-Up Visit Medical Nutrition Therapy    NUTRITION ASSESSMENT    Surgery date: 03/31/2022 Surgery type: RYGB  NUTRITION ASSESSMENT   Anthropometrics  Start weight at [facility]: 251.3 lbs (date: 09/03/2021)   Weight today: 197.6 pounds   Clinical  Medical hx: Depression, stage 3 kidney disease, sleep apnea, GERD, DM Medications: see list: vitamin D  Labs: hemoglobin 12.2, hematocrit 36.8, BUN 33, creatinine 3.09, EGFR 23 (advised pt to send his most recent renal labs to his nephrologist) Notable signs/symptoms: currently a cough; chronic reflux  Any previous deficiencies? Yes, vitamin d   Body Composition Scale 04/13/2022 06/02/2022 08/04/2022  Current Body Weight 221.1 200.1 197.6  Total Body Fat % 27.9 24 23.6  Visceral Fat _0 Fat-Free Mass % 72.0 75.9 76.3   Total Body Water % 53.0 56.9 57.3  Muscle-Mass lbs 41.6 37.6 37.1  BMI 32.4 29.3 28.9  Body Fat Displacement            Torso  lbs 38.3 29.7 28.8         Left Leg  lbs 7.6 5.9 5.7         Right Leg  lbs 7.6 5.9 5.7         Left Arm  lbs 3.8 2.9 2.8         Right Arm   lbs 3.8 2.9 2.8   Lifestyle & Dietary Hx  Pt is doing great!   Estimated daily fluid intake: 80-100 oz Estimated daily protein intake: 60 g Supplements: multi and extra vitamin D Current average weekly physical activity: walking 30 minutes daily, some stuff in the gym at the apartment   24-Hr Dietary Recall First Meal: 2 egg + deli meat + cheese or special k protein cereal or homemade smoothie with fruit veg and protein powder and yogurt  Snack:  cheese stick  Second Meal: high protein salad + cheese + lettuce + tomato + cucumber + carrots + cabbage + chicken Snack:  fruit Third Meal: high protein lean cuisine or soup or chili Snack:  Beverages: water, vitamin water, no sugar tea, fairlife milk  Post-Op Goals/ Signs/ Symptoms Using straws: no Drinking while eating: no Chewing/swallowing difficulties:  no Changes in vision: no Changes to mood/headaches: no Hair loss/changes to skin/nails: no Difficulty focusing/concentrating: no Sweating: no Limb weakness: no Dizziness/lightheadedness: no Palpitations: no  Carbonated/caffeinated beverages: no N/V/D/C/Gas: no Abdominal pain: no Dumping syndrome: no    NUTRITION DIAGNOSIS  Overweight/obesity ([place]-3.3) related to past poor dietary habits and physical inactivity as evidenced by completed bariatric surgery and following dietary guidelines for continued weight loss and healthy nutrition status.     NUTRITION INTERVENTION: continued Nutrition counseling (C-1) and education (E-2) to facilitate bariatric surgery goals, including: The importance of consuming adequate calories as well as certain nutrients daily due to the body's need for essential vitamins, minerals, and fats The importance of daily physical activity and to reach a goal of at least 150 minutes of moderate to vigorous physical activity weekly (or as directed by their physician) due to benefits such as increased musculature and improved lab values The importance of intuitive eating specifically learning hunger-satiety cues and understanding the importance of learning a new body: The importance of mindful eating to avoid grazing behaviors  Encouraged patient to honor their body's internal hunger and fullness cues.  Throughout the day, check in mentally and rate hunger. Stop eating when satisfied not full regardless of how much food is left on the plate.  Get more if still hungry 20-30 minutes later.  The key is to honor satisfaction so throughout the meal, rate fullness factor and stop when comfortably satisfied not physically full. The key is to honor hunger and fullness without any feelings of guilt or shame.  Pay attention to what the internal cues are, rather than any external factors. This will enhance the confidence you have in listening to your own body and following those internal  cues enabling you to increase how often you eat when you are hungry not out of appetite and stop when you are satisfied not full.  Encouraged pt to continue to eat balanced meals inclusive of non starchy vegetables 2 times a day 7 days a week Encouraged pt to choose lean protein sources: limiting beef, pork, sausage, hotdogs, and lunch meat Encourage pt to choose healthy fats such as plant based limiting animal fats Encouraged pt to continue to drink a minium 64 fluid ounces with half being plain water to satisfy proper hydration  Focus on plant based proteins     Handouts Provided Include  Bariatric MyPlate  Learning Style & Readiness for Change Teaching method utilized: Visual & Auditory  Demonstrated degree of understanding via: Teach Back  Readiness Level: Action Barriers to learning/adherence to lifestyle change: none identified   RD's Notes for Next Visit Assess adherence to pt chosen goals     MONITORING & EVALUATION Dietary intake, weekly physical activity, body weight  Next Steps Patient is to follow-up

## 2022-08-27 ENCOUNTER — Encounter: Payer: Self-pay | Admitting: Internal Medicine

## 2022-08-27 MED ORDER — ESCITALOPRAM OXALATE 20 MG PO TABS: 20.0000 mg | ORAL_TABLET | Freq: Every day | ORAL | 2 refills | Status: DC

## 2022-10-21 ENCOUNTER — Ambulatory Visit: Payer: BC Managed Care – PPO | Admitting: Internal Medicine

## 2022-12-09 ENCOUNTER — Other Ambulatory Visit: Payer: Self-pay | Admitting: Internal Medicine

## 2023-01-03 NOTE — Patient Instructions (Addendum)
      Blood work was ordered.   The lab is on the first floor.    Medications changes include :   none      Return in about 6 months (around 07/07/2023) for Physical Exam.

## 2023-01-04 ENCOUNTER — Ambulatory Visit: Payer: BC Managed Care – PPO | Admitting: Internal Medicine

## 2023-01-04 ENCOUNTER — Encounter: Payer: Self-pay | Admitting: Internal Medicine

## 2023-01-04 VITALS — BP 120/72 | HR 55 | Temp 98.5°F | Ht 69.0 in | Wt 199.0 lb

## 2023-01-04 DIAGNOSIS — L821 Other seborrheic keratosis: Secondary | ICD-10-CM | POA: Insufficient documentation

## 2023-01-04 DIAGNOSIS — D224 Melanocytic nevi of scalp and neck: Secondary | ICD-10-CM | POA: Insufficient documentation

## 2023-01-04 DIAGNOSIS — N184 Chronic kidney disease, stage 4 (severe): Secondary | ICD-10-CM | POA: Diagnosis not present

## 2023-01-04 DIAGNOSIS — Z794 Long term (current) use of insulin: Secondary | ICD-10-CM

## 2023-01-04 DIAGNOSIS — E1122 Type 2 diabetes mellitus with diabetic chronic kidney disease: Secondary | ICD-10-CM

## 2023-01-04 DIAGNOSIS — F3289 Other specified depressive episodes: Secondary | ICD-10-CM

## 2023-01-04 DIAGNOSIS — E1169 Type 2 diabetes mellitus with other specified complication: Secondary | ICD-10-CM | POA: Diagnosis not present

## 2023-01-04 DIAGNOSIS — I10 Essential (primary) hypertension: Secondary | ICD-10-CM

## 2023-01-04 DIAGNOSIS — L814 Other melanin hyperpigmentation: Secondary | ICD-10-CM | POA: Insufficient documentation

## 2023-01-04 DIAGNOSIS — E785 Hyperlipidemia, unspecified: Secondary | ICD-10-CM

## 2023-01-04 DIAGNOSIS — L570 Actinic keratosis: Secondary | ICD-10-CM | POA: Insufficient documentation

## 2023-01-04 DIAGNOSIS — D239 Other benign neoplasm of skin, unspecified: Secondary | ICD-10-CM | POA: Insufficient documentation

## 2023-01-04 DIAGNOSIS — D485 Neoplasm of uncertain behavior of skin: Secondary | ICD-10-CM | POA: Insufficient documentation

## 2023-01-04 DIAGNOSIS — D225 Melanocytic nevi of trunk: Secondary | ICD-10-CM | POA: Insufficient documentation

## 2023-01-04 LAB — COMPREHENSIVE METABOLIC PANEL
ALT: 10 U/L (ref 0–53)
AST: 13 U/L (ref 0–37)
Albumin: 4.1 g/dL (ref 3.5–5.2)
Alkaline Phosphatase: 80 U/L (ref 39–117)
BUN: 46 mg/dL — ABNORMAL HIGH (ref 6–23)
CO2: 24 mEq/L (ref 19–32)
Calcium: 8.9 mg/dL (ref 8.4–10.5)
Chloride: 108 mEq/L (ref 96–112)
Creatinine, Ser: 2.43 mg/dL — ABNORMAL HIGH (ref 0.40–1.50)
GFR: 29.19 mL/min — ABNORMAL LOW (ref >60.00)
Glucose, Bld: 83 mg/dL (ref 70–99)
Potassium: 4.1 mEq/L (ref 3.5–5.1)
Sodium: 141 mEq/L (ref 135–145)
Total Bilirubin: 0.3 mg/dL (ref 0.2–1.2)
Total Protein: 6.7 g/dL (ref 6.0–8.3)

## 2023-01-04 LAB — CBC WITH DIFFERENTIAL/PLATELET
Basophils Absolute: 0.1 10*3/uL (ref 0.0–0.1)
Basophils Relative: 0.6 % (ref 0.0–3.0)
Eosinophils Absolute: 0.2 10*3/uL (ref 0.0–0.7)
Eosinophils Relative: 2 % (ref 0.0–5.0)
HCT: 44.6 % (ref 39.0–52.0)
Hemoglobin: 14.8 g/dL (ref 13.0–17.0)
Lymphocytes Relative: 19.9 % (ref 12.0–46.0)
Lymphs Abs: 2.1 10*3/uL (ref 0.7–4.0)
MCHC: 33.3 g/dL (ref 30.0–36.0)
MCV: 85.4 fl (ref 78.0–100.0)
Monocytes Absolute: 0.5 10*3/uL (ref 0.1–1.0)
Monocytes Relative: 4.9 % (ref 3.0–12.0)
Neutro Abs: 7.5 10*3/uL (ref 1.4–7.7)
Neutrophils Relative %: 72.6 % (ref 43.0–77.0)
Platelets: 212 10*3/uL (ref 150.0–400.0)
RBC: 5.22 Mil/uL (ref 4.22–5.81)
RDW: 14.8 % (ref 11.5–15.5)
WBC: 10.3 10*3/uL (ref 4.0–10.5)

## 2023-01-04 LAB — LIPID PANEL
Cholesterol: 185 mg/dL (ref 0–200)
HDL: 50.2 mg/dL (ref >39.00)
Total CHOL/HDL Ratio: 4
Triglycerides: 458 mg/dL — ABNORMAL HIGH (ref 0.0–149.0)

## 2023-01-04 LAB — LDL CHOLESTEROL, DIRECT: Direct LDL: 75 mg/dL

## 2023-01-04 LAB — HEMOGLOBIN A1C: Hgb A1c MFr Bld: 6.2 % (ref 4.6–6.5)

## 2023-01-04 MED ORDER — AMLODIPINE BESYLATE 10 MG PO TABS: 10.0000 mg | ORAL_TABLET | Freq: Every day | ORAL | 1 refills | Status: DC

## 2023-01-04 MED ORDER — ESCITALOPRAM OXALATE 20 MG PO TABS: 20.0000 mg | ORAL_TABLET | Freq: Every day | ORAL | 2 refills | Status: DC

## 2023-01-04 MED ORDER — PRAVASTATIN SODIUM 10 MG PO TABS
10.0000 mg | ORAL_TABLET | Freq: Every day | ORAL | 1 refills | Status: AC
Start: 2023-01-04 — End: ?

## 2023-01-04 MED ORDER — NEBIVOLOL HCL 5 MG PO TABS
5.0000 mg | ORAL_TABLET | Freq: Every day | ORAL | 2 refills | Status: DC
Start: 2023-01-04 — End: 2024-03-23

## 2023-01-04 MED ORDER — LOSARTAN POTASSIUM 100 MG PO TABS: 100.0000 mg | ORAL_TABLET | Freq: Every day | ORAL | 1 refills | Status: DC

## 2023-01-04 NOTE — Assessment & Plan Note (Signed)
Chronic Following with Dr. Hollie Salk Kidney function has been stable On [place] well-controlled Blood pressure well-controlled

## 2023-01-04 NOTE — Progress Notes (Signed)
Subjective:    Patient ID: Frederick Fox, male    DOB: 07-19-1966, 57 y.o.   MRN: [id number]     HPI Frederick Fox is here for follow up of his chronic medical problems.  Overall doing well.  May be getting a new job in [employer] area.  He is exercising regularly.  Doing well with medications.  Sugars have been controlled.  Medications and allergies reviewed with patient and updated if appropriate.  Current Outpatient Medications on File Prior to Visit  Medication Sig Dispense Refill   albuterol (PROAIR HFA) 108 (90 Base) MCG/ACT inhaler Inhale 2 puffs into the lungs every 6 (six) hours as needed for wheezing or shortness of breath. 1 each 5   allopurinol (ZYLOPRIM) 300 MG tablet Take 1 tablet by mouth once daily 90 tablet 0   amLODipine (NORVASC) 10 MG tablet Take 1 tablet by mouth once daily 90 tablet 1   budesonide-formoterol (SYMBICORT) 80-4.5 MCG/ACT inhaler Inhale 2 puffs into the lungs 2 (two) times daily. 1 each 5   cetirizine (KLS ALLER-TEC) 10 MG tablet Take 10 mg by mouth at bedtime.     clomiPHENE (CLOMID) 50 MG tablet Take 50 mg by mouth daily.     escitalopram (LEXAPRO) 20 MG tablet Take 1 tablet (20 mg total) by mouth daily. 90 tablet 2   FARXIGA 10 MG TABS tablet Take 10 mg by mouth daily.     losartan (COZAAR) 100 MG tablet Take 1 tablet by mouth once daily 90 tablet 1   Multiple Vitamin (MULTIVITAMIN ADULT PO) Take 1 tablet by mouth daily.     nebivolol (BYSTOLIC) 5 MG tablet Take 1 tablet by mouth once daily 90 tablet 2   ondansetron (ZOFRAN-ODT) 4 MG disintegrating tablet Dissolve 1 tablet (4 mg total) by mouth every 6 (six) hours as needed for nausea or vomiting. 20 tablet 0   pantoprazole (PROTONIX) 40 MG tablet Take 1 tablet (40 mg total) by mouth daily. 60 tablet 0   pravastatin (PRAVACHOL) 10 MG tablet Take 1 tablet by mouth once daily 90 tablet 1   Turmeric 500 MG CAPS Take 500 mg by mouth daily.     No current facility-administered medications on  file prior to visit.     Review of Systems  Constitutional:  Negative for fever.  Respiratory:  Negative for cough, shortness of breath and wheezing.   Cardiovascular:  Negative for chest pain, palpitations and leg swelling.  Neurological:  Negative for light-headedness and headaches.       Objective:   Vitals:   01/04/23 1505  BP: 120/72  Pulse: (!) 55  Temp: 98.5 F (36.9 C)  SpO2: 98%   BP Readings from Last 3 Encounters:  01/04/23 120/72  07/06/22 134/76  04/07/22 128/76   Wt Readings from Last 3 Encounters:  01/04/23 199 lb (90.3 kg)  08/04/22 197 lb 9.6 oz (89.6 kg)  07/06/22 194 lb (88 kg)   Body mass index is 29.39 kg/m.    Physical Exam Constitutional:      General: He is not in acute distress.    Appearance: Normal appearance. He is not ill-appearing.  HENT:     Head: Normocephalic and atraumatic.  Eyes:     Conjunctiva/sclera: Conjunctivae normal.  Cardiovascular:     Rate and Rhythm: Normal rate and regular rhythm.     Heart sounds: Normal heart sounds.  Pulmonary:     Effort: Pulmonary effort is normal. No respiratory distress.  Breath sounds: Normal breath sounds. No wheezing or rales.  Musculoskeletal:     Right lower leg: No edema.     Left lower leg: No edema.  Skin:    General: Skin is warm and dry.     Findings: No rash.  Neurological:     Mental Status: He is alert. Mental status is at baseline.  Psychiatric:        Mood and Affect: Mood normal.        Lab Results  Component Value Date   WBC 8.6 07/06/2022   HGB 12.3 (L) 07/06/2022   HCT 36.3 (L) 07/06/2022   PLT 202.0 07/06/2022   GLUCOSE 123 (H) 07/06/2022   CHOL 144 07/06/2022   TRIG 181.0 (H) 07/06/2022   HDL 42.30 07/06/2022   LDLDIRECT 82.0 01/19/2022   LDLCALC 66 07/06/2022   ALT 10 07/06/2022   AST 13 07/06/2022   NA 143 07/06/2022   K 4.0 07/06/2022   CL 108 07/06/2022   CREATININE 2.52 (H) 07/06/2022   BUN 29 (H) 07/06/2022   CO2 28 07/06/2022   TSH  1.060 08/06/2020   PSA 1.15 04/24/2013   HGBA1C 6.3 07/06/2022   MICROALBUR 117.0 (H) 01/19/2022     Assessment & Plan:    See Problem List for Assessment and Plan of chronic medical problems.

## 2023-01-04 NOTE — Assessment & Plan Note (Signed)
Chronic Controlled, Stable Continue Lexapro 10 mg daily 

## 2023-01-04 NOTE — Assessment & Plan Note (Signed)
Chronic Blood pressure well controlled CMP Continue amlodipine 10 mg daily, losartan 100 mg daily, Bystolic 5 mg daily 

## 2023-01-04 NOTE — Assessment & Plan Note (Signed)
Chronic   Lab Results  Component Value Date   HGBA1C 6.3 07/06/2022   Sugars have been well-controlled Check A1c Continue Farxiga 10 mg daily Continue regular exercise, diabetic diet

## 2023-01-04 NOTE — Assessment & Plan Note (Signed)
Chronic °Regular exercise and healthy diet encouraged °Check lipid panel  °Continue pravastatin 10 mg daily °

## 2023-01-05 LAB — TSH: TSH: 1.42 u[IU]/mL (ref 0.35–5.50)

## 2023-01-22 LAB — LAB REPORT - SCANNED
Albumin, Urine POC: 651.4
Creatinine, POC: 85.8 mg/dL
Microalb Creat Ratio: 759
Protein/Creatinine Ratio: 1138

## 2023-02-23 ENCOUNTER — Encounter: Payer: Self-pay | Admitting: Internal Medicine

## 2023-02-23 MED ORDER — POLYMYXIN B-TRIMETHOPRIM 10000-0.1 UNIT/ML-% OP SOLN: 1.0000 [drp] | OPHTHALMIC | 0 refills | Status: DC

## 2023-02-23 MED ORDER — MOMETASONE FURO-FORMOTEROL FUM 200-5 MCG/ACT IN AERO: 2.0000 | INHALATION_SPRAY | Freq: Two times a day (BID) | RESPIRATORY_TRACT | 5 refills | Status: AC

## 2023-02-23 NOTE — Addendum Note (Signed)
Addended by: Pincus Sanes on: 02/23/2023 12:59 PM   Modules accepted: Orders

## 2023-02-28 ENCOUNTER — Encounter: Payer: Self-pay | Admitting: Internal Medicine

## 2023-02-28 DIAGNOSIS — N529 Male erectile dysfunction, unspecified: Secondary | ICD-10-CM

## 2023-03-01 ENCOUNTER — Other Ambulatory Visit: Payer: Self-pay

## 2023-03-01 DIAGNOSIS — N529 Male erectile dysfunction, unspecified: Secondary | ICD-10-CM

## 2023-03-01 NOTE — Telephone Encounter (Signed)
Viagra is not on the med list pls advise.Marland KitchenRaechel Chute

## 2023-03-02 MED ORDER — SILDENAFIL CITRATE 100 MG PO TABS: 50.0000 mg | ORAL_TABLET | Freq: Every day | ORAL | 11 refills | Status: DC | PRN

## 2023-03-28 ENCOUNTER — Encounter: Payer: Self-pay | Admitting: Internal Medicine

## 2023-03-28 NOTE — Assessment & Plan Note (Signed)
Chronic Blood pressure well controlled CMP Continue amlodipine 10 mg daily, losartan 100 mg daily, Bystolic 5 mg daily 

## 2023-03-28 NOTE — Patient Instructions (Addendum)
Blood work was ordered.       Medications changes include :       A referral was ordered and someone will call you to schedule an appointment.     Return in about 6 months (around 09/28/2023) for follow up.   Health Maintenance, Male Adopting a healthy lifestyle and getting preventive care are important in promoting health and wellness. Ask your health care provider about: The right schedule for you to have regular tests and exams. Things you can do on your own to prevent diseases and keep yourself healthy. What should I know about diet, weight, and exercise? Eat a healthy diet  Eat a diet that includes plenty of vegetables, fruits, low-fat dairy products, and lean protein. Do not eat a lot of foods that are high in solid fats, added sugars, or sodium. Maintain a healthy weight Body mass index (BMI) is a measurement that can be used to identify possible weight problems. It estimates body fat based on height and weight. Your health care provider can help determine your BMI and help you achieve or maintain a healthy weight. Get regular exercise Get regular exercise. This is one of the most important things you can do for your health. Most adults should: Exercise for at least 150 minutes each week. The exercise should increase your heart rate and make you sweat (moderate-intensity exercise). Do strengthening exercises at least twice a week. This is in addition to the moderate-intensity exercise. Spend less time sitting. Even light physical activity can be beneficial. Watch cholesterol and blood lipids Have your blood tested for lipids and cholesterol at 56 years of age, then have this test every 5 years. You may need to have your cholesterol levels checked more often if: Your lipid or cholesterol levels are high. You are older than 56 years of age. You are at high risk for heart disease. What should I know about cancer screening? Many types of cancers can be detected  early and may often be prevented. Depending on your health history and family history, you may need to have cancer screening at various ages. This may include screening for: Colorectal cancer. Prostate cancer. Skin cancer. Lung cancer. What should I know about heart disease, diabetes, and high blood pressure? Blood pressure and heart disease High blood pressure causes heart disease and increases the risk of stroke. This is more likely to develop in people who have high blood pressure readings or are overweight. Talk with your health care provider about your target blood pressure readings. Have your blood pressure checked: Every 3-5 years if you are 60-19 years of age. Every year if you are 3 years old or older. If you are between the ages of 37 and 85 and are a current or former smoker, ask your health care provider if you should have a one-time screening for abdominal aortic aneurysm (AAA). Diabetes Have regular diabetes screenings. This checks your fasting blood sugar level. Have the screening done: Once every three years after age 28 if you are at a normal weight and have a low risk for diabetes. More often and at a younger age if you are overweight or have a high risk for diabetes. What should I know about preventing infection? Hepatitis B If you have a higher risk for hepatitis B, you should be screened for this virus. Talk with your health care provider to find out if you are at risk for hepatitis B infection. Hepatitis C Blood testing is recommended for:  Everyone born from [birth year] through 1964. Anyone with known risk factors for hepatitis C. Sexually transmitted infections (STIs) You should be screened each year for STIs, including gonorrhea and chlamydia, if: You are sexually active and are younger than 56 years of age. You are older than 56 years of age and your health care provider tells you that you are at risk for this type of infection. Your sexual activity has changed since  you were last screened, and you are at increased risk for chlamydia or gonorrhea. Ask your health care provider if you are at risk. Ask your health care provider about whether you are at high risk for HIV. Your health care provider may recommend a prescription medicine to help prevent HIV infection. If you choose to take medicine to prevent HIV, you should first get tested for HIV. You should then be tested every 3 months for as long as you are taking the medicine. Follow these instructions at home: Alcohol use Do not drink alcohol if your health care provider tells you not to drink. If you drink alcohol: Limit how much you have to 0-2 drinks a day. Know how much alcohol is in your drink. In the [place] one drink equals one 12 oz bottle of beer (355 mL), one 5 oz glass of wine (148 mL), or one 1 oz glass of hard liquor (44 mL). Lifestyle Do not use any products that contain nicotine or tobacco. These products include cigarettes, chewing tobacco, and vaping devices, such as e-cigarettes. If you need help quitting, ask your health care provider. Do not use street drugs. Do not share needles. Ask your health care provider for help if you need support or information about quitting drugs. General instructions Schedule regular health, dental, and eye exams. Stay current with your vaccines. Tell your health care provider if: You often feel depressed. You have ever been abused or do not feel safe at home. Summary Adopting a healthy lifestyle and getting preventive care are important in promoting health and wellness. Follow your health care provider's instructions about healthy diet, exercising, and getting tested or screened for diseases. Follow your health care provider's instructions on monitoring your cholesterol and blood pressure. This information is not intended to replace advice given to you by your health care provider. Make sure you discuss any questions you have with your health care  provider. Document Revised: 02/17/2021 Document Reviewed: 02/17/2021 Elsevier Patient Education  2024 [employer].

## 2023-03-28 NOTE — Progress Notes (Unsigned)
Subjective:    Patient ID: Frederick Fox, male    DOB: 1966-03-21, 57 y.o.   MRN: [id number]     HPI [place] is here for a physical exam and his chronic medical problems.  Last labs 3/25  Doing well-changes in plans with job-still moving  Medications and allergies reviewed with patient and updated if appropriate.  Current Outpatient Medications on File Prior to Visit  Medication Sig Dispense Refill   albuterol (PROAIR HFA) 108 (90 Base) MCG/ACT inhaler Inhale 2 puffs into the lungs every 6 (six) hours as needed for wheezing or shortness of breath. 1 each 5   allopurinol (ZYLOPRIM) 300 MG tablet Take 1 tablet by mouth once daily 90 tablet 0   amLODipine (NORVASC) 10 MG tablet Take 1 tablet (10 mg total) by mouth daily. 90 tablet 1   budesonide-formoterol (SYMBICORT) 80-4.5 MCG/ACT inhaler Inhale 2 puffs into the lungs 2 (two) times daily. 1 each 5   cetirizine (KLS ALLER-TEC) 10 MG tablet Take 10 mg by mouth at bedtime.     clomiPHENE (CLOMID) 50 MG tablet Take 50 mg by mouth daily.     escitalopram (LEXAPRO) 20 MG tablet Take 1 tablet (20 mg total) by mouth daily. 90 tablet 2   FARXIGA 10 MG TABS tablet Take 10 mg by mouth daily.     losartan (COZAAR) 100 MG tablet Take 1 tablet (100 mg total) by mouth daily. 90 tablet 1   mometasone-formoterol (DULERA) 200-5 MCG/ACT AERO Inhale 2 puffs into the lungs 2 (two) times daily. 13 g 5   Multiple Vitamin (MULTIVITAMIN ADULT PO) Take 1 tablet by mouth daily.     nebivolol (BYSTOLIC) 5 MG tablet Take 1 tablet (5 mg total) by mouth daily. 90 tablet 2   ondansetron (ZOFRAN-ODT) 4 MG disintegrating tablet Dissolve 1 tablet (4 mg total) by mouth every 6 (six) hours as needed for nausea or vomiting. 20 tablet 0   pantoprazole (PROTONIX) 40 MG tablet Take 1 tablet (40 mg total) by mouth daily. 60 tablet 0   pravastatin (PRAVACHOL) 10 MG tablet Take 1 tablet (10 mg total) by mouth daily. 90 tablet 1   sildenafil (VIAGRA) 100 MG tablet Take  0.5-1 tablets (50-100 mg total) by mouth daily as needed for erectile dysfunction. 10 tablet 11   Turmeric 500 MG CAPS Take 500 mg by mouth daily.     No current facility-administered medications on file prior to visit.    Review of Systems  Constitutional:  Negative for fever.  Eyes:  Negative for visual disturbance.  Respiratory:  Negative for cough, shortness of breath and wheezing.   Cardiovascular:  Negative for chest pain, palpitations and leg swelling.  Gastrointestinal:  Negative for abdominal pain, blood in stool, constipation and diarrhea.       No gerd  Genitourinary:  Negative for difficulty urinating and dysuria.  Musculoskeletal:  Negative for arthralgias and back pain.  Skin:  Negative for rash.  Neurological:  Negative for light-headedness and headaches.  Psychiatric/Behavioral:  Positive for dysphoric mood. The patient is nervous/anxious.        Objective:   Vitals:   03/29/23 1052  BP: (!) 150/88  Pulse: 80  Temp: 98.7 F (37.1 C)  SpO2: 97%   Filed Weights   03/29/23 1052  Weight: 193 lb 12.8 oz (87.9 kg)   Body mass index is 28.62 kg/m.  BP Readings from Last 3 Encounters:  03/29/23 (!) 150/88  01/04/23 120/72  07/06/22 134/76  Wt Readings from Last 3 Encounters:  03/29/23 193 lb 12.8 oz (87.9 kg)  01/04/23 199 lb (90.3 kg)  08/04/22 197 lb 9.6 oz (89.6 kg)      Physical Exam Constitutional: He appears well-developed and well-nourished. No distress.  HENT:  Head: Normocephalic and atraumatic.  Right Ear: External ear normal.  Left Ear: External ear normal.  Normal ear canals and TM b/l  Mouth/Throat: Oropharynx is clear and moist. Eyes: Conjunctivae and EOM are normal.  Neck: Neck supple. No tracheal deviation present. No thyromegaly present.  No carotid bruit  Cardiovascular: Normal rate, regular rhythm, normal heart sounds and intact distal pulses.   No murmur heard.  No lower extremity edema. Pulmonary/Chest: Effort normal and  breath sounds normal. No respiratory distress. He has no wheezes. He has no rales.  Abdominal: Soft. He exhibits no distension. There is no tenderness.  Genitourinary: deferred  Lymphadenopathy:   He has no cervical adenopathy.  Skin: Skin is warm and dry. He is not diaphoretic.  Psychiatric: He has a normal mood and affect. His behavior is normal.         Assessment & Plan:   Physical exam: Screening blood work  ordered Exercise   regular  Weight  has lost a lot of weight Substance abuse   none   Reviewed recommended immunizations.   Health Maintenance  Topic Date Due   FOOT EXAM  Never done   OPHTHALMOLOGY EXAM  Never done   COVID-19 Vaccine (4 - 2023-24 season) 04/13/2023 (Originally 06/12/2022)   INFLUENZA VACCINE  05/13/2023   HEMOGLOBIN A1C  07/07/2023   Diabetic kidney evaluation - eGFR measurement  01/04/2024   Diabetic kidney evaluation - Urine ACR  01/22/2024   Colonoscopy  07/09/2024   DTaP/Tdap/Td (2 - Td or Tdap) 09/13/2029   Hepatitis C Screening  Completed   HIV Screening  Completed   Zoster Vaccines- Shingrix  Completed   HPV VACCINES  Aged Out     See Problem List for Assessment and Plan of chronic medical problems.

## 2023-03-29 ENCOUNTER — Ambulatory Visit: Payer: BC Managed Care – PPO | Admitting: Internal Medicine

## 2023-03-29 VITALS — BP 150/88 | HR 80 | Temp 98.7°F | Ht 69.0 in | Wt 193.8 lb

## 2023-03-29 DIAGNOSIS — Z0000 Encounter for general adult medical examination without abnormal findings: Secondary | ICD-10-CM

## 2023-03-29 DIAGNOSIS — J4540 Moderate persistent asthma, uncomplicated: Secondary | ICD-10-CM | POA: Diagnosis not present

## 2023-03-29 DIAGNOSIS — Z7984 Long term (current) use of oral hypoglycemic drugs: Secondary | ICD-10-CM

## 2023-03-29 DIAGNOSIS — Z111 Encounter for screening for respiratory tuberculosis: Secondary | ICD-10-CM | POA: Diagnosis not present

## 2023-03-29 DIAGNOSIS — E1159 Type 2 diabetes mellitus with other circulatory complications: Secondary | ICD-10-CM | POA: Diagnosis not present

## 2023-03-29 DIAGNOSIS — I152 Hypertension secondary to endocrine disorders: Secondary | ICD-10-CM

## 2023-03-29 DIAGNOSIS — E1169 Type 2 diabetes mellitus with other specified complication: Secondary | ICD-10-CM

## 2023-03-29 DIAGNOSIS — M1A9XX0 Chronic gout, unspecified, without tophus (tophi): Secondary | ICD-10-CM

## 2023-03-29 DIAGNOSIS — E785 Hyperlipidemia, unspecified: Secondary | ICD-10-CM

## 2023-03-29 DIAGNOSIS — E1122 Type 2 diabetes mellitus with diabetic chronic kidney disease: Secondary | ICD-10-CM | POA: Diagnosis not present

## 2023-03-29 DIAGNOSIS — F3289 Other specified depressive episodes: Secondary | ICD-10-CM

## 2023-03-29 DIAGNOSIS — N184 Chronic kidney disease, stage 4 (severe): Secondary | ICD-10-CM

## 2023-03-29 NOTE — Assessment & Plan Note (Addendum)
Chronic Mild, persistent Controlled Continue Symbicort 2 puffs twice daily prn and albuterol inhaler as needed

## 2023-03-29 NOTE — Assessment & Plan Note (Signed)
Chronic   Lab Results  Component Value Date   HGBA1C 6.2 01/04/2023   Sugars have been well-controlled Check A1c Continue Farxiga 10 mg daily Continue regular exercise, diabetic diet

## 2023-03-29 NOTE — Assessment & Plan Note (Signed)
Chronic Denies gout flares Uric acid level well-controlled last fall Continue allopurinol 300 mg daily

## 2023-03-29 NOTE — Assessment & Plan Note (Addendum)
Chronic Controlled, Stable Continue Lexapro 20 mg daily Can adjust or change medication if depression does not seem to be as well-controlled

## 2023-03-29 NOTE — Assessment & Plan Note (Addendum)
Chronic Following with Dr. Signe Colt Kidney function has been stable On Farxiga 10 mg daily Sugars well-controlled Blood pressure well-controlled other than today-he does monitor regularly at home and it is well-controlled

## 2023-03-29 NOTE — Assessment & Plan Note (Signed)
Chronic Regular exercise and healthy diet encouraged Lab Results  Component Value Date   LDLCALC 66 07/06/2022   Continue pravastatin 10 mg daily

## 2023-03-31 ENCOUNTER — Ambulatory Visit: Payer: BC Managed Care – PPO

## 2023-03-31 LAB — TB SKIN TEST
Induration: 0 mm
TB Skin Test: NEGATIVE

## 2023-05-27 ENCOUNTER — Encounter (INDEPENDENT_AMBULATORY_CARE_PROVIDER_SITE_OTHER): Payer: Self-pay

## 2023-06-04 IMAGING — DX DG CHEST 2V
2 series · 2 of 2 positions shown · non-contrast
Comparison: November 05, 2020

CLINICAL DATA: Preoperative study prior to gastric bypass

EXAM:
CHEST - 2 VIEW

[chest pa]
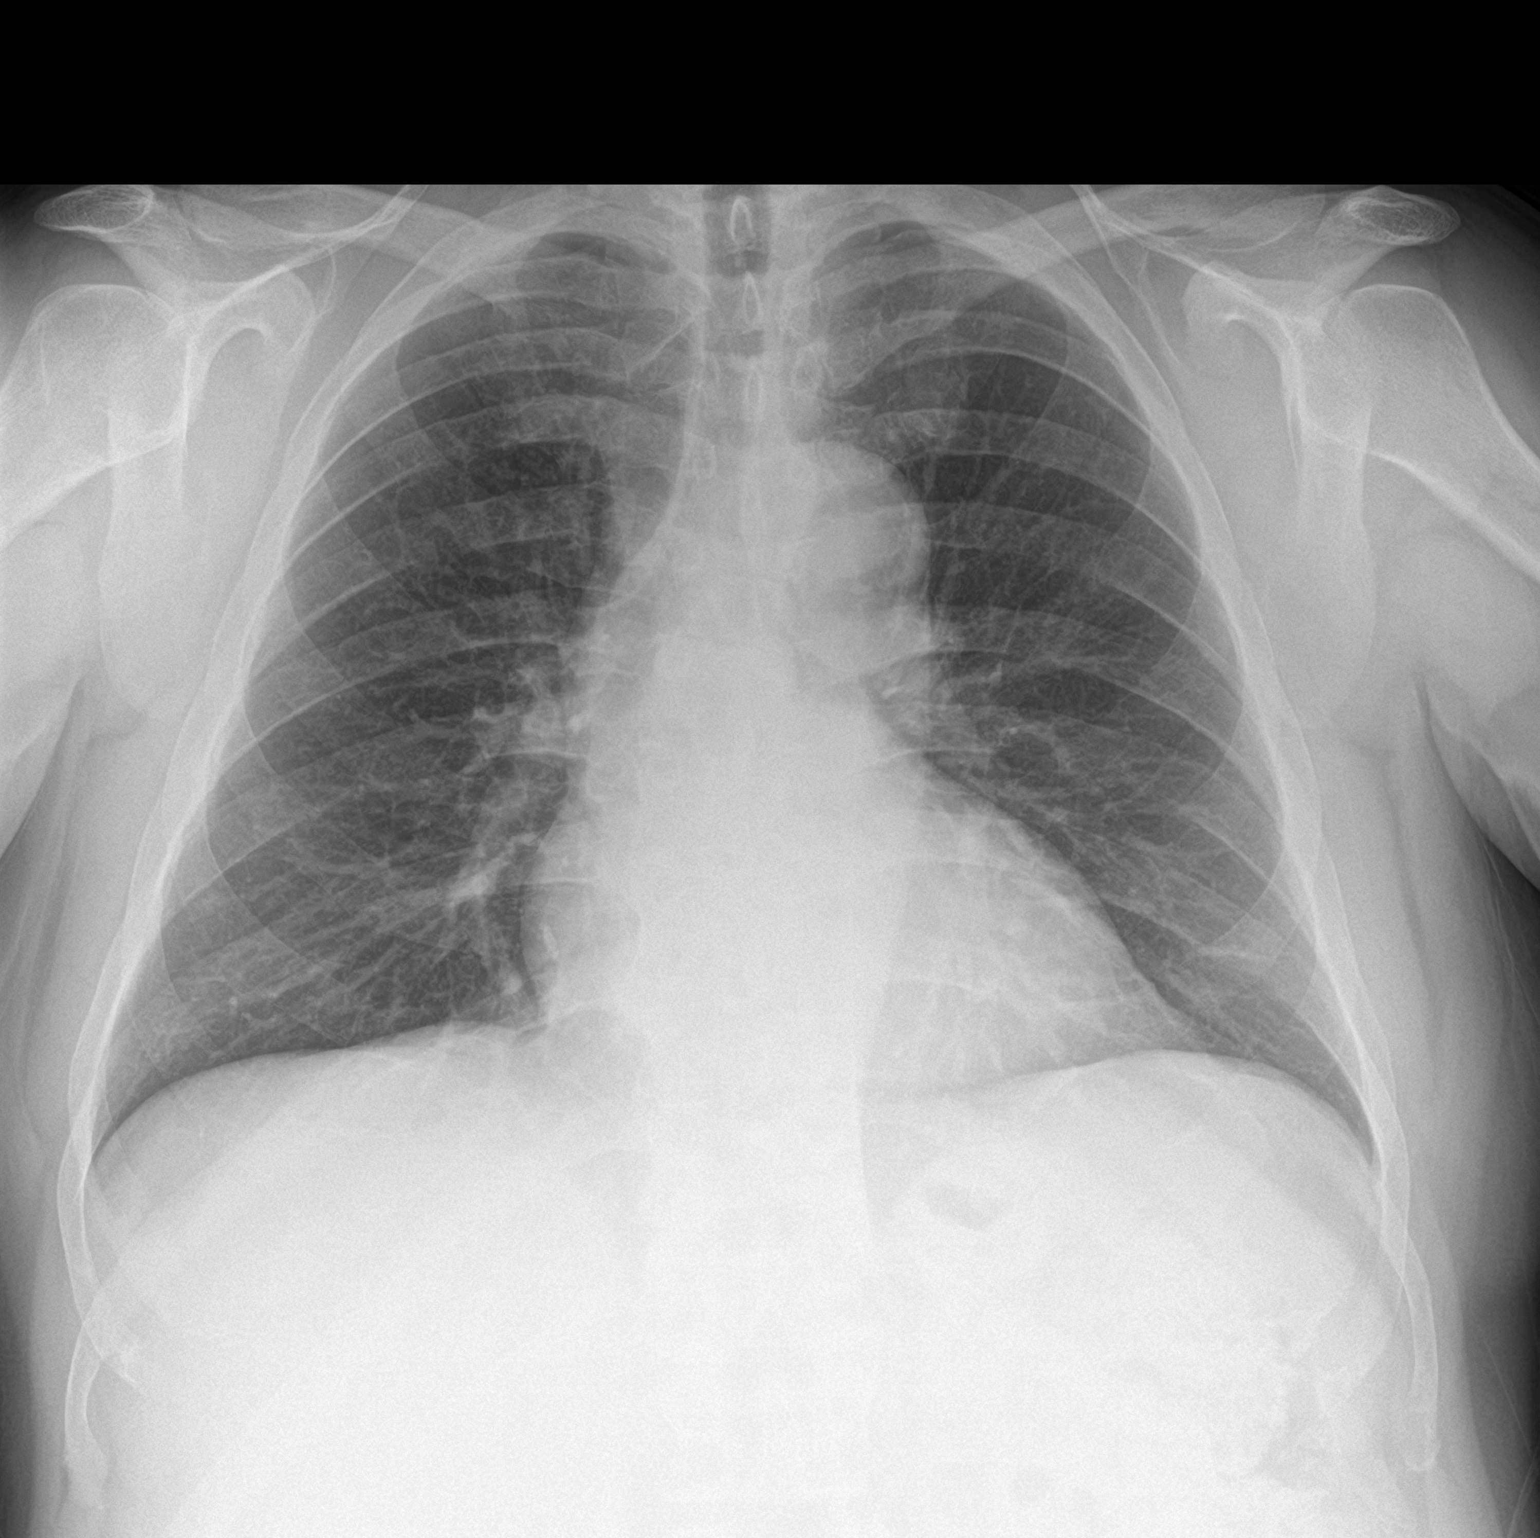

[chest lat]
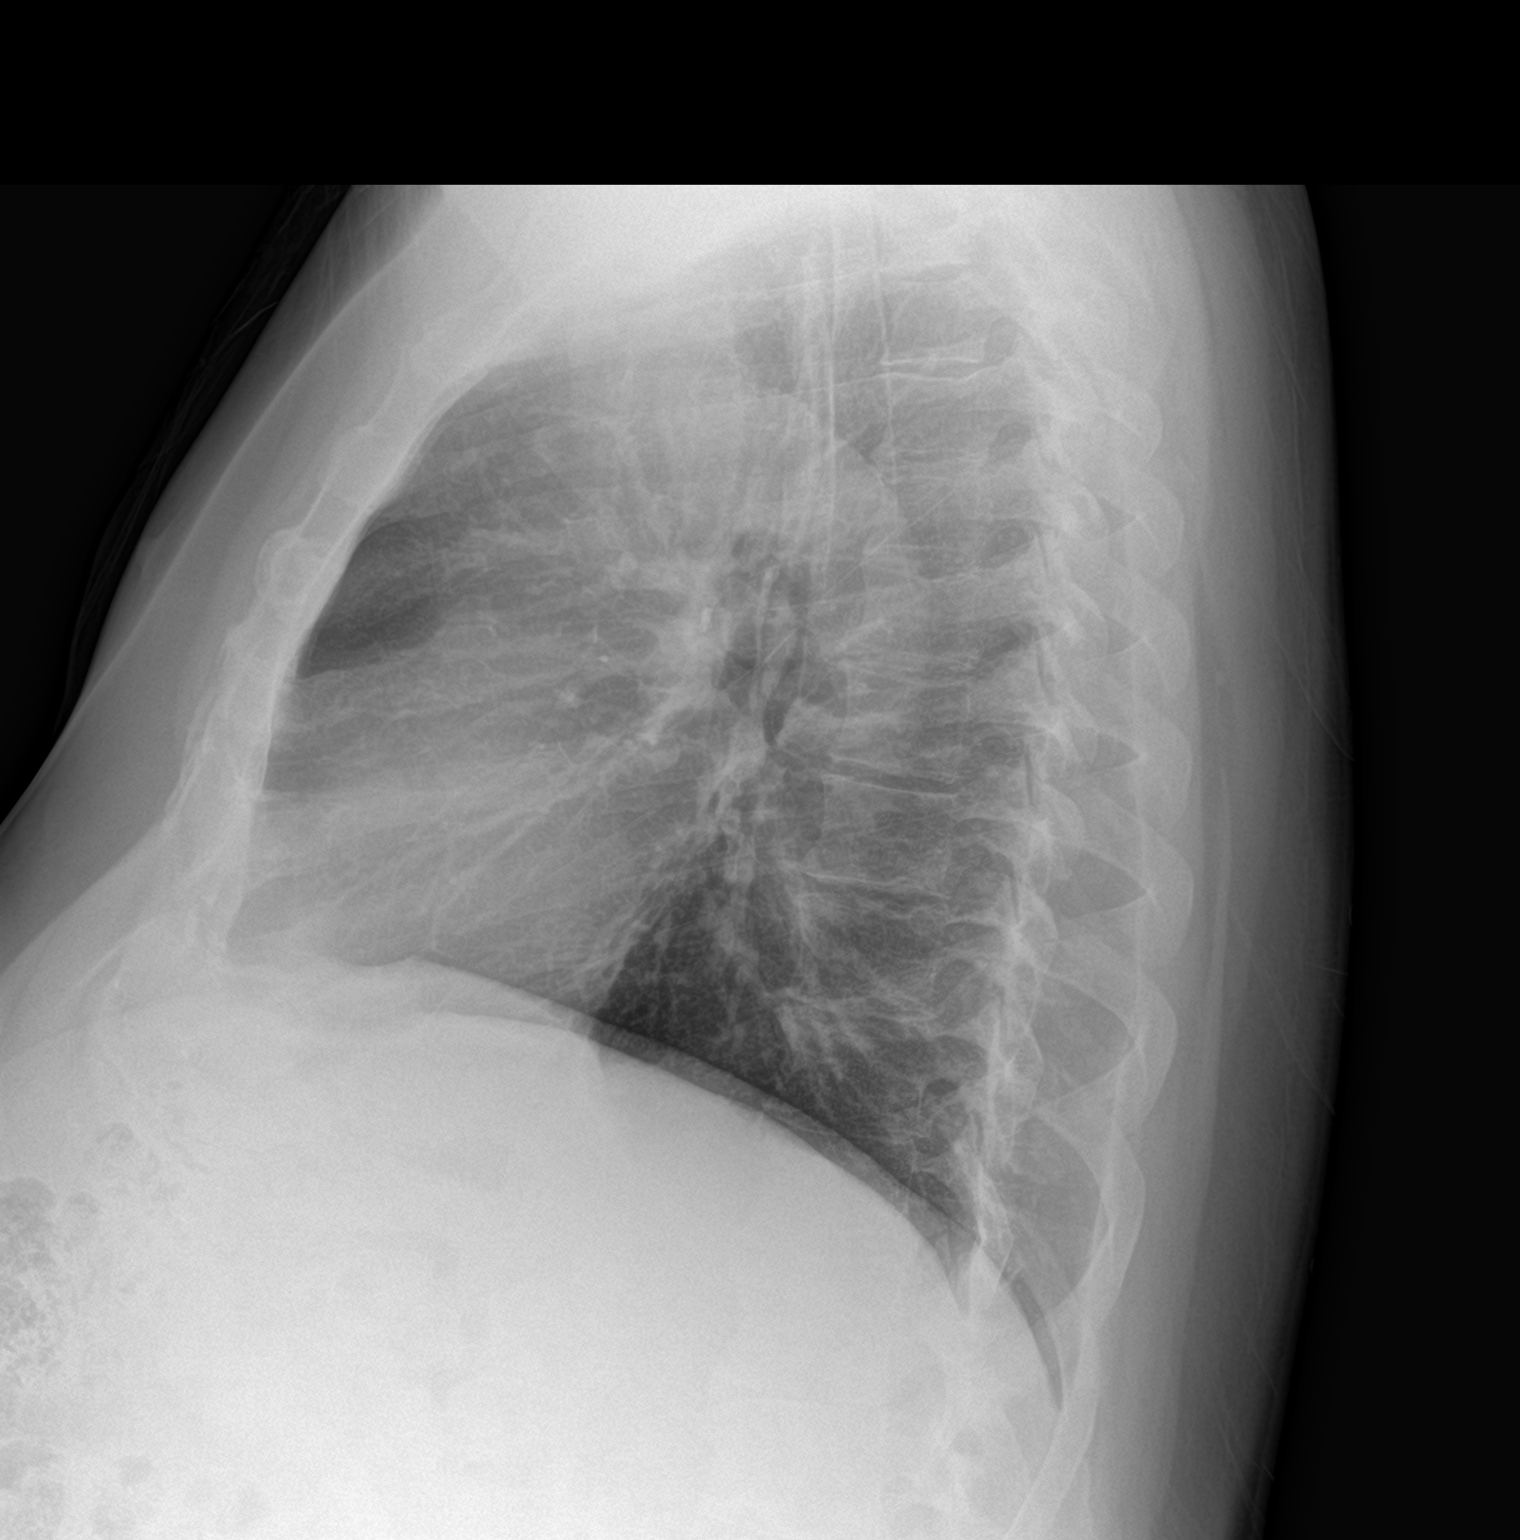

[2 of 2 positions shown; findings below may reference images not displayed]

FINDINGS: Mildly tortuous thoracic aorta, stable. The heart, hila, and
mediastinum are unchanged. No pneumothorax. No nodules or masses. No
focal infiltrates. No acute abnormalities.
IMPRESSION: No active cardiopulmonary disease.

## 2023-07-07 ENCOUNTER — Encounter: Payer: BC Managed Care – PPO | Admitting: Internal Medicine

## 2023-07-11 ENCOUNTER — Other Ambulatory Visit: Payer: Self-pay | Admitting: Internal Medicine

## 2023-07-12 MED ORDER — ALLOPURINOL 300 MG PO TABS: 300.0000 mg | ORAL_TABLET | Freq: Every day | ORAL | 0 refills | Status: DC

## 2023-08-16 ENCOUNTER — Encounter: Payer: Self-pay | Admitting: Internal Medicine

## 2023-08-16 NOTE — Progress Notes (Unsigned)
Subjective:    Patient ID: Frederick Fox, male    DOB: 02-01-1966, 57 y.o.   MRN: [id number]     HPI Frederick Fox is here for follow up of his chronic medical problems.  Joined a gym.  Walking, hiking.   Medications and allergies reviewed with patient and updated if appropriate.  Current Outpatient Medications on File Prior to Visit  Medication Sig Dispense Refill   albuterol (PROAIR HFA) 108 (90 Base) MCG/ACT inhaler Inhale 2 puffs into the lungs every 6 (six) hours as needed for wheezing or shortness of breath. 1 each 5   allopurinol (ZYLOPRIM) 300 MG tablet Take 1 tablet (300 mg total) by mouth daily. 90 tablet 0   amLODipine (NORVASC) 10 MG tablet Take 1 tablet (10 mg total) by mouth daily. 90 tablet 1   budesonide-formoterol (SYMBICORT) 80-4.5 MCG/ACT inhaler Inhale 2 puffs into the lungs 2 (two) times daily. 1 each 5   cetirizine (KLS ALLER-TEC) 10 MG tablet Take 10 mg by mouth at bedtime.     clomiPHENE (CLOMID) 50 MG tablet Take 50 mg by mouth daily.     escitalopram (LEXAPRO) 20 MG tablet Take 1 tablet (20 mg total) by mouth daily. 90 tablet 2   FARXIGA 10 MG TABS tablet Take 10 mg by mouth daily.     losartan (COZAAR) 100 MG tablet Take 1 tablet (100 mg total) by mouth daily. 90 tablet 1   mometasone-formoterol (DULERA) 200-5 MCG/ACT AERO Inhale 2 puffs into the lungs 2 (two) times daily. 13 g 5   Multiple Vitamin (MULTIVITAMIN ADULT PO) Take 1 tablet by mouth daily.     nebivolol (BYSTOLIC) 5 MG tablet Take 1 tablet (5 mg total) by mouth daily. 90 tablet 2   ondansetron (ZOFRAN-ODT) 4 MG disintegrating tablet Dissolve 1 tablet (4 mg total) by mouth every 6 (six) hours as needed for nausea or vomiting. 20 tablet 0   pantoprazole (PROTONIX) 40 MG tablet Take 1 tablet (40 mg total) by mouth daily. 60 tablet 0   pravastatin (PRAVACHOL) 10 MG tablet Take 1 tablet (10 mg total) by mouth daily. 90 tablet 1   sildenafil (VIAGRA) 100 MG tablet Take 0.5-1 tablets (50-100 mg  total) by mouth daily as needed for erectile dysfunction. 10 tablet 11   Turmeric 500 MG CAPS Take 500 mg by mouth daily.     No current facility-administered medications on file prior to visit.     Review of Systems  Constitutional:  Negative for fever.  Respiratory:  Positive for wheezing (occ). Negative for cough and shortness of breath.   Cardiovascular:  Negative for chest pain, palpitations and leg swelling.  Neurological:  Negative for light-headedness and headaches.       Objective:   Vitals:   08/17/23 0747  BP: 120/74  Pulse: 70  Temp: 98.6 F (37 C)  SpO2: 97%   BP Readings from Last 3 Encounters:  08/17/23 120/74  03/29/23 (!) 150/88  01/04/23 120/72   Wt Readings from Last 3 Encounters:  08/17/23 201 lb (91.2 kg)  03/29/23 193 lb 12.8 oz (87.9 kg)  01/04/23 199 lb (90.3 kg)   Body mass index is 29.68 kg/m.    Physical Exam Constitutional:      General: He is not in acute distress.    Appearance: Normal appearance. He is not ill-appearing.  HENT:     Head: Normocephalic and atraumatic.  Eyes:     Conjunctiva/sclera: Conjunctivae normal.  Cardiovascular:  Rate and Rhythm: Normal rate and regular rhythm.     Heart sounds: Normal heart sounds.  Pulmonary:     Effort: Pulmonary effort is normal. No respiratory distress.     Breath sounds: Normal breath sounds. No wheezing or rales.  Musculoskeletal:     Right lower leg: No edema.     Left lower leg: No edema.  Skin:    General: Skin is warm and dry.     Findings: No rash.  Neurological:     Mental Status: He is alert. Mental status is at baseline.  Psychiatric:        Mood and Affect: Mood normal.        Lab Results  Component Value Date   WBC 10.3 01/04/2023   HGB 14.8 01/04/2023   HCT 44.6 01/04/2023   PLT 212.0 01/04/2023   GLUCOSE 83 01/04/2023   CHOL 185 01/04/2023   TRIG (H) 01/04/2023    458.0 Triglyceride is over 400; calculations on Lipids are invalid.   HDL 50.20  01/04/2023   LDLDIRECT 75.0 01/04/2023   LDLCALC 66 07/06/2022   ALT 10 01/04/2023   AST 13 01/04/2023   NA 141 01/04/2023   K 4.1 01/04/2023   CL 108 01/04/2023   CREATININE 2.43 (H) 01/04/2023   BUN 46 (H) 01/04/2023   CO2 24 01/04/2023   TSH 1.42 01/04/2023   PSA 1.15 04/24/2013   HGBA1C 6.2 01/04/2023   MICROALBUR 117.0 (H) 01/19/2022     Assessment & Plan:    See Problem List for Assessment and Plan of chronic medical problems.

## 2023-08-16 NOTE — Patient Instructions (Incomplete)
     Flu immunization administered today.      Blood work was ordered.   The lab is on the first floor.    Medications changes include :   none      Return in about 6 months (around 02/14/2024) for follow up.

## 2023-08-17 ENCOUNTER — Encounter: Payer: BC Managed Care – PPO | Admitting: Internal Medicine

## 2023-08-17 ENCOUNTER — Ambulatory Visit: Payer: BC Managed Care – PPO | Admitting: Internal Medicine

## 2023-08-17 VITALS — BP 120/74 | HR 70 | Temp 98.6°F | Ht 69.0 in | Wt 201.0 lb

## 2023-08-17 DIAGNOSIS — E559 Vitamin D deficiency, unspecified: Secondary | ICD-10-CM | POA: Diagnosis not present

## 2023-08-17 DIAGNOSIS — Z23 Encounter for immunization: Secondary | ICD-10-CM | POA: Diagnosis not present

## 2023-08-17 DIAGNOSIS — E1122 Type 2 diabetes mellitus with diabetic chronic kidney disease: Secondary | ICD-10-CM | POA: Diagnosis not present

## 2023-08-17 DIAGNOSIS — E1159 Type 2 diabetes mellitus with other circulatory complications: Secondary | ICD-10-CM | POA: Diagnosis not present

## 2023-08-17 DIAGNOSIS — N184 Chronic kidney disease, stage 4 (severe): Secondary | ICD-10-CM | POA: Diagnosis not present

## 2023-08-17 DIAGNOSIS — Z7984 Long term (current) use of oral hypoglycemic drugs: Secondary | ICD-10-CM

## 2023-08-17 DIAGNOSIS — E785 Hyperlipidemia, unspecified: Secondary | ICD-10-CM

## 2023-08-17 DIAGNOSIS — K219 Gastro-esophageal reflux disease without esophagitis: Secondary | ICD-10-CM

## 2023-08-17 DIAGNOSIS — J4540 Moderate persistent asthma, uncomplicated: Secondary | ICD-10-CM

## 2023-08-17 DIAGNOSIS — I152 Hypertension secondary to endocrine disorders: Secondary | ICD-10-CM

## 2023-08-17 DIAGNOSIS — E1169 Type 2 diabetes mellitus with other specified complication: Secondary | ICD-10-CM

## 2023-08-17 DIAGNOSIS — F3289 Other specified depressive episodes: Secondary | ICD-10-CM

## 2023-08-17 DIAGNOSIS — M1A9XX0 Chronic gout, unspecified, without tophus (tophi): Secondary | ICD-10-CM

## 2023-08-17 LAB — LIPID PANEL
Cholesterol: 189 mg/dL (ref 0–200)
HDL: 60.5 mg/dL (ref >39.00)
LDL Cholesterol: 95 mg/dL (ref 0–99)
NonHDL: 128.67
Total CHOL/HDL Ratio: 3
Triglycerides: 168 mg/dL — ABNORMAL HIGH (ref 0.0–149.0)
VLDL: 33.6 mg/dL (ref 0.0–40.0)

## 2023-08-17 LAB — CBC WITH DIFFERENTIAL/PLATELET
Basophils Absolute: 0 10*3/uL (ref 0.0–0.1)
Basophils Relative: 0.5 % (ref 0.0–3.0)
Eosinophils Absolute: 0.2 10*3/uL (ref 0.0–0.7)
Eosinophils Relative: 2.3 % (ref 0.0–5.0)
HCT: 43.7 % (ref 39.0–52.0)
Hemoglobin: 14.2 g/dL (ref 13.0–17.0)
Lymphocytes Relative: 21.2 % (ref 12.0–46.0)
Lymphs Abs: 1.9 10*3/uL (ref 0.7–4.0)
MCHC: 32.5 g/dL (ref 30.0–36.0)
MCV: 86 fL (ref 78.0–100.0)
Monocytes Absolute: 0.7 10*3/uL (ref 0.1–1.0)
Monocytes Relative: 7.6 % (ref 3.0–12.0)
Neutro Abs: 6.3 10*3/uL (ref 1.4–7.7)
Neutrophils Relative %: 68.4 % (ref 43.0–77.0)
Platelets: 220 10*3/uL (ref 150.0–400.0)
RBC: 5.08 Mil/uL (ref 4.22–5.81)
RDW: 12.7 % (ref 11.5–15.5)
WBC: 9.2 10*3/uL (ref 4.0–10.5)

## 2023-08-17 LAB — COMPREHENSIVE METABOLIC PANEL
ALT: 15 U/L (ref 0–53)
AST: 18 U/L (ref 0–37)
Albumin: 4 g/dL (ref 3.5–5.2)
Alkaline Phosphatase: 112 U/L (ref 39–117)
BUN: 48 mg/dL — ABNORMAL HIGH (ref 6–23)
CO2: 29 meq/L (ref 19–32)
Calcium: 9 mg/dL (ref 8.4–10.5)
Chloride: 106 meq/L (ref 96–112)
Creatinine, Ser: 2.29 mg/dL — ABNORMAL HIGH (ref 0.40–1.50)
GFR: 31.21 mL/min — ABNORMAL LOW (ref >60.00)
Glucose, Bld: 92 mg/dL (ref 70–99)
Potassium: 4.3 meq/L (ref 3.5–5.1)
Sodium: 142 meq/L (ref 135–145)
Total Bilirubin: 0.4 mg/dL (ref 0.2–1.2)
Total Protein: 6.8 g/dL (ref 6.0–8.3)

## 2023-08-17 LAB — VITAMIN D 25 HYDROXY (VIT D DEFICIENCY, FRACTURES): VITD: 20.86 ng/mL — ABNORMAL LOW (ref 30.00–100.00)

## 2023-08-17 LAB — URIC ACID: Uric Acid, Serum: 5 mg/dL (ref 4.0–7.8)

## 2023-08-17 LAB — HEMOGLOBIN A1C: Hgb A1c MFr Bld: 6.1 % (ref 4.6–6.5)

## 2023-08-17 NOTE — Assessment & Plan Note (Signed)
Chronic   Lab Results  Component Value Date   HGBA1C 6.2 01/04/2023   Sugars have been well-controlled Check A1c Continue Farxiga 10 mg daily Continue regular exercise, diabetic diet

## 2023-08-17 NOTE — Assessment & Plan Note (Signed)
Chronic Regular exercise and healthy diet encouraged Lab Results  Component Value Date   LDLCALC 66 07/06/2022   Continue pravastatin 10 mg daily

## 2023-08-17 NOTE — Assessment & Plan Note (Signed)
Chronic GERD controlled Continue protonix 40 mg daily  

## 2023-08-17 NOTE — Assessment & Plan Note (Signed)
Chronic Taking vitamin D daily Check vitamin D level  

## 2023-08-17 NOTE — Assessment & Plan Note (Signed)
Chronic Mild, persistent Controlled Continue Symbicort 2 puffs twice daily prn and albuterol inhaler as needed

## 2023-08-17 NOTE — Assessment & Plan Note (Signed)
Chronic Following with Dr. Signe Colt Kidney function has been stable On Farxiga 10 mg daily Sugars well-controlled Blood pressure well-controlled

## 2023-08-17 NOTE — Assessment & Plan Note (Signed)
Chronic ?Controlled, Stable ?Continue Lexapro 20 mg daily ?

## 2023-08-17 NOTE — Assessment & Plan Note (Signed)
Chronic controlled Cmp,cbc Continue amlodipine 10 mg daily, losartan 100 mg daily, Bystolic 5 mg daily

## 2023-08-17 NOTE — Assessment & Plan Note (Signed)
Chronic Denies gout flares Uric acid level  Continue allopurinol 300 mg daily

## 2023-08-17 NOTE — Addendum Note (Signed)
Addended by: Karma Ganja on: 08/17/2023 02:53 PM   Modules accepted: Orders

## 2023-09-10 LAB — LAB REPORT - SCANNED
Albumin, Urine POC: 483.9
Microalb Creat Ratio: 990

## 2023-09-27 ENCOUNTER — Encounter: Payer: Self-pay | Admitting: Internal Medicine

## 2023-09-27 NOTE — Patient Instructions (Addendum)
      Blood work was ordered.       Medications changes include :   None    A referral was ordered and someone will call you to schedule an appointment.     Return in about 6 months (around 03/28/2024) for Physical Exam.

## 2023-09-27 NOTE — Progress Notes (Signed)
 Subjective:    Patient ID: Frederick Fox, male    DOB: [birth year], 57 y.o.   MRN: [id number]     HPI Deane is here for follow up of his chronic medical problems.    Medications and allergies reviewed with patient and updated if appropriate.  Current Outpatient Medications on File Prior to Visit  Medication Sig Dispense Refill   albuterol  (PROAIR  HFA) 108 (90 Base) MCG/ACT inhaler Inhale 2 puffs into the lungs every 6 (six) hours as needed for wheezing or shortness of breath. 1 each 5   allopurinol  (ZYLOPRIM ) 300 MG tablet Take 1 tablet (300 mg total) by mouth daily. 90 tablet 0   amLODipine  (NORVASC ) 10 MG tablet Take 1 tablet (10 mg total) by mouth daily. 90 tablet 1   budesonide -formoterol  (SYMBICORT ) 80-4.5 MCG/ACT inhaler Inhale 2 puffs into the lungs 2 (two) times daily. 1 each 5   cetirizine (KLS ALLER-TEC) 10 MG tablet Take 10 mg by mouth at bedtime.     clomiPHENE (CLOMID) 50 MG tablet Take 50 mg by mouth daily.     escitalopram  (LEXAPRO ) 20 MG tablet Take 1 tablet (20 mg total) by mouth daily. 90 tablet 2   FARXIGA 10 MG TABS tablet Take 10 mg by mouth daily.     losartan  (COZAAR ) 100 MG tablet Take 1 tablet (100 mg total) by mouth daily. 90 tablet 1   mometasone -formoterol  (DULERA ) 200-5 MCG/ACT AERO Inhale 2 puffs into the lungs 2 (two) times daily. 13 g 5   Multiple Vitamin (MULTIVITAMIN ADULT PO) Take 1 tablet by mouth daily.     nebivolol  (BYSTOLIC ) 5 MG tablet Take 1 tablet (5 mg total) by mouth daily. 90 tablet 2   pantoprazole  (PROTONIX ) 40 MG tablet Take 1 tablet (40 mg total) by mouth daily. 60 tablet 0   pravastatin  (PRAVACHOL ) 10 MG tablet Take 1 tablet (10 mg total) by mouth daily. 90 tablet 1   sildenafil  (VIAGRA ) 100 MG tablet Take 0.5-1 tablets (50-100 mg total) by mouth daily as needed for erectile dysfunction. 10 tablet 11   Turmeric 500 MG CAPS Take 500 mg by mouth daily.     No current facility-administered medications on file prior to visit.      Review of Systems     Objective:  There were no vitals filed for this visit. BP Readings from Last 3 Encounters:  08/17/23 120/74  03/29/23 (!) 150/88  01/04/23 120/72   Wt Readings from Last 3 Encounters:  08/17/23 201 lb (91.2 kg)  03/29/23 193 lb 12.8 oz (87.9 kg)  01/04/23 199 lb (90.3 kg)   There is no height or weight on file to calculate BMI.    Physical Exam     Lab Results  Component Value Date   WBC 9.2 08/17/2023   HGB 14.2 08/17/2023   HCT 43.7 08/17/2023   PLT 220.0 08/17/2023   GLUCOSE 92 08/17/2023   CHOL 189 08/17/2023   TRIG 168.0 (H) 08/17/2023   HDL 60.50 08/17/2023   LDLDIRECT 75.0 01/04/2023   LDLCALC 95 08/17/2023   ALT 15 08/17/2023   AST 18 08/17/2023   NA 142 08/17/2023   K 4.3 08/17/2023   CL 106 08/17/2023   CREATININE 2.29 (H) 08/17/2023   BUN 48 (H) 08/17/2023   CO2 29 08/17/2023   TSH 1.42 01/04/2023   PSA 1.15 04/24/2013   HGBA1C 6.1 08/17/2023   MICROALBUR 117.0 (H) 01/19/2022     Assessment & Plan:  See Problem List for Assessment and Plan of chronic medical problems.   This encounter was created in error - please disregard.

## 2023-09-28 ENCOUNTER — Encounter: Payer: BC Managed Care – PPO | Admitting: Internal Medicine

## 2023-09-28 DIAGNOSIS — E559 Vitamin D deficiency, unspecified: Secondary | ICD-10-CM

## 2023-09-28 DIAGNOSIS — E1122 Type 2 diabetes mellitus with diabetic chronic kidney disease: Secondary | ICD-10-CM

## 2023-09-28 DIAGNOSIS — J4540 Moderate persistent asthma, uncomplicated: Secondary | ICD-10-CM

## 2023-09-28 DIAGNOSIS — N184 Chronic kidney disease, stage 4 (severe): Secondary | ICD-10-CM

## 2023-09-28 DIAGNOSIS — I152 Hypertension secondary to endocrine disorders: Secondary | ICD-10-CM

## 2023-09-28 DIAGNOSIS — E1169 Type 2 diabetes mellitus with other specified complication: Secondary | ICD-10-CM

## 2023-09-28 DIAGNOSIS — K219 Gastro-esophageal reflux disease without esophagitis: Secondary | ICD-10-CM

## 2023-09-28 DIAGNOSIS — M1A9XX0 Chronic gout, unspecified, without tophus (tophi): Secondary | ICD-10-CM

## 2023-09-28 DIAGNOSIS — F3289 Other specified depressive episodes: Secondary | ICD-10-CM

## 2023-11-03 ENCOUNTER — Encounter (HOSPITAL_COMMUNITY): Payer: Self-pay | Admitting: *Deleted

## 2024-01-15 ENCOUNTER — Other Ambulatory Visit: Payer: Self-pay | Admitting: Internal Medicine

## 2024-03-07 ENCOUNTER — Ambulatory Visit: Payer: BC Managed Care – PPO | Admitting: Internal Medicine

## 2024-03-14 ENCOUNTER — Encounter: Payer: Self-pay | Admitting: Internal Medicine

## 2024-03-14 NOTE — Patient Instructions (Addendum)
      Blood work was ordered.       Medications changes include :   None    A referral was ordered and someone will call you to schedule an appointment.     Return in about 6 months (around 09/14/2024) for Physical Exam.

## 2024-03-14 NOTE — Progress Notes (Unsigned)
 Subjective:    Patient ID: Frederick Fox, male    DOB: [birth year], 58 y.o.   MRN: [id number]     HPI Frederick Fox is here for follow up of his chronic medical problems.  His mother passed away since he was here last.  That has been difficult.  He really wants to work on his overall health  He is moving back to [place].  [employer] 3/week.  Eating well.     Medications and allergies reviewed with patient and updated if appropriate.  Current Outpatient Medications on File Prior to Visit  Medication Sig Dispense Refill   albuterol  (PROAIR  HFA) 108 (90 Base) MCG/ACT inhaler Inhale 2 puffs into the lungs every 6 (six) hours as needed for wheezing or shortness of breath. 1 each 5   allopurinol  (ZYLOPRIM ) 300 MG tablet Take 1 tablet by mouth once daily 90 tablet 0   amLODipine  (NORVASC ) 10 MG tablet Take 1 tablet (10 mg total) by mouth daily. 90 tablet 1   budesonide -formoterol  (SYMBICORT ) 80-4.5 MCG/ACT inhaler Inhale 2 puffs into the lungs 2 (two) times daily. 1 each 5   cetirizine (KLS ALLER-TEC) 10 MG tablet Take 10 mg by mouth at bedtime.     clomiPHENE (CLOMID) 50 MG tablet Take 50 mg by mouth daily.     escitalopram  (LEXAPRO ) 20 MG tablet Take 1 tablet (20 mg total) by mouth daily. 90 tablet 2   FARXIGA 10 MG TABS tablet Take 10 mg by mouth daily.     losartan  (COZAAR ) 100 MG tablet Take 1 tablet (100 mg total) by mouth daily. 90 tablet 1   mometasone -formoterol  (DULERA ) 200-5 MCG/ACT AERO Inhale 2 puffs into the lungs 2 (two) times daily. 13 g 5   Multiple Vitamin (MULTIVITAMIN ADULT PO) Take 1 tablet by mouth daily.     nebivolol  (BYSTOLIC ) 5 MG tablet Take 1 tablet (5 mg total) by mouth daily. 90 tablet 2   pantoprazole  (PROTONIX ) 40 MG tablet Take 1 tablet (40 mg total) by mouth daily. 60 tablet 0   pravastatin  (PRAVACHOL ) 10 MG tablet Take 1 tablet (10 mg total) by mouth daily. 90 tablet 1   sildenafil  (VIAGRA ) 100 MG tablet Take 0.5-1 tablets (50-100 mg total)  by mouth daily as needed for erectile dysfunction. 10 tablet 11   Turmeric 500 MG CAPS Take 500 mg by mouth daily.     No current facility-administered medications on file prior to visit.     Review of Systems  Constitutional:  Negative for fever.  Respiratory:  Negative for cough, shortness of breath and wheezing.   Cardiovascular:  Negative for chest pain, palpitations and leg swelling.  Neurological:  Negative for light-headedness and headaches.       Objective:   Vitals:   03/15/24 1512  BP: 126/72  Pulse: 74  Temp: 98.6 F (37 C)  SpO2: 98%   BP Readings from Last 3 Encounters:  03/15/24 126/72  08/17/23 120/74  03/29/23 (!) 150/88   Wt Readings from Last 3 Encounters:  03/15/24 201 lb (91.2 kg)  08/17/23 201 lb (91.2 kg)  03/29/23 193 lb 12.8 oz (87.9 kg)   Body mass index is 29.68 kg/m.    Physical Exam Constitutional:      General: He is not in acute distress.    Appearance: Normal appearance. He is not ill-appearing.  HENT:     Head: Normocephalic and atraumatic.  Eyes:     Conjunctiva/sclera: Conjunctivae normal.  Cardiovascular:  Rate and Rhythm: Normal rate and regular rhythm.     Heart sounds: Normal heart sounds.  Pulmonary:     Effort: Pulmonary effort is normal. No respiratory distress.     Breath sounds: Normal breath sounds. No wheezing or rales.  Musculoskeletal:     Right lower leg: No edema.     Left lower leg: No edema.  Skin:    General: Skin is warm and dry.     Findings: No rash.  Neurological:     Mental Status: He is alert. Mental status is at baseline.  Psychiatric:        Mood and Affect: Mood normal.        Lab Results  Component Value Date   WBC 9.2 08/17/2023   HGB 14.2 08/17/2023   HCT 43.7 08/17/2023   PLT 220.0 08/17/2023   GLUCOSE 92 08/17/2023   CHOL 189 08/17/2023   TRIG 168.0 (H) 08/17/2023   HDL 60.50 08/17/2023   LDLDIRECT 75.0 01/04/2023   LDLCALC 95 08/17/2023   ALT 15 08/17/2023   AST 18  08/17/2023   NA 142 08/17/2023   K 4.3 08/17/2023   CL 106 08/17/2023   CREATININE 2.29 (H) 08/17/2023   BUN 48 (H) 08/17/2023   CO2 29 08/17/2023   TSH 1.42 01/04/2023   PSA 1.15 04/24/2013   HGBA1C 6.1 08/17/2023   MICROALBUR 117.0 (H) 01/19/2022     Assessment & Plan:    See Problem List for Assessment and Plan of chronic medical problems.

## 2024-03-15 ENCOUNTER — Encounter: Payer: Self-pay | Admitting: Internal Medicine

## 2024-03-15 ENCOUNTER — Ambulatory Visit: Payer: Self-pay | Admitting: Internal Medicine

## 2024-03-15 VITALS — BP 126/72 | HR 74 | Temp 98.6°F | Ht 69.0 in | Wt 201.0 lb

## 2024-03-15 DIAGNOSIS — M1A9XX0 Chronic gout, unspecified, without tophus (tophi): Secondary | ICD-10-CM

## 2024-03-15 DIAGNOSIS — Z136 Encounter for screening for cardiovascular disorders: Secondary | ICD-10-CM

## 2024-03-15 DIAGNOSIS — E1122 Type 2 diabetes mellitus with diabetic chronic kidney disease: Secondary | ICD-10-CM | POA: Diagnosis not present

## 2024-03-15 DIAGNOSIS — F3289 Other specified depressive episodes: Secondary | ICD-10-CM

## 2024-03-15 DIAGNOSIS — Z125 Encounter for screening for malignant neoplasm of prostate: Secondary | ICD-10-CM | POA: Diagnosis not present

## 2024-03-15 DIAGNOSIS — Z7984 Long term (current) use of oral hypoglycemic drugs: Secondary | ICD-10-CM | POA: Diagnosis not present

## 2024-03-15 DIAGNOSIS — J4540 Moderate persistent asthma, uncomplicated: Secondary | ICD-10-CM

## 2024-03-15 DIAGNOSIS — E785 Hyperlipidemia, unspecified: Secondary | ICD-10-CM

## 2024-03-15 DIAGNOSIS — N184 Chronic kidney disease, stage 4 (severe): Secondary | ICD-10-CM

## 2024-03-15 DIAGNOSIS — I152 Hypertension secondary to endocrine disorders: Secondary | ICD-10-CM | POA: Diagnosis not present

## 2024-03-15 DIAGNOSIS — E559 Vitamin D deficiency, unspecified: Secondary | ICD-10-CM

## 2024-03-15 DIAGNOSIS — G4733 Obstructive sleep apnea (adult) (pediatric): Secondary | ICD-10-CM

## 2024-03-15 DIAGNOSIS — E1159 Type 2 diabetes mellitus with other circulatory complications: Secondary | ICD-10-CM

## 2024-03-15 DIAGNOSIS — K219 Gastro-esophageal reflux disease without esophagitis: Secondary | ICD-10-CM

## 2024-03-15 DIAGNOSIS — E1169 Type 2 diabetes mellitus with other specified complication: Secondary | ICD-10-CM

## 2024-03-15 LAB — COMPREHENSIVE METABOLIC PANEL WITH GFR
ALT: 20 U/L (ref 0–53)
AST: 21 U/L (ref 0–37)
Albumin: 4.2 g/dL (ref 3.5–5.2)
Alkaline Phosphatase: 143 U/L — ABNORMAL HIGH (ref 39–117)
BUN: 39 mg/dL — ABNORMAL HIGH (ref 6–23)
CO2: 26 meq/L (ref 19–32)
Calcium: 9 mg/dL (ref 8.4–10.5)
Chloride: 103 meq/L (ref 96–112)
Creatinine, Ser: 2.35 mg/dL — ABNORMAL HIGH (ref 0.40–1.50)
GFR: 30.14 mL/min — ABNORMAL LOW (ref >60.00)
Glucose, Bld: 114 mg/dL — ABNORMAL HIGH (ref 70–99)
Potassium: 3.9 meq/L (ref 3.5–5.1)
Sodium: 139 meq/L (ref 135–145)
Total Bilirubin: 0.5 mg/dL (ref 0.2–1.2)
Total Protein: 6.6 g/dL (ref 6.0–8.3)

## 2024-03-15 LAB — CBC WITH DIFFERENTIAL/PLATELET
Basophils Absolute: 0.1 10*3/uL (ref 0.0–0.1)
Basophils Relative: 0.8 % (ref 0.0–3.0)
Eosinophils Absolute: 0.4 10*3/uL (ref 0.0–0.7)
Eosinophils Relative: 3.8 % (ref 0.0–5.0)
HCT: 45.1 % (ref 39.0–52.0)
Hemoglobin: 14.7 g/dL (ref 13.0–17.0)
Lymphocytes Relative: 20.7 % (ref 12.0–46.0)
Lymphs Abs: 2.1 10*3/uL (ref 0.7–4.0)
MCHC: 32.7 g/dL (ref 30.0–36.0)
MCV: 80.4 fl (ref 78.0–100.0)
Monocytes Absolute: 0.7 10*3/uL (ref 0.1–1.0)
Monocytes Relative: 6.8 % (ref 3.0–12.0)
Neutro Abs: 6.9 10*3/uL (ref 1.4–7.7)
Neutrophils Relative %: 67.9 % (ref 43.0–77.0)
Platelets: 203 10*3/uL (ref 150.0–400.0)
RBC: 5.61 Mil/uL (ref 4.22–5.81)
RDW: 14.9 % (ref 11.5–15.5)
WBC: 10.2 10*3/uL (ref 4.0–10.5)

## 2024-03-15 LAB — LIPID PANEL
Cholesterol: 251 mg/dL — ABNORMAL HIGH (ref 0–200)
HDL: 54.7 mg/dL (ref >39.00)
LDL Cholesterol: 149 mg/dL — ABNORMAL HIGH (ref 0–99)
NonHDL: 196.7
Total CHOL/HDL Ratio: 5
Triglycerides: 240 mg/dL — ABNORMAL HIGH (ref 0.0–149.0)
VLDL: 48 mg/dL — ABNORMAL HIGH (ref 0.0–40.0)

## 2024-03-15 LAB — VITAMIN D 25 HYDROXY (VIT D DEFICIENCY, FRACTURES): VITD: 19 ng/mL — ABNORMAL LOW (ref 30.00–100.00)

## 2024-03-15 LAB — HEMOGLOBIN A1C: Hgb A1c MFr Bld: 6.5 % (ref 4.6–6.5)

## 2024-03-15 LAB — PSA, MEDICARE: PSA: 2.61 ng/mL (ref 0.10–4.00)

## 2024-03-15 NOTE — Assessment & Plan Note (Signed)
Chronic ?Controlled, Stable ?Continue Lexapro 20 mg daily ?

## 2024-03-15 NOTE — Assessment & Plan Note (Signed)
 Chronic   Lab Results  Component Value Date   HGBA1C 6.1 08/17/2023   Sugars have been well-controlled Check A1c Continue Farxiga 10 mg daily Continue regular exercise, diabetic diet

## 2024-03-15 NOTE — Assessment & Plan Note (Signed)
 Chronic Following with Dr. Christianne Cowper Kidney function has been stable On Farxiga 10 mg daily, losartan  100 mg daily Sugars well-controlled Blood pressure well-controlled

## 2024-03-15 NOTE — Assessment & Plan Note (Signed)
 Chronic controlled Cmp,cbc Continue amlodipine 10 mg daily, losartan 100 mg daily, Bystolic 5 mg daily

## 2024-03-15 NOTE — Assessment & Plan Note (Signed)
Chronic GERD controlled Continue protonix 40 mg daily  

## 2024-03-15 NOTE — Assessment & Plan Note (Signed)
 Chronic Regular exercise and healthy diet encouraged Lab Results  Component Value Date   LDLCALC 95 08/17/2023   Continue pravastatin  10 mg daily

## 2024-03-15 NOTE — Assessment & Plan Note (Signed)
Chronic Denies gout flares Continue allopurinol 300 mg daily 

## 2024-03-15 NOTE — Assessment & Plan Note (Signed)
Chronic Mild, persistent Controlled Continue Symbicort 2 puffs twice daily prn and albuterol inhaler as needed

## 2024-03-15 NOTE — Assessment & Plan Note (Signed)
 Chronic Taking vitamin D daily Check vitamin D level

## 2024-03-15 NOTE — Assessment & Plan Note (Addendum)
 Chronic With his weight loss needs to be reevaluated and possibly have another sleep study and titration Will follow-up with pulmonary

## 2024-03-16 ENCOUNTER — Ambulatory Visit: Payer: Self-pay | Admitting: Internal Medicine

## 2024-03-23 ENCOUNTER — Ambulatory Visit (HOSPITAL_COMMUNITY)
Admission: RE | Admit: 2024-03-23 | Discharge: 2024-03-23 | Disposition: A | Discharge status: Home / Self Care | Payer: Self-pay | Source: Ambulatory Visit | Attending: Internal Medicine | Admitting: Internal Medicine

## 2024-03-23 ENCOUNTER — Other Ambulatory Visit: Payer: Self-pay | Admitting: Internal Medicine

## 2024-03-23 DIAGNOSIS — Z136 Encounter for screening for cardiovascular disorders: Secondary | ICD-10-CM | POA: Insufficient documentation

## 2024-04-04 ENCOUNTER — Encounter: Payer: Self-pay | Admitting: Internal Medicine

## 2024-04-05 ENCOUNTER — Other Ambulatory Visit: Payer: Self-pay

## 2024-04-05 MED ORDER — ESCITALOPRAM OXALATE 20 MG PO TABS: 20.0000 mg | ORAL_TABLET | Freq: Every day | ORAL | 2 refills | Status: AC

## 2024-05-19 ENCOUNTER — Encounter: Payer: Self-pay | Admitting: Internal Medicine

## 2024-05-22 ENCOUNTER — Other Ambulatory Visit: Payer: Self-pay | Admitting: Internal Medicine

## 2024-05-22 DIAGNOSIS — N529 Male erectile dysfunction, unspecified: Secondary | ICD-10-CM

## 2024-05-22 MED ORDER — SILDENAFIL CITRATE 100 MG PO TABS: 50.0000 mg | ORAL_TABLET | Freq: Every day | ORAL | 11 refills | Status: AC | PRN

## 2024-05-22 NOTE — Telephone Encounter (Signed)
 Copied from CRM #[id number]. Topic: Clinical - Medication Refill >> May 22, 2024  9:55 AM Suzen RAMAN wrote: Medication: sildenafil  (VIAGRA ) 100 MG tablet  Has the patient contacted their pharmacy? Yes  This is the patient's preferred pharmacy:  [facility] [address], [place] - [address] [place] [address] Phone: [phone] Fax: [phone]    Is this the correct pharmacy for this prescription? Yes If no, delete pharmacy and type the correct one.   Has the prescription been filled recently? No  Is the patient out of the medication? Yes  Has the patient been seen for an appointment in the last year OR does the patient have an upcoming appointment? Yes  Can we respond through MyChart? Yes  Agent: Please be advised that Rx refills may take up to 3 business days. We ask that you follow-up with your pharmacy.

## 2024-07-04 ENCOUNTER — Other Ambulatory Visit: Payer: Self-pay

## 2024-07-04 ENCOUNTER — Encounter: Payer: Self-pay | Admitting: Internal Medicine

## 2024-07-04 MED ORDER — ALLOPURINOL 300 MG PO TABS: 300.0000 mg | ORAL_TABLET | Freq: Every day | ORAL | 0 refills | Status: AC

## 2024-07-04 MED ORDER — NEBIVOLOL HCL 5 MG PO TABS: 5.0000 mg | ORAL_TABLET | Freq: Every day | ORAL | 0 refills | Status: AC

## 2024-08-08 ENCOUNTER — Encounter: Payer: Self-pay | Admitting: Internal Medicine

## 2024-08-08 ENCOUNTER — Other Ambulatory Visit: Payer: Self-pay

## 2024-08-08 MED ORDER — LOSARTAN POTASSIUM 100 MG PO TABS: 100.0000 mg | ORAL_TABLET | Freq: Every day | ORAL | 1 refills | Status: AC

## 2024-08-08 MED ORDER — AMLODIPINE BESYLATE 10 MG PO TABS
10.0000 mg | ORAL_TABLET | Freq: Every day | ORAL | 1 refills | Status: AC
Start: 2024-08-08 — End: ?

## 2024-10-19 ENCOUNTER — Telehealth: Payer: Self-pay

## 2024-10-19 NOTE — Telephone Encounter (Signed)
 Attempted to reach patient concerning colonoscopy recall; unable to speak with patient;  left message and number to the office for patient to call back and schedule appts;

## 2024-11-06 ENCOUNTER — Encounter: Payer: Self-pay | Admitting: Internal Medicine

## 2024-11-06 NOTE — Progress Notes (Unsigned)
 "   Subjective:    Patient ID: Frederick Fox, male    DOB: 08/09/1966, 59 y.o.   MRN: [id number]      HPI Frederick Fox is here for No chief complaint on file.        Medications and allergies reviewed with patient and updated if appropriate.  Medications Ordered Prior to Encounter[1]  Review of Systems     Objective:  There were no vitals filed for this visit. BP Readings from Last 3 Encounters:  03/15/24 126/72  08/17/23 120/74  03/29/23 (!) 150/88   Wt Readings from Last 3 Encounters:  03/15/24 201 lb (91.2 kg)  08/17/23 201 lb (91.2 kg)  03/29/23 193 lb 12.8 oz (87.9 kg)   There is no height or weight on file to calculate BMI.    Physical Exam Constitutional:      General: He is not in acute distress.    Appearance: Normal appearance. He is not ill-appearing.  HENT:     Head: Normocephalic.     Right Ear: Tympanic membrane, ear canal and external ear normal. There is no impacted cerumen.     Left Ear: Tympanic membrane, ear canal and external ear normal. There is no impacted cerumen.     Mouth/Throat:     Mouth: Mucous membranes are moist.     Pharynx: No oropharyngeal exudate or posterior oropharyngeal erythema.  Eyes:     Conjunctiva/sclera: Conjunctivae normal.  Cardiovascular:     Rate and Rhythm: Normal rate and regular rhythm.  Pulmonary:     Effort: Pulmonary effort is normal. No respiratory distress.     Breath sounds: Normal breath sounds. No wheezing or rales.  Musculoskeletal:     Cervical back: Neck supple. No tenderness.  Lymphadenopathy:     Cervical: No cervical adenopathy.  Skin:    General: Skin is warm and dry.     Findings: No rash.  Neurological:     Mental Status: He is alert.            Assessment & Plan:    See Problem List for Assessment and Plan of chronic medical problems.         [1]  Current Outpatient Medications on File Prior to Visit  Medication Sig Dispense Refill   albuterol  (PROAIR  HFA) 108 (90  Base) MCG/ACT inhaler Inhale 2 puffs into the lungs every 6 (six) hours as needed for wheezing or shortness of breath. 1 each 5   allopurinol  (ZYLOPRIM ) 300 MG tablet Take 1 tablet (300 mg total) by mouth daily. 90 tablet 0   amLODipine  (NORVASC ) 10 MG tablet Take 1 tablet (10 mg total) by mouth daily. 90 tablet 1   budesonide -formoterol  (SYMBICORT ) 80-4.5 MCG/ACT inhaler Inhale 2 puffs into the lungs 2 (two) times daily. 1 each 5   cetirizine (KLS ALLER-TEC) 10 MG tablet Take 10 mg by mouth at bedtime.     clomiPHENE (CLOMID) 50 MG tablet Take 50 mg by mouth daily.     escitalopram  (LEXAPRO ) 20 MG tablet Take 1 tablet (20 mg total) by mouth daily. 90 tablet 2   FARXIGA 10 MG TABS tablet Take 10 mg by mouth daily.     losartan  (COZAAR ) 100 MG tablet Take 1 tablet (100 mg total) by mouth daily. 90 tablet 1   mometasone -formoterol  (DULERA ) 200-5 MCG/ACT AERO Inhale 2 puffs into the lungs 2 (two) times daily. 13 g 5   Multiple Vitamin (MULTIVITAMIN ADULT PO) Take 1 tablet by mouth daily.  nebivolol  (BYSTOLIC ) 5 MG tablet Take 1 tablet (5 mg total) by mouth daily. 90 tablet 0   pantoprazole  (PROTONIX ) 40 MG tablet Take 1 tablet (40 mg total) by mouth daily. 60 tablet 0   pravastatin  (PRAVACHOL ) 10 MG tablet Take 1 tablet (10 mg total) by mouth daily. 90 tablet 1   sildenafil  (VIAGRA ) 100 MG tablet Take 0.5-1 tablets (50-100 mg total) by mouth daily as needed for erectile dysfunction. 10 tablet 11   Turmeric 500 MG CAPS Take 500 mg by mouth daily.     No current facility-administered medications on file prior to visit.   "

## 2024-11-06 NOTE — Patient Instructions (Addendum)
       Medications changes include :   None        Return if symptoms worsen or fail to improve.

## 2024-11-06 NOTE — Progress Notes (Signed)
 "   Subjective:    Patient ID: Frederick Fox, male    DOB: [birth year], 59 y.o.   MRN: [id number]      HPI Rahul is here for No chief complaint on file.        Medications and allergies reviewed with patient and updated if appropriate.  Medications Ordered Prior to Encounter[1]  Review of Systems     Objective:  There were no vitals filed for this visit. BP Readings from Last 3 Encounters:  03/15/24 126/72  08/17/23 120/74  03/29/23 (!) 150/88   Wt Readings from Last 3 Encounters:  03/15/24 201 lb (91.2 kg)  08/17/23 201 lb (91.2 kg)  03/29/23 193 lb 12.8 oz (87.9 kg)   There is no height or weight on file to calculate BMI.    Physical Exam Constitutional:      General: He is not in acute distress.    Appearance: Normal appearance. He is not ill-appearing.  HENT:     Head: Normocephalic.     Right Ear: Tympanic membrane, ear canal and external ear normal. There is no impacted cerumen.     Left Ear: Tympanic membrane, ear canal and external ear normal. There is no impacted cerumen.     Mouth/Throat:     Mouth: Mucous membranes are moist.     Pharynx: No oropharyngeal exudate or posterior oropharyngeal erythema.  Eyes:     Conjunctiva/sclera: Conjunctivae normal.  Cardiovascular:     Rate and Rhythm: Normal rate and regular rhythm.  Pulmonary:     Effort: Pulmonary effort is normal. No respiratory distress.     Breath sounds: Normal breath sounds. No wheezing or rales.  Musculoskeletal:     Cervical back: Neck supple. No tenderness.  Lymphadenopathy:     Cervical: No cervical adenopathy.  Skin:    General: Skin is warm and dry.     Findings: No rash.  Neurological:     Mental Status: He is alert.            Assessment & Plan:    See Problem List for Assessment and Plan of chronic medical problems.     This encounter was created in error - please disregard.    [1]  Current Outpatient Medications on File Prior to Visit  Medication  Sig Dispense Refill   albuterol  (PROAIR  HFA) 108 (90 Base) MCG/ACT inhaler Inhale 2 puffs into the lungs every 6 (six) hours as needed for wheezing or shortness of breath. 1 each 5   allopurinol  (ZYLOPRIM ) 300 MG tablet Take 1 tablet (300 mg total) by mouth daily. 90 tablet 0   amLODipine  (NORVASC ) 10 MG tablet Take 1 tablet (10 mg total) by mouth daily. 90 tablet 1   budesonide -formoterol  (SYMBICORT ) 80-4.5 MCG/ACT inhaler Inhale 2 puffs into the lungs 2 (two) times daily. 1 each 5   cetirizine (KLS ALLER-TEC) 10 MG tablet Take 10 mg by mouth at bedtime.     clomiPHENE (CLOMID) 50 MG tablet Take 50 mg by mouth daily.     escitalopram  (LEXAPRO ) 20 MG tablet Take 1 tablet (20 mg total) by mouth daily. 90 tablet 2   FARXIGA 10 MG TABS tablet Take 10 mg by mouth daily.     losartan  (COZAAR ) 100 MG tablet Take 1 tablet (100 mg total) by mouth daily. 90 tablet 1   mometasone -formoterol  (DULERA ) 200-5 MCG/ACT AERO Inhale 2 puffs into the lungs 2 (two) times daily. 13 g 5   Multiple Vitamin (MULTIVITAMIN ADULT PO) Take  1 tablet by mouth daily.     nebivolol  (BYSTOLIC ) 5 MG tablet Take 1 tablet (5 mg total) by mouth daily. 90 tablet 0   pantoprazole  (PROTONIX ) 40 MG tablet Take 1 tablet (40 mg total) by mouth daily. 60 tablet 0   pravastatin  (PRAVACHOL ) 10 MG tablet Take 1 tablet (10 mg total) by mouth daily. 90 tablet 1   sildenafil  (VIAGRA ) 100 MG tablet Take 0.5-1 tablets (50-100 mg total) by mouth daily as needed for erectile dysfunction. 10 tablet 11   Turmeric 500 MG CAPS Take 500 mg by mouth daily.     No current facility-administered medications on file prior to visit.   "

## 2024-11-06 NOTE — Patient Instructions (Addendum)
" ° ° °  Have a chest xray today.      Medications changes include :   None      Return for follow up.  "

## 2024-11-07 ENCOUNTER — Ambulatory Visit: Admitting: Internal Medicine

## 2024-11-07 ENCOUNTER — Ambulatory Visit

## 2024-11-07 ENCOUNTER — Encounter: Payer: Self-pay | Admitting: Internal Medicine

## 2024-11-07 ENCOUNTER — Encounter: Admitting: Internal Medicine

## 2024-11-07 ENCOUNTER — Ambulatory Visit: Payer: Self-pay | Admitting: Internal Medicine

## 2024-11-07 VITALS — BP 130/72 | HR 80 | Temp 98.4°F | Ht 69.0 in | Wt 203.0 lb

## 2024-11-07 DIAGNOSIS — E1159 Type 2 diabetes mellitus with other circulatory complications: Secondary | ICD-10-CM

## 2024-11-07 DIAGNOSIS — I152 Hypertension secondary to endocrine disorders: Secondary | ICD-10-CM

## 2024-11-07 DIAGNOSIS — J069 Acute upper respiratory infection, unspecified: Secondary | ICD-10-CM

## 2024-11-07 LAB — POC COVID19 BINAXNOW: SARS Coronavirus 2 Ag: NEGATIVE

## 2024-11-07 LAB — POC INFLUENZA A&B (BINAX/QUICKVUE)
Influenza A, POC: NEGATIVE
Influenza B, POC: NEGATIVE

## 2025-01-02 ENCOUNTER — Ambulatory Visit: Admitting: Internal Medicine
